# Patient Record
Sex: Male | Born: 1945
Health system: Southern US, Community
[De-identification: ages and names within clinical notes are randomized; demographics above are authoritative.]

## PROBLEM LIST (undated history)

## (undated) DIAGNOSIS — E119 Type 2 diabetes mellitus without complications: Secondary | ICD-10-CM

## (undated) DIAGNOSIS — I10 Essential (primary) hypertension: Secondary | ICD-10-CM

## (undated) DIAGNOSIS — I2510 Atherosclerotic heart disease of native coronary artery without angina pectoris: Secondary | ICD-10-CM

## (undated) DIAGNOSIS — E039 Hypothyroidism, unspecified: Secondary | ICD-10-CM

## (undated) DIAGNOSIS — C6290 Malignant neoplasm of unspecified testis, unspecified whether descended or undescended: Secondary | ICD-10-CM

## (undated) DIAGNOSIS — E7800 Pure hypercholesterolemia, unspecified: Secondary | ICD-10-CM

## (undated) HISTORY — DX: Malignant neoplasm of unspecified testis, unspecified whether descended or undescended: C62.90

## (undated) HISTORY — PX: CORONARY ANGIOPLASTY WITH STENT PLACEMENT: SHX49

---

## 2003-12-17 ENCOUNTER — Other Ambulatory Visit: Payer: Self-pay

## 2004-08-25 ENCOUNTER — Ambulatory Visit: Payer: Self-pay | Admitting: Oncology

## 2004-08-26 ENCOUNTER — Ambulatory Visit: Payer: Self-pay | Admitting: Oncology

## 2004-09-22 ENCOUNTER — Ambulatory Visit: Payer: Self-pay | Admitting: Oncology

## 2005-01-07 ENCOUNTER — Ambulatory Visit: Payer: Self-pay | Admitting: Oncology

## 2005-01-20 ENCOUNTER — Ambulatory Visit: Payer: Self-pay | Admitting: Oncology

## 2005-01-26 ENCOUNTER — Ambulatory Visit: Payer: Self-pay

## 2005-06-03 ENCOUNTER — Ambulatory Visit: Payer: Self-pay | Admitting: Oncology

## 2005-06-22 ENCOUNTER — Ambulatory Visit: Payer: Self-pay | Admitting: Oncology

## 2005-08-30 ENCOUNTER — Ambulatory Visit: Payer: Self-pay | Admitting: Oncology

## 2005-09-08 ENCOUNTER — Ambulatory Visit: Payer: Self-pay | Admitting: Oncology

## 2005-09-17 ENCOUNTER — Ambulatory Visit: Payer: Self-pay | Admitting: Oncology

## 2005-09-22 ENCOUNTER — Ambulatory Visit: Payer: Self-pay | Admitting: Oncology

## 2005-10-05 ENCOUNTER — Ambulatory Visit: Payer: Self-pay | Admitting: Urology

## 2005-12-06 ENCOUNTER — Ambulatory Visit: Payer: Self-pay | Admitting: Oncology

## 2005-12-23 ENCOUNTER — Ambulatory Visit: Payer: Self-pay | Admitting: Oncology

## 2006-01-20 ENCOUNTER — Ambulatory Visit: Payer: Self-pay | Admitting: Oncology

## 2006-03-10 ENCOUNTER — Ambulatory Visit: Payer: Self-pay | Admitting: Oncology

## 2006-03-22 ENCOUNTER — Ambulatory Visit: Payer: Self-pay | Admitting: Oncology

## 2006-05-24 ENCOUNTER — Ambulatory Visit: Payer: Self-pay | Admitting: Oncology

## 2006-06-22 ENCOUNTER — Ambulatory Visit: Payer: Self-pay | Admitting: Oncology

## 2006-08-02 ENCOUNTER — Ambulatory Visit: Payer: Self-pay | Admitting: Oncology

## 2006-08-04 ENCOUNTER — Ambulatory Visit: Payer: Self-pay | Admitting: Oncology

## 2006-08-22 ENCOUNTER — Ambulatory Visit: Payer: Self-pay | Admitting: Oncology

## 2006-11-03 ENCOUNTER — Ambulatory Visit: Payer: Self-pay | Admitting: Oncology

## 2006-11-22 ENCOUNTER — Ambulatory Visit: Payer: Self-pay | Admitting: Oncology

## 2007-01-24 ENCOUNTER — Ambulatory Visit: Payer: Self-pay | Admitting: Internal Medicine

## 2007-02-01 ENCOUNTER — Ambulatory Visit: Payer: Self-pay | Admitting: Internal Medicine

## 2007-02-21 ENCOUNTER — Ambulatory Visit: Payer: Self-pay | Admitting: Internal Medicine

## 2007-05-23 ENCOUNTER — Ambulatory Visit: Payer: Self-pay | Admitting: Internal Medicine

## 2007-05-30 ENCOUNTER — Ambulatory Visit: Payer: Self-pay | Admitting: Internal Medicine

## 2007-06-23 ENCOUNTER — Ambulatory Visit: Payer: Self-pay | Admitting: Internal Medicine

## 2007-08-23 ENCOUNTER — Ambulatory Visit: Payer: Self-pay | Admitting: Internal Medicine

## 2007-08-30 ENCOUNTER — Ambulatory Visit: Payer: Self-pay | Admitting: Internal Medicine

## 2007-09-23 ENCOUNTER — Ambulatory Visit: Payer: Self-pay | Admitting: Internal Medicine

## 2007-10-23 ENCOUNTER — Ambulatory Visit: Payer: Self-pay | Admitting: Internal Medicine

## 2007-11-23 ENCOUNTER — Ambulatory Visit: Payer: Self-pay | Admitting: Internal Medicine

## 2007-11-28 ENCOUNTER — Ambulatory Visit: Payer: Self-pay | Admitting: Internal Medicine

## 2007-12-24 ENCOUNTER — Ambulatory Visit: Payer: Self-pay | Admitting: Internal Medicine

## 2008-02-21 ENCOUNTER — Ambulatory Visit: Payer: Self-pay | Admitting: Internal Medicine

## 2008-02-27 ENCOUNTER — Ambulatory Visit: Payer: Self-pay | Admitting: Internal Medicine

## 2008-03-22 ENCOUNTER — Ambulatory Visit: Payer: Self-pay | Admitting: Internal Medicine

## 2008-05-22 ENCOUNTER — Ambulatory Visit: Payer: Self-pay | Admitting: Internal Medicine

## 2008-06-04 ENCOUNTER — Ambulatory Visit: Payer: Self-pay | Admitting: Internal Medicine

## 2008-06-22 ENCOUNTER — Ambulatory Visit: Payer: Self-pay | Admitting: Internal Medicine

## 2008-12-23 ENCOUNTER — Ambulatory Visit: Payer: Self-pay | Admitting: Internal Medicine

## 2009-01-08 ENCOUNTER — Ambulatory Visit: Payer: Self-pay | Admitting: Internal Medicine

## 2009-01-20 ENCOUNTER — Ambulatory Visit: Payer: Self-pay | Admitting: Internal Medicine

## 2009-06-22 ENCOUNTER — Ambulatory Visit: Payer: Self-pay | Admitting: Internal Medicine

## 2009-07-08 ENCOUNTER — Ambulatory Visit: Payer: Self-pay | Admitting: Internal Medicine

## 2009-07-23 ENCOUNTER — Ambulatory Visit: Payer: Self-pay | Admitting: Internal Medicine

## 2009-12-23 ENCOUNTER — Ambulatory Visit: Payer: Self-pay | Admitting: Internal Medicine

## 2010-01-09 ENCOUNTER — Ambulatory Visit: Payer: Self-pay | Admitting: Internal Medicine

## 2010-01-20 ENCOUNTER — Ambulatory Visit: Payer: Self-pay | Admitting: Internal Medicine

## 2010-02-20 ENCOUNTER — Ambulatory Visit: Payer: Self-pay | Admitting: Internal Medicine

## 2010-07-23 ENCOUNTER — Ambulatory Visit: Payer: Self-pay | Admitting: Internal Medicine

## 2010-08-14 ENCOUNTER — Ambulatory Visit: Payer: Self-pay | Admitting: Internal Medicine

## 2010-08-22 ENCOUNTER — Ambulatory Visit: Payer: Self-pay | Admitting: Internal Medicine

## 2010-08-28 LAB — AFP TUMOR MARKER: AFP-Tumor Marker: 1.5 ng/mL (ref 0.0–8.3)

## 2010-09-18 ENCOUNTER — Ambulatory Visit: Payer: Self-pay

## 2010-09-25 ENCOUNTER — Ambulatory Visit: Payer: Self-pay

## 2010-11-20 ENCOUNTER — Ambulatory Visit: Payer: Self-pay | Admitting: Internal Medicine

## 2010-11-22 ENCOUNTER — Ambulatory Visit: Payer: Self-pay | Admitting: Internal Medicine

## 2010-12-23 ENCOUNTER — Ambulatory Visit: Payer: Self-pay | Admitting: Internal Medicine

## 2011-05-24 ENCOUNTER — Emergency Department: Payer: Self-pay | Admitting: Emergency Medicine

## 2011-05-28 ENCOUNTER — Ambulatory Visit: Payer: Self-pay | Admitting: Internal Medicine

## 2011-05-29 LAB — AFP TUMOR MARKER: AFP-Tumor Marker: 1.5 ng/mL (ref 0.0–8.3)

## 2011-05-29 LAB — BETA HCG QUANT (REF LAB)

## 2011-06-23 ENCOUNTER — Ambulatory Visit: Payer: Self-pay | Admitting: Internal Medicine

## 2011-07-27 ENCOUNTER — Encounter: Payer: Self-pay | Admitting: Orthopedic Surgery

## 2011-08-23 ENCOUNTER — Encounter: Payer: Self-pay | Admitting: Orthopedic Surgery

## 2011-08-27 ENCOUNTER — Ambulatory Visit: Payer: Self-pay | Admitting: Orthopedic Surgery

## 2011-09-23 ENCOUNTER — Encounter: Payer: Self-pay | Admitting: Orthopedic Surgery

## 2011-12-03 ENCOUNTER — Ambulatory Visit: Payer: Self-pay | Admitting: Internal Medicine

## 2011-12-03 LAB — LACTATE DEHYDROGENASE: LDH: 159 U/L (ref 87–241)

## 2011-12-03 LAB — COMPREHENSIVE METABOLIC PANEL
Alkaline Phosphatase: 83 U/L (ref 50–136)
Anion Gap: 7 (ref 7–16)
BUN: 14 mg/dL (ref 7–18)
Calcium, Total: 8.9 mg/dL (ref 8.5–10.1)
Chloride: 104 mmol/L (ref 98–107)
Co2: 29 mmol/L (ref 21–32)
EGFR (African American): >60
EGFR (Non-African Amer.): >60
Osmolality: 280 (ref 275–301)
Potassium: 4.1 mmol/L (ref 3.5–5.1)
SGOT(AST): 17 U/L (ref 15–37)
SGPT (ALT): 23 U/L

## 2011-12-03 LAB — CBC CANCER CENTER
Basophil #: 0 x10 3/mm (ref 0.0–0.1)
Basophil %: 0.4 %
Eosinophil %: 2.2 %
HCT: 39.6 % — ABNORMAL LOW (ref 40.0–52.0)
HGB: 13.6 g/dL (ref 13.0–18.0)
MCH: 31.3 pg (ref 26.0–34.0)
MCV: 91 fL (ref 80–100)
Monocyte #: 0.5 x10 3/mm (ref 0.0–0.7)
Neutrophil #: 4.7 x10 3/mm (ref 1.4–6.5)
Neutrophil %: 64.6 %
RBC: 4.35 10*6/uL — ABNORMAL LOW (ref 4.40–5.90)
RDW: 14.2 % (ref 11.5–14.5)
WBC: 7.3 x10 3/mm (ref 3.8–10.6)

## 2011-12-04 LAB — BETA HCG QUANT (REF LAB)

## 2011-12-24 ENCOUNTER — Ambulatory Visit: Payer: Self-pay | Admitting: Internal Medicine

## 2014-11-06 ENCOUNTER — Ambulatory Visit: Payer: Self-pay | Admitting: Hematology and Oncology

## 2014-11-20 ENCOUNTER — Ambulatory Visit: Payer: Self-pay | Admitting: Hematology and Oncology

## 2014-11-20 LAB — COMPREHENSIVE METABOLIC PANEL
ALBUMIN: 3.6 g/dL (ref 3.4–5.0)
ALK PHOS: 78 U/L
AST: 25 U/L (ref 15–37)
Anion Gap: 9 (ref 7–16)
BILIRUBIN TOTAL: 0.5 mg/dL (ref 0.2–1.0)
BUN: 11 mg/dL (ref 7–18)
Calcium, Total: 8.4 mg/dL — ABNORMAL LOW (ref 8.5–10.1)
Chloride: 101 mmol/L (ref 98–107)
Co2: 30 mmol/L (ref 21–32)
Creatinine: 0.9 mg/dL (ref 0.60–1.30)
EGFR (African American): >60
EGFR (Non-African Amer.): >60
Glucose: 148 mg/dL — ABNORMAL HIGH (ref 65–99)
Osmolality: 282 (ref 275–301)
POTASSIUM: 4 mmol/L (ref 3.5–5.1)
SGPT (ALT): 36 U/L
Sodium: 140 mmol/L (ref 136–145)
Total Protein: 7.4 g/dL (ref 6.4–8.2)

## 2014-11-20 LAB — LACTATE DEHYDROGENASE: LDH: 160 U/L (ref 85–241)

## 2014-11-20 LAB — CBC CANCER CENTER
Basophil #: 0 x10 3/mm (ref 0.0–0.1)
Basophil %: 0.7 %
Eosinophil #: 0.2 x10 3/mm (ref 0.0–0.7)
Eosinophil %: 3.7 %
HCT: 41.4 % (ref 40.0–52.0)
HGB: 13.5 g/dL (ref 13.0–18.0)
LYMPHS ABS: 2.1 x10 3/mm (ref 1.0–3.6)
Lymphocyte %: 33 %
MCH: 29.5 pg (ref 26.0–34.0)
MCHC: 32.6 g/dL (ref 32.0–36.0)
MCV: 91 fL (ref 80–100)
MONO ABS: 0.7 x10 3/mm (ref 0.2–1.0)
Monocyte %: 10.4 %
NEUTROS ABS: 3.3 x10 3/mm (ref 1.4–6.5)
Neutrophil %: 52.2 %
Platelet: 203 x10 3/mm (ref 150–440)
RBC: 4.57 10*6/uL (ref 4.40–5.90)
RDW: 14.5 % (ref 11.5–14.5)
WBC: 6.3 x10 3/mm (ref 3.8–10.6)

## 2014-11-20 LAB — TSH: Thyroid Stimulating Horm: 5.04 u[IU]/mL — ABNORMAL HIGH

## 2014-11-22 ENCOUNTER — Ambulatory Visit: Payer: Self-pay | Admitting: Hematology and Oncology

## 2014-11-22 LAB — PSA: PSA: 0.5 ng/mL (ref 0.0–4.0)

## 2014-11-22 LAB — AFP TUMOR MARKER: AFP-Tumor Marker: 1.9 ng/mL (ref 0.0–8.3)

## 2014-11-22 LAB — BETA HCG QUANT (REF LAB)

## 2014-12-23 ENCOUNTER — Ambulatory Visit: Payer: Self-pay | Admitting: Hematology and Oncology

## 2015-09-25 DIAGNOSIS — H5210 Myopia, unspecified eye: Secondary | ICD-10-CM | POA: Diagnosis not present

## 2015-09-25 DIAGNOSIS — H524 Presbyopia: Secondary | ICD-10-CM | POA: Diagnosis not present

## 2015-11-06 DIAGNOSIS — E039 Hypothyroidism, unspecified: Secondary | ICD-10-CM | POA: Diagnosis not present

## 2015-11-06 DIAGNOSIS — I10 Essential (primary) hypertension: Secondary | ICD-10-CM | POA: Diagnosis not present

## 2015-11-06 DIAGNOSIS — E119 Type 2 diabetes mellitus without complications: Secondary | ICD-10-CM | POA: Diagnosis not present

## 2015-11-06 DIAGNOSIS — I2510 Atherosclerotic heart disease of native coronary artery without angina pectoris: Secondary | ICD-10-CM | POA: Diagnosis not present

## 2015-11-06 DIAGNOSIS — J418 Mixed simple and mucopurulent chronic bronchitis: Secondary | ICD-10-CM | POA: Diagnosis not present

## 2015-11-13 DIAGNOSIS — I10 Essential (primary) hypertension: Secondary | ICD-10-CM | POA: Diagnosis not present

## 2015-11-13 DIAGNOSIS — E119 Type 2 diabetes mellitus without complications: Secondary | ICD-10-CM | POA: Diagnosis not present

## 2015-11-13 DIAGNOSIS — E7800 Pure hypercholesterolemia, unspecified: Secondary | ICD-10-CM | POA: Diagnosis not present

## 2015-11-13 DIAGNOSIS — I2510 Atherosclerotic heart disease of native coronary artery without angina pectoris: Secondary | ICD-10-CM | POA: Diagnosis not present

## 2015-11-13 DIAGNOSIS — J418 Mixed simple and mucopurulent chronic bronchitis: Secondary | ICD-10-CM | POA: Diagnosis not present

## 2016-04-21 DIAGNOSIS — E119 Type 2 diabetes mellitus without complications: Secondary | ICD-10-CM | POA: Diagnosis not present

## 2016-04-21 DIAGNOSIS — I2510 Atherosclerotic heart disease of native coronary artery without angina pectoris: Secondary | ICD-10-CM | POA: Diagnosis not present

## 2016-04-21 DIAGNOSIS — E7800 Pure hypercholesterolemia, unspecified: Secondary | ICD-10-CM | POA: Diagnosis not present

## 2016-04-21 DIAGNOSIS — J418 Mixed simple and mucopurulent chronic bronchitis: Secondary | ICD-10-CM | POA: Diagnosis not present

## 2016-04-21 DIAGNOSIS — I10 Essential (primary) hypertension: Secondary | ICD-10-CM | POA: Diagnosis not present

## 2016-04-28 DIAGNOSIS — E7800 Pure hypercholesterolemia, unspecified: Secondary | ICD-10-CM | POA: Diagnosis not present

## 2016-04-28 DIAGNOSIS — E119 Type 2 diabetes mellitus without complications: Secondary | ICD-10-CM | POA: Diagnosis not present

## 2016-04-28 DIAGNOSIS — J418 Mixed simple and mucopurulent chronic bronchitis: Secondary | ICD-10-CM | POA: Diagnosis not present

## 2016-04-28 DIAGNOSIS — I10 Essential (primary) hypertension: Secondary | ICD-10-CM | POA: Diagnosis not present

## 2016-04-28 DIAGNOSIS — J432 Centrilobular emphysema: Secondary | ICD-10-CM | POA: Diagnosis not present

## 2016-04-28 DIAGNOSIS — R739 Hyperglycemia, unspecified: Secondary | ICD-10-CM | POA: Diagnosis not present

## 2016-04-28 DIAGNOSIS — I2510 Atherosclerotic heart disease of native coronary artery without angina pectoris: Secondary | ICD-10-CM | POA: Diagnosis not present

## 2016-04-28 DIAGNOSIS — Z0000 Encounter for general adult medical examination without abnormal findings: Secondary | ICD-10-CM | POA: Diagnosis not present

## 2016-04-28 DIAGNOSIS — Z6834 Body mass index (BMI) 34.0-34.9, adult: Secondary | ICD-10-CM | POA: Diagnosis not present

## 2016-06-29 DIAGNOSIS — D225 Melanocytic nevi of trunk: Secondary | ICD-10-CM | POA: Diagnosis not present

## 2016-06-29 DIAGNOSIS — Z85828 Personal history of other malignant neoplasm of skin: Secondary | ICD-10-CM | POA: Diagnosis not present

## 2016-06-29 DIAGNOSIS — D2272 Melanocytic nevi of left lower limb, including hip: Secondary | ICD-10-CM | POA: Diagnosis not present

## 2016-06-29 DIAGNOSIS — D2261 Melanocytic nevi of right upper limb, including shoulder: Secondary | ICD-10-CM | POA: Diagnosis not present

## 2016-06-29 DIAGNOSIS — L570 Actinic keratosis: Secondary | ICD-10-CM | POA: Diagnosis not present

## 2016-06-29 DIAGNOSIS — X32XXXA Exposure to sunlight, initial encounter: Secondary | ICD-10-CM | POA: Diagnosis not present

## 2016-08-23 DIAGNOSIS — I10 Essential (primary) hypertension: Secondary | ICD-10-CM | POA: Diagnosis not present

## 2016-08-23 DIAGNOSIS — E7800 Pure hypercholesterolemia, unspecified: Secondary | ICD-10-CM | POA: Diagnosis not present

## 2016-08-23 DIAGNOSIS — E119 Type 2 diabetes mellitus without complications: Secondary | ICD-10-CM | POA: Diagnosis not present

## 2016-08-23 DIAGNOSIS — R739 Hyperglycemia, unspecified: Secondary | ICD-10-CM | POA: Diagnosis not present

## 2016-08-23 DIAGNOSIS — J418 Mixed simple and mucopurulent chronic bronchitis: Secondary | ICD-10-CM | POA: Diagnosis not present

## 2016-08-23 DIAGNOSIS — I2510 Atherosclerotic heart disease of native coronary artery without angina pectoris: Secondary | ICD-10-CM | POA: Diagnosis not present

## 2016-08-23 DIAGNOSIS — Z0000 Encounter for general adult medical examination without abnormal findings: Secondary | ICD-10-CM | POA: Diagnosis not present

## 2016-08-30 DIAGNOSIS — R0602 Shortness of breath: Secondary | ICD-10-CM | POA: Diagnosis not present

## 2016-08-30 DIAGNOSIS — C6211 Malignant neoplasm of descended right testis: Secondary | ICD-10-CM | POA: Diagnosis not present

## 2016-08-30 DIAGNOSIS — E119 Type 2 diabetes mellitus without complications: Secondary | ICD-10-CM | POA: Diagnosis not present

## 2016-08-30 DIAGNOSIS — I10 Essential (primary) hypertension: Secondary | ICD-10-CM | POA: Diagnosis not present

## 2016-08-30 DIAGNOSIS — E7800 Pure hypercholesterolemia, unspecified: Secondary | ICD-10-CM | POA: Diagnosis not present

## 2016-08-30 DIAGNOSIS — I2510 Atherosclerotic heart disease of native coronary artery without angina pectoris: Secondary | ICD-10-CM | POA: Diagnosis not present

## 2016-09-14 DIAGNOSIS — R0602 Shortness of breath: Secondary | ICD-10-CM | POA: Diagnosis not present

## 2016-09-14 DIAGNOSIS — I10 Essential (primary) hypertension: Secondary | ICD-10-CM | POA: Diagnosis not present

## 2016-09-14 DIAGNOSIS — J410 Simple chronic bronchitis: Secondary | ICD-10-CM | POA: Diagnosis not present

## 2016-09-14 DIAGNOSIS — I493 Ventricular premature depolarization: Secondary | ICD-10-CM | POA: Diagnosis not present

## 2016-09-14 DIAGNOSIS — Z9889 Other specified postprocedural states: Secondary | ICD-10-CM | POA: Diagnosis not present

## 2016-09-14 DIAGNOSIS — I2510 Atherosclerotic heart disease of native coronary artery without angina pectoris: Secondary | ICD-10-CM | POA: Diagnosis not present

## 2016-09-14 DIAGNOSIS — R079 Chest pain, unspecified: Secondary | ICD-10-CM | POA: Diagnosis not present

## 2016-10-06 DIAGNOSIS — R0602 Shortness of breath: Secondary | ICD-10-CM | POA: Diagnosis not present

## 2016-10-06 DIAGNOSIS — R079 Chest pain, unspecified: Secondary | ICD-10-CM | POA: Diagnosis not present

## 2016-10-06 DIAGNOSIS — I2510 Atherosclerotic heart disease of native coronary artery without angina pectoris: Secondary | ICD-10-CM | POA: Diagnosis not present

## 2016-10-08 DIAGNOSIS — E119 Type 2 diabetes mellitus without complications: Secondary | ICD-10-CM | POA: Diagnosis not present

## 2016-10-08 DIAGNOSIS — I10 Essential (primary) hypertension: Secondary | ICD-10-CM | POA: Diagnosis not present

## 2016-10-08 DIAGNOSIS — R0602 Shortness of breath: Secondary | ICD-10-CM | POA: Diagnosis not present

## 2016-10-08 DIAGNOSIS — J410 Simple chronic bronchitis: Secondary | ICD-10-CM | POA: Diagnosis not present

## 2016-10-08 DIAGNOSIS — I493 Ventricular premature depolarization: Secondary | ICD-10-CM | POA: Diagnosis not present

## 2016-10-08 DIAGNOSIS — E7800 Pure hypercholesterolemia, unspecified: Secondary | ICD-10-CM | POA: Diagnosis not present

## 2016-10-08 DIAGNOSIS — Z9889 Other specified postprocedural states: Secondary | ICD-10-CM | POA: Diagnosis not present

## 2016-10-08 DIAGNOSIS — I2510 Atherosclerotic heart disease of native coronary artery without angina pectoris: Secondary | ICD-10-CM | POA: Diagnosis not present

## 2016-12-27 DIAGNOSIS — R0602 Shortness of breath: Secondary | ICD-10-CM | POA: Diagnosis not present

## 2016-12-27 DIAGNOSIS — E7800 Pure hypercholesterolemia, unspecified: Secondary | ICD-10-CM | POA: Diagnosis not present

## 2016-12-27 DIAGNOSIS — I10 Essential (primary) hypertension: Secondary | ICD-10-CM | POA: Diagnosis not present

## 2016-12-27 DIAGNOSIS — I2510 Atherosclerotic heart disease of native coronary artery without angina pectoris: Secondary | ICD-10-CM | POA: Diagnosis not present

## 2016-12-27 DIAGNOSIS — E119 Type 2 diabetes mellitus without complications: Secondary | ICD-10-CM | POA: Diagnosis not present

## 2016-12-27 DIAGNOSIS — C6211 Malignant neoplasm of descended right testis: Secondary | ICD-10-CM | POA: Diagnosis not present

## 2017-01-03 DIAGNOSIS — I2510 Atherosclerotic heart disease of native coronary artery without angina pectoris: Secondary | ICD-10-CM | POA: Diagnosis not present

## 2017-01-03 DIAGNOSIS — E7800 Pure hypercholesterolemia, unspecified: Secondary | ICD-10-CM | POA: Diagnosis not present

## 2017-01-03 DIAGNOSIS — I493 Ventricular premature depolarization: Secondary | ICD-10-CM | POA: Diagnosis not present

## 2017-01-03 DIAGNOSIS — R079 Chest pain, unspecified: Secondary | ICD-10-CM | POA: Diagnosis not present

## 2017-01-03 DIAGNOSIS — R0602 Shortness of breath: Secondary | ICD-10-CM | POA: Diagnosis not present

## 2017-01-03 DIAGNOSIS — Z9889 Other specified postprocedural states: Secondary | ICD-10-CM | POA: Diagnosis not present

## 2017-01-03 DIAGNOSIS — Z0000 Encounter for general adult medical examination without abnormal findings: Secondary | ICD-10-CM | POA: Diagnosis not present

## 2017-01-03 DIAGNOSIS — E119 Type 2 diabetes mellitus without complications: Secondary | ICD-10-CM | POA: Diagnosis not present

## 2017-01-03 DIAGNOSIS — I10 Essential (primary) hypertension: Secondary | ICD-10-CM | POA: Diagnosis not present

## 2017-01-03 DIAGNOSIS — C6211 Malignant neoplasm of descended right testis: Secondary | ICD-10-CM | POA: Diagnosis not present

## 2017-01-03 DIAGNOSIS — J410 Simple chronic bronchitis: Secondary | ICD-10-CM | POA: Diagnosis not present

## 2017-01-03 DIAGNOSIS — D649 Anemia, unspecified: Secondary | ICD-10-CM | POA: Diagnosis not present

## 2017-05-12 DIAGNOSIS — E119 Type 2 diabetes mellitus without complications: Secondary | ICD-10-CM | POA: Diagnosis not present

## 2017-05-12 DIAGNOSIS — E7800 Pure hypercholesterolemia, unspecified: Secondary | ICD-10-CM | POA: Diagnosis not present

## 2017-05-12 DIAGNOSIS — C6211 Malignant neoplasm of descended right testis: Secondary | ICD-10-CM | POA: Diagnosis not present

## 2017-05-12 DIAGNOSIS — I10 Essential (primary) hypertension: Secondary | ICD-10-CM | POA: Diagnosis not present

## 2017-05-12 DIAGNOSIS — I2510 Atherosclerotic heart disease of native coronary artery without angina pectoris: Secondary | ICD-10-CM | POA: Diagnosis not present

## 2017-05-12 DIAGNOSIS — Z0000 Encounter for general adult medical examination without abnormal findings: Secondary | ICD-10-CM | POA: Diagnosis not present

## 2017-05-19 DIAGNOSIS — I493 Ventricular premature depolarization: Secondary | ICD-10-CM | POA: Diagnosis not present

## 2017-05-19 DIAGNOSIS — I10 Essential (primary) hypertension: Secondary | ICD-10-CM | POA: Diagnosis not present

## 2017-05-19 DIAGNOSIS — I2510 Atherosclerotic heart disease of native coronary artery without angina pectoris: Secondary | ICD-10-CM | POA: Diagnosis not present

## 2017-05-19 DIAGNOSIS — E7800 Pure hypercholesterolemia, unspecified: Secondary | ICD-10-CM | POA: Diagnosis not present

## 2017-05-19 DIAGNOSIS — Z9889 Other specified postprocedural states: Secondary | ICD-10-CM | POA: Diagnosis not present

## 2017-05-19 DIAGNOSIS — E119 Type 2 diabetes mellitus without complications: Secondary | ICD-10-CM | POA: Diagnosis not present

## 2017-05-19 DIAGNOSIS — R0602 Shortness of breath: Secondary | ICD-10-CM | POA: Diagnosis not present

## 2017-05-19 DIAGNOSIS — C6211 Malignant neoplasm of descended right testis: Secondary | ICD-10-CM | POA: Diagnosis not present

## 2017-05-19 DIAGNOSIS — E039 Hypothyroidism, unspecified: Secondary | ICD-10-CM | POA: Diagnosis not present

## 2017-06-29 DIAGNOSIS — L570 Actinic keratosis: Secondary | ICD-10-CM | POA: Diagnosis not present

## 2017-06-29 DIAGNOSIS — D2272 Melanocytic nevi of left lower limb, including hip: Secondary | ICD-10-CM | POA: Diagnosis not present

## 2017-06-29 DIAGNOSIS — D2261 Melanocytic nevi of right upper limb, including shoulder: Secondary | ICD-10-CM | POA: Diagnosis not present

## 2017-06-29 DIAGNOSIS — X32XXXA Exposure to sunlight, initial encounter: Secondary | ICD-10-CM | POA: Diagnosis not present

## 2017-06-29 DIAGNOSIS — Z85828 Personal history of other malignant neoplasm of skin: Secondary | ICD-10-CM | POA: Diagnosis not present

## 2017-06-29 DIAGNOSIS — D225 Melanocytic nevi of trunk: Secondary | ICD-10-CM | POA: Diagnosis not present

## 2017-11-04 DIAGNOSIS — I10 Essential (primary) hypertension: Secondary | ICD-10-CM | POA: Diagnosis not present

## 2017-11-04 DIAGNOSIS — I2510 Atherosclerotic heart disease of native coronary artery without angina pectoris: Secondary | ICD-10-CM | POA: Diagnosis not present

## 2017-11-04 DIAGNOSIS — Z125 Encounter for screening for malignant neoplasm of prostate: Secondary | ICD-10-CM | POA: Diagnosis not present

## 2017-11-04 DIAGNOSIS — E119 Type 2 diabetes mellitus without complications: Secondary | ICD-10-CM | POA: Diagnosis not present

## 2017-11-04 DIAGNOSIS — C6211 Malignant neoplasm of descended right testis: Secondary | ICD-10-CM | POA: Diagnosis not present

## 2017-11-04 DIAGNOSIS — E7800 Pure hypercholesterolemia, unspecified: Secondary | ICD-10-CM | POA: Diagnosis not present

## 2017-11-11 DIAGNOSIS — E039 Hypothyroidism, unspecified: Secondary | ICD-10-CM | POA: Diagnosis not present

## 2017-11-11 DIAGNOSIS — J410 Simple chronic bronchitis: Secondary | ICD-10-CM | POA: Diagnosis not present

## 2017-11-11 DIAGNOSIS — D649 Anemia, unspecified: Secondary | ICD-10-CM | POA: Diagnosis not present

## 2017-11-11 DIAGNOSIS — Z9889 Other specified postprocedural states: Secondary | ICD-10-CM | POA: Diagnosis not present

## 2017-11-11 DIAGNOSIS — E7800 Pure hypercholesterolemia, unspecified: Secondary | ICD-10-CM | POA: Diagnosis not present

## 2017-11-11 DIAGNOSIS — Z0000 Encounter for general adult medical examination without abnormal findings: Secondary | ICD-10-CM | POA: Diagnosis not present

## 2017-11-11 DIAGNOSIS — I2510 Atherosclerotic heart disease of native coronary artery without angina pectoris: Secondary | ICD-10-CM | POA: Diagnosis not present

## 2017-11-11 DIAGNOSIS — R0602 Shortness of breath: Secondary | ICD-10-CM | POA: Diagnosis not present

## 2017-11-11 DIAGNOSIS — E119 Type 2 diabetes mellitus without complications: Secondary | ICD-10-CM | POA: Diagnosis not present

## 2017-11-11 DIAGNOSIS — I493 Ventricular premature depolarization: Secondary | ICD-10-CM | POA: Diagnosis not present

## 2017-11-11 DIAGNOSIS — I10 Essential (primary) hypertension: Secondary | ICD-10-CM | POA: Diagnosis not present

## 2018-05-05 DIAGNOSIS — D649 Anemia, unspecified: Secondary | ICD-10-CM | POA: Diagnosis not present

## 2018-05-05 DIAGNOSIS — E7800 Pure hypercholesterolemia, unspecified: Secondary | ICD-10-CM | POA: Diagnosis not present

## 2018-05-05 DIAGNOSIS — I10 Essential (primary) hypertension: Secondary | ICD-10-CM | POA: Diagnosis not present

## 2018-05-05 DIAGNOSIS — E039 Hypothyroidism, unspecified: Secondary | ICD-10-CM | POA: Diagnosis not present

## 2018-05-05 DIAGNOSIS — E119 Type 2 diabetes mellitus without complications: Secondary | ICD-10-CM | POA: Diagnosis not present

## 2018-05-05 DIAGNOSIS — J410 Simple chronic bronchitis: Secondary | ICD-10-CM | POA: Diagnosis not present

## 2018-05-15 DIAGNOSIS — E119 Type 2 diabetes mellitus without complications: Secondary | ICD-10-CM | POA: Diagnosis not present

## 2018-05-15 DIAGNOSIS — E039 Hypothyroidism, unspecified: Secondary | ICD-10-CM | POA: Diagnosis not present

## 2018-05-15 DIAGNOSIS — E7800 Pure hypercholesterolemia, unspecified: Secondary | ICD-10-CM | POA: Diagnosis not present

## 2018-05-15 DIAGNOSIS — I493 Ventricular premature depolarization: Secondary | ICD-10-CM | POA: Diagnosis not present

## 2018-05-15 DIAGNOSIS — I2510 Atherosclerotic heart disease of native coronary artery without angina pectoris: Secondary | ICD-10-CM | POA: Diagnosis not present

## 2018-05-15 DIAGNOSIS — Z9889 Other specified postprocedural states: Secondary | ICD-10-CM | POA: Diagnosis not present

## 2018-05-15 DIAGNOSIS — J410 Simple chronic bronchitis: Secondary | ICD-10-CM | POA: Diagnosis not present

## 2018-05-15 DIAGNOSIS — Z0000 Encounter for general adult medical examination without abnormal findings: Secondary | ICD-10-CM | POA: Diagnosis not present

## 2018-05-15 DIAGNOSIS — I10 Essential (primary) hypertension: Secondary | ICD-10-CM | POA: Diagnosis not present

## 2018-05-15 DIAGNOSIS — C6211 Malignant neoplasm of descended right testis: Secondary | ICD-10-CM | POA: Diagnosis not present

## 2018-05-15 DIAGNOSIS — R0602 Shortness of breath: Secondary | ICD-10-CM | POA: Diagnosis not present

## 2018-06-15 ENCOUNTER — Inpatient Hospital Stay
Admission: EM | Admit: 2018-06-15 | Discharge: 2018-06-17 | DRG: 246 | Disposition: A | Discharge status: Home / Self Care | Payer: Medicare HMO | Attending: Internal Medicine | Admitting: Internal Medicine

## 2018-06-15 ENCOUNTER — Other Ambulatory Visit: Payer: Self-pay

## 2018-06-15 ENCOUNTER — Encounter: Payer: Self-pay | Admitting: Emergency Medicine

## 2018-06-15 ENCOUNTER — Emergency Department: Payer: Medicare HMO

## 2018-06-15 DIAGNOSIS — Z823 Family history of stroke: Secondary | ICD-10-CM | POA: Diagnosis not present

## 2018-06-15 DIAGNOSIS — R0789 Other chest pain: Secondary | ICD-10-CM | POA: Diagnosis not present

## 2018-06-15 DIAGNOSIS — Z8249 Family history of ischemic heart disease and other diseases of the circulatory system: Secondary | ICD-10-CM

## 2018-06-15 DIAGNOSIS — Z7982 Long term (current) use of aspirin: Secondary | ICD-10-CM

## 2018-06-15 DIAGNOSIS — E039 Hypothyroidism, unspecified: Secondary | ICD-10-CM | POA: Diagnosis present

## 2018-06-15 DIAGNOSIS — Z79899 Other long term (current) drug therapy: Secondary | ICD-10-CM

## 2018-06-15 DIAGNOSIS — Z7984 Long term (current) use of oral hypoglycemic drugs: Secondary | ICD-10-CM

## 2018-06-15 DIAGNOSIS — I200 Unstable angina: Secondary | ICD-10-CM | POA: Diagnosis not present

## 2018-06-15 DIAGNOSIS — Y831 Surgical operation with implant of artificial internal device as the cause of abnormal reaction of the patient, or of later complication, without mention of misadventure at the time of the procedure: Secondary | ICD-10-CM | POA: Diagnosis present

## 2018-06-15 DIAGNOSIS — Z87891 Personal history of nicotine dependence: Secondary | ICD-10-CM

## 2018-06-15 DIAGNOSIS — I2510 Atherosclerotic heart disease of native coronary artery without angina pectoris: Secondary | ICD-10-CM | POA: Diagnosis not present

## 2018-06-15 DIAGNOSIS — E785 Hyperlipidemia, unspecified: Secondary | ICD-10-CM | POA: Diagnosis not present

## 2018-06-15 DIAGNOSIS — Z79890 Hormone replacement therapy (postmenopausal): Secondary | ICD-10-CM | POA: Diagnosis not present

## 2018-06-15 DIAGNOSIS — I222 Subsequent non-ST elevation (NSTEMI) myocardial infarction: Secondary | ICD-10-CM | POA: Diagnosis not present

## 2018-06-15 DIAGNOSIS — Z955 Presence of coronary angioplasty implant and graft: Secondary | ICD-10-CM | POA: Diagnosis not present

## 2018-06-15 DIAGNOSIS — I10 Essential (primary) hypertension: Secondary | ICD-10-CM | POA: Diagnosis present

## 2018-06-15 DIAGNOSIS — I214 Non-ST elevation (NSTEMI) myocardial infarction: Secondary | ICD-10-CM | POA: Diagnosis not present

## 2018-06-15 DIAGNOSIS — Z9889 Other specified postprocedural states: Secondary | ICD-10-CM | POA: Diagnosis not present

## 2018-06-15 DIAGNOSIS — Z83518 Family history of other specified eye disorder: Secondary | ICD-10-CM

## 2018-06-15 DIAGNOSIS — E119 Type 2 diabetes mellitus without complications: Secondary | ICD-10-CM | POA: Diagnosis present

## 2018-06-15 DIAGNOSIS — T82855A Stenosis of coronary artery stent, initial encounter: Secondary | ICD-10-CM | POA: Diagnosis not present

## 2018-06-15 DIAGNOSIS — I493 Ventricular premature depolarization: Secondary | ICD-10-CM | POA: Diagnosis not present

## 2018-06-15 DIAGNOSIS — R079 Chest pain, unspecified: Secondary | ICD-10-CM | POA: Diagnosis not present

## 2018-06-15 DIAGNOSIS — Z9861 Coronary angioplasty status: Secondary | ICD-10-CM | POA: Diagnosis not present

## 2018-06-15 HISTORY — DX: Pure hypercholesterolemia, unspecified: E78.00

## 2018-06-15 HISTORY — DX: Atherosclerotic heart disease of native coronary artery without angina pectoris: I25.10

## 2018-06-15 HISTORY — DX: Essential (primary) hypertension: I10

## 2018-06-15 HISTORY — DX: Type 2 diabetes mellitus without complications: E11.9

## 2018-06-15 LAB — PROTIME-INR
INR: 0.91
Prothrombin Time: 12.2 seconds (ref 11.4–15.2)

## 2018-06-15 LAB — GLUCOSE, CAPILLARY
Glucose-Capillary: 177 mg/dL — ABNORMAL HIGH (ref 70–99)
Glucose-Capillary: 143 mg/dL — ABNORMAL HIGH (ref 70–99)
Glucose-Capillary: 155 mg/dL — ABNORMAL HIGH (ref 70–99)
Glucose-Capillary: 120 mg/dL — ABNORMAL HIGH (ref 70–99)

## 2018-06-15 LAB — BASIC METABOLIC PANEL
Anion gap: 8 (ref 5–15)
BUN: 21 mg/dL (ref 8–23)
CALCIUM: 9.3 mg/dL (ref 8.9–10.3)
CO2: 27 mmol/L (ref 22–32)
CREATININE: 0.9 mg/dL (ref 0.61–1.24)
Chloride: 103 mmol/L (ref 98–111)
GFR calc Af Amer: >60 mL/min (ref >60)
GFR calc non Af Amer: >60 mL/min (ref >60)
GLUCOSE: 168 mg/dL — AB (ref 70–99)
Potassium: 4.2 mmol/L (ref 3.5–5.1)
Sodium: 138 mmol/L (ref 135–145)

## 2018-06-15 LAB — CBC
HCT: 39.2 % — ABNORMAL LOW (ref 40.0–52.0)
HEMOGLOBIN: 13.3 g/dL (ref 13.0–18.0)
MCH: 30.2 pg (ref 26.0–34.0)
MCHC: 33.9 g/dL (ref 32.0–36.0)
MCV: 89.1 fL (ref 80.0–100.0)
Platelets: 207 10*3/uL (ref 150–440)
RBC: 4.4 MIL/uL (ref 4.40–5.90)
RDW: 14 % (ref 11.5–14.5)
WBC: 7.8 10*3/uL (ref 3.8–10.6)

## 2018-06-15 LAB — TROPONIN I
TROPONIN I: 0.3 ng/mL — AB (ref <0.03)
TROPONIN I: 0.29 ng/mL — AB (ref <0.03)
Troponin I: 0.17 ng/mL (ref <0.03)

## 2018-06-15 LAB — HEPARIN LEVEL (UNFRACTIONATED)
HEPARIN UNFRACTIONATED: 0.29 [IU]/mL — AB (ref 0.30–0.70)
Heparin Unfractionated: 0.35 IU/mL (ref 0.30–0.70)

## 2018-06-15 LAB — HEMOGLOBIN A1C
Hgb A1c MFr Bld: 8.8 % — ABNORMAL HIGH (ref 4.8–5.6)
Mean Plasma Glucose: 205.86 mg/dL

## 2018-06-15 LAB — MAGNESIUM: Magnesium: 1.9 mg/dL (ref 1.7–2.4)

## 2018-06-15 LAB — TSH: TSH: 6.431 u[IU]/mL — ABNORMAL HIGH (ref 0.350–4.500)

## 2018-06-15 LAB — APTT: APTT: 31 s (ref 24–36)

## 2018-06-15 MED ORDER — ACETAMINOPHEN 650 MG RE SUPP: 650.0000 mg | Freq: Four times a day (QID) | RECTAL | Status: DC | PRN

## 2018-06-15 MED ORDER — SODIUM CHLORIDE 0.9% FLUSH: 3.0000 mL | INTRAVENOUS | Status: DC | PRN

## 2018-06-15 MED ORDER — METOPROLOL SUCCINATE ER 50 MG PO TB24
50.0000 mg | ORAL_TABLET | Freq: Every day | ORAL | Status: DC
  Administered 2018-06-15: 50 mg via ORAL
  Filled 2018-06-15: qty 1

## 2018-06-15 MED ORDER — NITROGLYCERIN 2 % TD OINT
1.0000 [in_us] | TOPICAL_OINTMENT | Freq: Once | TRANSDERMAL | Status: AC
  Administered 2018-06-15: 1 [in_us] via TOPICAL
  Filled 2018-06-15: qty 1

## 2018-06-15 MED ORDER — METOPROLOL TARTRATE 25 MG PO TABS: 25.0000 mg | ORAL_TABLET | Freq: Two times a day (BID) | ORAL | Status: DC

## 2018-06-15 MED ORDER — ATORVASTATIN CALCIUM 20 MG PO TABS
40.0000 mg | ORAL_TABLET | Freq: Every day | ORAL | Status: DC
  Administered 2018-06-15: 40 mg via ORAL
  Filled 2018-06-15: qty 2

## 2018-06-15 MED ORDER — LOSARTAN POTASSIUM 50 MG PO TABS
50.0000 mg | ORAL_TABLET | Freq: Every day | ORAL | Status: DC
  Administered 2018-06-15: 50 mg via ORAL
  Filled 2018-06-15: qty 1

## 2018-06-15 MED ORDER — INSULIN ASPART 100 UNIT/ML ~~LOC~~ SOLN
0.0000 [IU] | Freq: Three times a day (TID) | SUBCUTANEOUS | Status: DC
  Administered 2018-06-15: 1 [IU] via SUBCUTANEOUS
  Administered 2018-06-15 (×2): 2 [IU] via SUBCUTANEOUS
  Administered 2018-06-16: 5 [IU] via SUBCUTANEOUS
  Administered 2018-06-17: 3 [IU] via SUBCUTANEOUS
  Filled 2018-06-15 (×5): qty 1

## 2018-06-15 MED ORDER — HYDRALAZINE HCL 20 MG/ML IJ SOLN
10.0000 mg | Freq: Four times a day (QID) | INTRAMUSCULAR | Status: DC | PRN
  Administered 2018-06-17 (×2): 10 mg via INTRAVENOUS
  Filled 2018-06-15 (×2): qty 1

## 2018-06-15 MED ORDER — HEPARIN (PORCINE) IN NACL 100-0.45 UNIT/ML-% IJ SOLN
1400.0000 [IU]/h | INTRAMUSCULAR | Status: DC
  Administered 2018-06-16: 1400 [IU]/h via INTRAVENOUS
  Filled 2018-06-15 (×2): qty 250

## 2018-06-15 MED ORDER — LEVOTHYROXINE SODIUM 75 MCG PO TABS
75.0000 ug | ORAL_TABLET | Freq: Every day | ORAL | Status: DC
  Administered 2018-06-16 – 2018-06-17 (×2): 75 ug via ORAL
  Filled 2018-06-15: qty 3
  Filled 2018-06-15 (×2): qty 1
  Filled 2018-06-15: qty 3

## 2018-06-15 MED ORDER — SODIUM CHLORIDE 0.9 % WEIGHT BASED INFUSION: 1.0000 mL/kg/h | INTRAVENOUS | Status: DC

## 2018-06-15 MED ORDER — ACETAMINOPHEN 325 MG PO TABS: 650.0000 mg | ORAL_TABLET | Freq: Four times a day (QID) | ORAL | Status: DC | PRN

## 2018-06-15 MED ORDER — SODIUM CHLORIDE 0.9 % WEIGHT BASED INFUSION
3.0000 mL/kg/h | INTRAVENOUS | Status: AC
  Administered 2018-06-16: 3 mL/kg/h via INTRAVENOUS

## 2018-06-15 MED ORDER — ONDANSETRON HCL 4 MG/2ML IJ SOLN
4.0000 mg | Freq: Four times a day (QID) | INTRAMUSCULAR | Status: DC | PRN
Start: 2018-06-15 — End: 2018-06-16

## 2018-06-15 MED ORDER — SODIUM CHLORIDE 0.9% FLUSH
3.0000 mL | Freq: Two times a day (BID) | INTRAVENOUS | Status: DC
  Administered 2018-06-15 (×2): 3 mL via INTRAVENOUS

## 2018-06-15 MED ORDER — SODIUM CHLORIDE 0.9 % IV SOLN: 250.0000 mL | INTRAVENOUS | Status: DC | PRN

## 2018-06-15 MED ORDER — DOCUSATE SODIUM 100 MG PO CAPS
100.0000 mg | ORAL_CAPSULE | Freq: Two times a day (BID) | ORAL | Status: DC
  Filled 2018-06-15 (×2): qty 1

## 2018-06-15 MED ORDER — HEPARIN SODIUM (PORCINE) 5000 UNIT/ML IJ SOLN
4000.0000 [IU] | Freq: Once | INTRAMUSCULAR | Status: AC
  Administered 2018-06-15: 4000 [IU] via INTRAVENOUS
  Filled 2018-06-15: qty 1

## 2018-06-15 MED ORDER — ASPIRIN 81 MG PO CHEW
81.0000 mg | CHEWABLE_TABLET | ORAL | Status: AC
  Filled 2018-06-15: qty 1

## 2018-06-15 MED ORDER — INSULIN ASPART 100 UNIT/ML ~~LOC~~ SOLN
0.0000 [IU] | Freq: Every day | SUBCUTANEOUS | Status: DC
  Administered 2018-06-16: 2 [IU] via SUBCUTANEOUS
  Filled 2018-06-15: qty 1

## 2018-06-15 MED ORDER — MORPHINE SULFATE (PF) 2 MG/ML IV SOLN
2.0000 mg | INTRAVENOUS | Status: DC | PRN
Start: 2018-06-15 — End: 2018-06-17
  Administered 2018-06-15 – 2018-06-17 (×3): 2 mg via INTRAVENOUS
  Filled 2018-06-15 (×3): qty 1

## 2018-06-15 MED ORDER — ASPIRIN EC 81 MG PO TBEC
81.0000 mg | DELAYED_RELEASE_TABLET | Freq: Every day | ORAL | Status: DC
  Administered 2018-06-15 – 2018-06-16 (×2): 81 mg via ORAL
  Filled 2018-06-15: qty 1

## 2018-06-15 MED ORDER — ASPIRIN 81 MG PO CHEW: 81.0000 mg | CHEWABLE_TABLET | Freq: Every day | ORAL | Status: DC

## 2018-06-15 MED ORDER — ONDANSETRON HCL 4 MG PO TABS
4.0000 mg | ORAL_TABLET | Freq: Four times a day (QID) | ORAL | Status: DC | PRN
Start: 2018-06-15 — End: 2018-06-16

## 2018-06-15 NOTE — ED Notes (Signed)
Awaiting Heparin from pharmacy.

## 2018-06-15 NOTE — H&P (Signed)
Jason Mclean. is an 72 y.o. male.   Chief Complaint: Chest pain HPI: The patient with past medical history of coronary artery disease status post PCI, diabetes and hypertension presents to the emergency department complaining of chest pain.  The patient admits that he has had pain off and on for the last few weeks.  Today he had pain while mowing his lawn albeit on his riding lawn more.  The patient took a few aspirin and rested which relieved the pain to some degree.  It was intermittent until this evening when the patient went up stairs and immediately felt a chest pressure and pain radiating down both arms.  He denies nausea, vomiting or diaphoresis.  In the emergency department he continued to have centralized chest pressure which prompted the emergency department staff to call hospitalist service for admission.  Past Medical History:  Diagnosis Date  . Coronary artery disease    s/p three coronary stents  . Diabetes mellitus without complication ([place])   . Hypercholesterolemia   . Hypertension     Past Surgical History:  Procedure Laterality Date  . CORONARY ANGIOPLASTY WITH STENT PLACEMENT      Family History  Problem Relation Age of Onset  . Strabismus Mother   . Stroke Mother   . CAD Father    Social History:  reports that he has quit smoking. He has never used smokeless tobacco. His alcohol and drug histories are not on file.  Allergies: No Known Allergies  Prior to Admission medications   Medication Sig Start Date End Date Taking? Authorizing Provider  aspirin EC 81 MG tablet Take 81 mg by mouth daily as needed.   Yes [provider]  atorvastatin (LIPITOR) 40 MG tablet Take 40 mg by mouth daily at 6 PM.   Yes [provider]  levothyroxine (SYNTHROID, LEVOTHROID) 75 MCG tablet TAKE 1 TABLET BY MOUTH ON AN EMPTY STOMACH W/A GLASS OF WATER AT LEAST 30-60 MIN BEFORE BREAKFAST. 03/28/18  Yes [provider]  losartan (COZAAR) 50 MG tablet Take 50 mg  by mouth daily. 05/29/18  Yes [provider]  metFORMIN (GLUCOPHAGE) 1000 MG tablet Take 1,000 mg by mouth 2 (two) times daily with a meal. 05/16/18  Yes [provider]  metoprolol succinate (TOPROL-XL) 50 MG 24 hr tablet Take 50 mg by mouth daily. 05/16/18  Yes [provider]  Multiple Vitamin (MULTIVITAMIN WITH MINERALS) TABS tablet Take 1 tablet by mouth daily.   Yes [provider]     Results for orders placed or performed during the hospital encounter of 06/15/18 (from the past 48 hour(s))  Basic metabolic panel     Status: Abnormal   Collection Time: 06/15/18  1:44 AM  Result Value Ref Range   Sodium 138 135 - 145 mmol/L   Potassium 4.2 3.5 - 5.1 mmol/L   Chloride 103 98 - 111 mmol/L   CO2 27 22 - 32 mmol/L   Glucose, Bld 168 (H) 70 - 99 mg/dL   BUN 21 8 - 23 mg/dL   Creatinine, Ser 0.90 0.61 - 1.24 mg/dL   Calcium 9.3 8.9 - 10.3 mg/dL   GFR calc non Af Amer >60 >60 mL/min   GFR calc Af Amer >60 >60 mL/min    Comment: (NOTE) The eGFR has been calculated using the CKD EPI equation. This calculation has not been validated in all clinical situations. eGFR's persistently <60 mL/min signify possible Chronic Kidney Disease.    Anion gap 8 5 -  15    Comment: Performed at [facility], [address] [place], [place] [address]  CBC     Status: Abnormal   Collection Time: 06/15/18  1:44 AM  Result Value Ref Range   WBC 7.8 3.8 - 10.6 K/uL   RBC 4.40 4.40 - 5.90 MIL/uL   Hemoglobin 13.3 13.0 - 18.0 g/dL   HCT 39.2 (L) 40.0 - 52.0 %   MCV 89.1 80.0 - 100.0 fL   MCH 30.2 26.0 - 34.0 pg   MCHC 33.9 32.0 - 36.0 g/dL   RDW 14.0 11.5 - 14.5 %   Platelets 207 150 - 440 K/uL    Comment: Performed at [facility], [address] [place], [place] [address]  Troponin I     Status: None   Collection Time: 06/15/18  1:44 AM  Result Value Ref Range   Troponin I <0.03 <0.03 ng/mL    Comment: Performed at [facility], [address], [place], [place] [address]  Magnesium     Status: None   Collection Time: 06/15/18  1:44 AM  Result Value Ref Range   Magnesium 1.9 1.7 - 2.4 mg/dL    Comment: Performed at [facility], [address] [place], [place] [address]  Protime-INR     Status: None   Collection Time: 06/15/18  1:44 AM  Result Value Ref Range   Prothrombin Time 12.2 11.4 - 15.2 seconds   INR 0.91     Comment: Performed at [facility], [address] [place], [place] [address]  APTT     Status: None   Collection Time: 06/15/18  1:44 AM  Result Value Ref Range   aPTT 31 24 - 36 seconds    Comment: Performed at [facility], [address] [place], [place] [address]  TSH     Status: Abnormal   Collection Time: 06/15/18  1:44 AM  Result Value Ref Range   TSH 6.431 (H) 0.350 - 4.500 uIU/mL    Comment: Performed by a 3rd Generation assay with a functional sensitivity of <=0.01 uIU/mL. Performed at [facility], [address] [place],  [address]    Dg Chest 2 View  Result Date: 06/15/2018 CLINICAL DATA:  Chest pain EXAM: CHEST - 2 VIEW COMPARISON:  05/24/2011 FINDINGS: Mild bronchitic changes at the bases. No focal opacity or pleural effusion. Borderline to mild cardiomegaly. No pneumothorax. IMPRESSION: 1. Mild bronchitic changes at the bases 2. Borderline to mild cardiomegaly. Electronically Signed   By: Donavan Foil M.D.   On: 06/15/2018 01:37    Review of Systems  Constitutional: Negative for chills and fever.  HENT: Negative for sore throat and tinnitus.   Eyes: Negative for blurred vision and redness.  Respiratory: Positive for shortness of breath. Negative for cough.   Cardiovascular: Positive for chest pain. Negative for palpitations, orthopnea and PND.  Gastrointestinal: Negative for abdominal pain, diarrhea, nausea and vomiting.  Genitourinary: Negative for dysuria, frequency and urgency.  Musculoskeletal: Negative for joint pain and  myalgias.  Skin: Negative for rash.       No lesions  Neurological: Negative for speech change, focal weakness and weakness.  Endo/Heme/Allergies: Does not bruise/bleed easily.       No temperature intolerance  Psychiatric/Behavioral: Negative for depression and suicidal ideas.    Blood pressure (!) 147/69, pulse (!) 58, temperature 98 F (36.7 C), temperature source Oral, resp. rate (!) 21, height _0  (1.753 m), weight 98.4 kg (217  lb), SpO2 96 %. Physical Exam  Constitutional: He is oriented to person, place, and time. He appears well-developed and well-nourished. No distress.  HENT:  Head: Normocephalic and atraumatic.  Mouth/Throat: Oropharynx is clear and moist.  Eyes: Pupils are equal, round, and reactive to light. Conjunctivae and EOM are normal. No scleral icterus.  Neck: Normal range of motion. Neck supple. No JVD present. No tracheal deviation present. No thyromegaly present.  Cardiovascular: Normal rate and normal heart sounds. Exam reveals no gallop and no friction rub.  No murmur heard. Respiratory: Effort normal and breath sounds normal. No respiratory distress.  GI: Soft. Bowel sounds are normal. He exhibits no distension. There is no tenderness.  Genitourinary:  Genitourinary Comments: Deferred  Musculoskeletal: Normal range of motion. He exhibits no edema.  Lymphadenopathy:    He has no cervical adenopathy.  Neurological: He is alert and oriented to person, place, and time. No cranial nerve deficit.  Skin: Skin is warm and dry. No rash noted. No erythema.  Psychiatric: He has a normal mood and affect. His behavior is normal. Judgment and thought content normal.     Assessment/Plan This is a 72 year old male admitted for NSTEMI. 1.  NSTEMI: Heparin drip, n.p.o., control heart rate with metoprolol.  Cardiology consulted for potential heart catheterization. 2.  CAD: Status post 3 stents in the past.  Last catheterization 2005.  Continue aspirin. Nitropaste applied  to the chest. 3.  Hypertension: Controlled; continue losartan. 4.  Diabetes mellitus type 2: Hold metformin; sliding scale insulin hospitalized 5.  Hyperlipidemia: Continue statin therapy 6.  Hypothyroidism: Continue Synthroid; check TSH 7.  DVT prophylaxis: There pubic anticoagulation Exline 8.  GI prophylaxis: None The patient is a full code.  Time spent on admission orders and patient care approximately 45 minutes   Harrie Foreman, MD 06/15/2018, 7:45 AM

## 2018-06-15 NOTE — Progress Notes (Signed)
Talked to Dr. Marcille Blanco about patient's complaints of 5/10 mid-sternal chest pressure radiating on his left arm. Patient has nitro patch on order, to give morphine 2 mg IV. Patient has scheduled cardiac cath tomorrow. RN will continue to monitor.

## 2018-06-15 NOTE — Progress Notes (Signed)
ANTICOAGULATION CONSULT NOTE - Initial Consult  Pharmacy Consult for heparin drip Indication: chest pain/ACS  No Known Allergies  Patient Measurements: Height: 5\' 9"  (175.3 cm) Weight: 217 lb (98.4 kg) IBW/kg (Calculated) : 70.7 Heparin Dosing Weight: 91 kg  Vital Signs: Temp: 98.3 F (36.8 C) (07/25 2053) Temp Source: Oral (07/25 1726) BP: 151/67 (07/25 2053) Pulse Rate: 59 (07/25 2053)  Labs: Recent Labs    06/15/18 0144 06/15/18 0802 06/15/18 1133 06/15/18 1328 06/15/18 1950  HGB 13.3  --   --   --   --   HCT 39.2*  --   --   --   --   PLT 207  --   --   --   --   APTT 31  --   --   --   --   LABPROT 12.2  --   --   --   --   INR 0.91  --   --   --   --   HEPARINUNFRC  --   --  0.35  --  0.29*  CREATININE 0.90  --   --   --   --   TROPONINI <0.03 0.17*  --  0.30* 0.29*    Estimated Creatinine Clearance: 88.4 mL/min (by C-G formula based on SCr of 0.9 mg/dL).   Medical History: Past Medical History:  Diagnosis Date  . Coronary artery disease    s/p three coronary stents  . Diabetes mellitus without complication ([place])   . Hypercholesterolemia   . Hypertension     Medications:  No anticoag in PTA meds  Assessment: Initial Trop < 0.03  Goal of Therapy:  Heparin level 0.3-0.7 units/ml Monitor platelets by anticoagulation protocol: Yes   Plan:  7/25 @ 1950 Heparin level subtherapeutic at 0.29. Will increase heparin drip to 1400 units/hr and recheck HL in 8 hours.  Pernell Dupre, PharmD, BCPS Clinical Pharmacist 06/15/2018 8:58 PM

## 2018-06-15 NOTE — ED Notes (Signed)
Lab called resulted a critical value, Admitting MD paged.

## 2018-06-15 NOTE — Progress Notes (Signed)
Updated Dr. Marcille Blanco about patient's that has increase 6/10, did stat EKG, then reassessed pain after 30 mins and patient asleep. RN will continue to monitor.

## 2018-06-15 NOTE — Consult Note (Signed)
Cardiology Consultation Note    Patient ID: Jason Correia., MRN: [id number], DOB/AGE: [birth year] 72 y.o. Admit date: 06/15/2018   Date of Consult: 06/15/2018 Primary Physician: Tracie Harrier, MD Primary Cardiologist: Dr. Saralyn Pilar  Chief Complaint: Chest pain Reason for Consultation: Chest pain/elevated troponin Requesting MD: Dr. Bridgett Larsson  HPI: Jason Dicenzo. is a 72 y.o. male with history of coronary artery disease status post PCI x3 with a stent in the ramus intermedius done in March of 01/10/1998, stent in the mid and distal RCA done in 2005, history of preserved LV function with a negative functional study in 2017 with an EF of 66%.  He is been treated with metoprolol succinate and losartan.  He is on atorvastatin with an LDL of 104.  He presents emergency room with complaints of chest discomfort.  His last heart cath was done 2005.  He was significantly hypertensive.  EKG showed sinus rhythm with nonspecific ST-T wave changes and PVCs.  Chest x-ray revealed bronchitic changes with no pulmonary edema.  There was borderline to mild cardiomegaly.  Initial serum troponin was normal subsequent value was 0.17.  Past Medical History:  Diagnosis Date  . Coronary artery disease    s/p three coronary stents  . Diabetes mellitus without complication ([place])   . Hypercholesterolemia   . Hypertension       Surgical History:  Past Surgical History:  Procedure Laterality Date  . CORONARY ANGIOPLASTY WITH STENT PLACEMENT       Home Meds: Prior to Admission medications   Medication Sig Start Date End Date Taking? Authorizing Provider  aspirin EC 81 MG tablet Take 81 mg by mouth daily as needed.   Yes [provider]  atorvastatin (LIPITOR) 40 MG tablet Take 40 mg by mouth daily at 6 PM.   Yes [provider]  levothyroxine (SYNTHROID, LEVOTHROID) 75 MCG tablet TAKE 1 TABLET BY MOUTH ON AN EMPTY STOMACH W/A GLASS OF WATER AT LEAST 30-60 MIN BEFORE BREAKFAST. 03/28/18  Yes  [provider]  losartan (COZAAR) 50 MG tablet Take 50 mg by mouth daily. 05/29/18  Yes [provider]  metFORMIN (GLUCOPHAGE) 1000 MG tablet Take 1,000 mg by mouth 2 (two) times daily with a meal. 05/16/18  Yes [provider]  metoprolol succinate (TOPROL-XL) 50 MG 24 hr tablet Take 50 mg by mouth daily. 05/16/18  Yes [provider]  Multiple Vitamin (MULTIVITAMIN WITH MINERALS) TABS tablet Take 1 tablet by mouth daily.   Yes [provider]    Inpatient Medications:  . [START ON 06/16/2018] aspirin  81 mg Oral Daily  . atorvastatin  40 mg Oral q1800  . docusate sodium  100 mg Oral BID  . insulin aspart  0-5 Units Subcutaneous QHS  . insulin aspart  0-9 Units Subcutaneous TID WC  . metoprolol tartrate  25 mg Oral BID   . heparin      Allergies: No Known Allergies  Social History   Socioeconomic History  . Marital status: Divorced    Spouse name: Not on file  . Number of children: Not on file  . Years of education: Not on file  . Highest education level: Not on file  Occupational History  . Not on file  Social Needs  . Financial resource strain: Not on file  . Food insecurity:    Worry: Not on file    Inability: Not on file  . Transportation needs:    Medical: Not on file  Non-medical: Not on file  Tobacco Use  . Smoking status: Former Research scientist (life sciences)  . Smokeless tobacco: Never Used  Substance and Sexual Activity  . Alcohol use: Not on file  . Drug use: Not on file  . Sexual activity: Not on file  Lifestyle  . Physical activity:    Days per week: Not on file    Minutes per session: Not on file  . Stress: Not on file  Relationships  . Social connections:    Talks on phone: Not on file    Gets together: Not on file    Attends religious service: Not on file    Active member of club or organization: Not on file    Attends meetings of clubs or organizations: Not on file    Relationship status: Not on file  . Intimate partner  violence:    Fear of current or ex partner: Not on file    Emotionally abused: Not on file    Physically abused: Not on file    Forced sexual activity: Not on file  Other Topics Concern  . Not on file  Social History Narrative  . Not on file     Family History  Problem Relation Age of Onset  . Strabismus Mother   . Stroke Mother   . CAD Father      Review of Systems: A 12-system review of systems was performed and is negative except as noted in the HPI.  Labs: Recent Labs    06/15/18 0144 06/15/18 0802  TROPONINI <0.03 0.17*   Lab Results  Component Value Date   WBC 7.8 06/15/2018   HGB 13.3 06/15/2018   HCT 39.2 (L) 06/15/2018   MCV 89.1 06/15/2018   PLT 207 06/15/2018    Recent Labs  Lab 06/15/18 0144  NA 138  K 4.2  CL 103  CO2 27  BUN 21  CREATININE 0.90  CALCIUM 9.3  GLUCOSE 168*   No results found for: CHOL, HDL, LDLCALC, TRIG No results found for: DDIMER  Radiology/Studies:  Dg Chest 2 View  Result Date: 06/15/2018 CLINICAL DATA:  Chest pain EXAM: CHEST - 2 VIEW COMPARISON:  05/24/2011 FINDINGS: Mild bronchitic changes at the bases. No focal opacity or pleural effusion. Borderline to mild cardiomegaly. No pneumothorax. IMPRESSION: 1. Mild bronchitic changes at the bases 2. Borderline to mild cardiomegaly. Electronically Signed   By: Donavan Foil M.D.   On: 06/15/2018 01:37    Wt Readings from Last 3 Encounters:  06/15/18 98.4 kg (217 lb)    EKG: Normal sinus rhythm with delayed R wave progression across the precordium.  Physical Exam:  Blood pressure (!) 181/79, pulse 64, temperature 98 F (36.7 C), temperature source Oral, resp. rate 15, height 5\' 9"  (1.753 m), weight 98.4 kg (217 lb), SpO2 99 %. Body mass index is 32.05 kg/m. General: Well developed, well nourished, in no acute distress. Head: Normocephalic, atraumatic, sclera non-icteric, no xanthomas, nares are without discharge.  Neck: Negative for carotid bruits. JVD not  elevated. Lungs: Clear bilaterally to auscultation without wheezes, rales, or rhonchi. Breathing is unlabored. Heart: RRR with S1 S2. No murmurs, rubs, or gallops appreciated. Abdomen: Soft, non-tender, non-distended with normoactive bowel sounds. No hepatomegaly. No rebound/guarding. No obvious abdominal masses. Msk:  Strength and tone appear normal for age. Extremities: No clubbing or cyanosis. No edema.  Distal pedal pulses are 2+ and equal bilaterally. Neuro: Alert and oriented X 3. No facial asymmetry. No focal deficit. Moves all extremities spontaneously. Psych:  Responds to questions appropriately with a normal affect.     Assessment and Plan  40-year-old male with history of PCI status post PCI of ramus intermedius in 1999 and stents in the mid and distal RCA in 2005.  Now admitted with chest pain.  Has mild serum troponin elevation thus far.  Pain is similar to angina.  We will continue to rule out for mi continue with current meds.  We will plan to proceed with left heart cath tomorrow to evaluate for evidence of progression of coronary disease to guide further therapy.  Patient can take clinically from this. Continue with asa, heparin, atorvastatin. further recs after cath.   Signed, Teodoro Spray MD 06/15/2018, 1:29 PM Pager: [phone]

## 2018-06-15 NOTE — Progress Notes (Signed)
ANTICOAGULATION CONSULT NOTE - Initial Consult  Pharmacy Consult for heparin drip Indication: chest pain/ACS  No Known Allergies  Patient Measurements: Height: 5\' 9"  (175.3 cm) Weight: 217 lb (98.4 kg) IBW/kg (Calculated) : 70.7 Heparin Dosing Weight: 91 kg  Vital Signs: Temp: 98 F (36.7 C) (07/25 0125) Temp Source: Oral (07/25 0125) BP: 183/81 (07/25 0300) Pulse Rate: 62 (07/25 0300)  Labs: Recent Labs    06/15/18 0144  HGB 13.3  HCT 39.2*  PLT 207  APTT 31  LABPROT 12.2  INR 0.91  CREATININE 0.90  TROPONINI <0.03    Estimated Creatinine Clearance: 88.4 mL/min (by C-G formula based on SCr of 0.9 mg/dL).   Medical History: Past Medical History:  Diagnosis Date  . Diabetes mellitus without complication ([place])   . Hypertension     Medications:  No anticoag in PTA meds  Assessment: Initial Trop < 0.03  Goal of Therapy:  Heparin level 0.3-0.7 units/ml Monitor platelets by anticoagulation protocol: Yes   Plan:  4000 unit bolus and initial rate of 1200 units/hr. First heparin level 6 hours after start of infusion.  Londell Noll S 06/15/2018,3:39 AM

## 2018-06-15 NOTE — ED Triage Notes (Addendum)
Pt to triage via w/c with no distress noted; c/o mid CP radiating down both arms; st pain began after mowing yard today; st pressure to chest for last several days that has been intermittent; pt reports his pain will go away and reoccur with any activity; denies any accomp symptoms; hx card stent placement

## 2018-06-15 NOTE — Progress Notes (Addendum)
Patient has no complaints of chest pain or shortness of breath. His vital signs are stable. Non-STEMI Continue current treatment and cardiac cath tomorrow per Dr. Ubaldo Glassing.  I discussed with the patient and RN.  Time spent about 20 minutes.

## 2018-06-15 NOTE — Progress Notes (Signed)
ANTICOAGULATION CONSULT NOTE - Initial Consult  Pharmacy Consult for heparin drip Indication: chest pain/ACS  No Known Allergies  Patient Measurements: Height: 5\' 9"  (175.3 cm) Weight: 217 lb (98.4 kg) IBW/kg (Calculated) : 70.7 Heparin Dosing Weight: 91 kg  Vital Signs: Temp: 98 F (36.7 C) (07/25 0125) Temp Source: Oral (07/25 0125) BP: 165/72 (07/25 1115) Pulse Rate: 56 (07/25 1115)  Labs: Recent Labs    06/15/18 0144 06/15/18 0802 06/15/18 1133  HGB 13.3  --   --   HCT 39.2*  --   --   PLT 207  --   --   APTT 31  --   --   LABPROT 12.2  --   --   INR 0.91  --   --   HEPARINUNFRC  --   --  0.35  CREATININE 0.90  --   --   TROPONINI <0.03 0.17*  --     Estimated Creatinine Clearance: 88.4 mL/min (by C-G formula based on SCr of 0.9 mg/dL).   Medical History: Past Medical History:  Diagnosis Date  . Coronary artery disease    s/p three coronary stents  . Diabetes mellitus without complication ([place])   . Hypercholesterolemia   . Hypertension     Medications:  No anticoag in PTA meds  Assessment: Initial Trop < 0.03  Goal of Therapy:  Heparin level 0.3-0.7 units/ml Monitor platelets by anticoagulation protocol: Yes   Plan:  Heparin level therapeutic at 0.35. Continue drip at 1200 units/hr and check confirmation level in 8 hours.  Ramond Dial, Pharm.D, BCPS Clinical Pharmacist  06/15/2018,12:10 PM

## 2018-06-15 NOTE — ED Notes (Signed)
BG is 143 mg/dL. Will administer sliding scale Insulin.

## 2018-06-15 NOTE — ED Provider Notes (Signed)
[facility] Emergency Department Provider Note  ____________________________________________   First MD Initiated Contact with Patient 06/15/18 0236     (approximate)  I have reviewed the triage vital signs and the nursing notes.   HISTORY  Chief Complaint Chest Pain    HPI Jason Mclean. is a 72 y.o. male with medical history as listed below who presents for evaluation of acute severe chest pain.  He reports that he normally is chest pain-free.  He last had a catheterization about 14 years ago where he received his second and third stents by Dr. Saralyn Pilar.  He had a reassuring clinic visit on that long ago when his blood pressure was about 935 systolic and he was not having any chest pain.  He states that over the last week he has been having episodes of chest pain lasting 15 to 20 minutes usually associated with exertion.  Rest seems to make it better.  It is central and feels like both a sharp pain and pressure.  Today he was doing some yard work outside and the pain came on it was severe and lasted for more than an hour.  It was radiating down both of his arms and was causing him to be somewhat short of breath.  He denies fever/chills, nausea, vomiting, abdominal pain, and dysuria.  He took a full dose aspirin prior to arrival in the emergency department.  He is feeling better now but still feels some mild chest pressure.   Past Medical History:  Diagnosis Date  . Coronary artery disease    s/p three coronary stents  . Diabetes mellitus without complication ([place])   . Hypercholesterolemia   . Hypertension     Patient Active Problem List   Diagnosis Date Noted  . NSTEMI (non-ST elevated myocardial infarction) ([place]) 06/15/2018    Past Surgical History:  Procedure Laterality Date  . CORONARY ANGIOPLASTY WITH STENT PLACEMENT      Prior to Admission medications   Medication Sig Start Date End Date Taking? Authorizing Provider  aspirin EC 81 MG  tablet Take 81 mg by mouth daily as needed.   Yes [provider]  atorvastatin (LIPITOR) 40 MG tablet Take 40 mg by mouth daily at 6 PM.   Yes [provider]  levothyroxine (SYNTHROID, LEVOTHROID) 75 MCG tablet TAKE 1 TABLET BY MOUTH ON AN EMPTY STOMACH W/A GLASS OF WATER AT LEAST 30-60 MIN BEFORE BREAKFAST. 03/28/18  Yes [provider]  losartan (COZAAR) 50 MG tablet Take 50 mg by mouth daily. 05/29/18  Yes [provider]  metFORMIN (GLUCOPHAGE) 1000 MG tablet Take 1,000 mg by mouth 2 (two) times daily with a meal. 05/16/18  Yes [provider]  metoprolol succinate (TOPROL-XL) 50 MG 24 hr tablet Take 50 mg by mouth daily. 05/16/18  Yes [provider]  Multiple Vitamin (MULTIVITAMIN WITH MINERALS) TABS tablet Take 1 tablet by mouth daily.   Yes [provider]    Allergies Patient has no known allergies.  History reviewed. No pertinent family history.  Social History Social History   Tobacco Use  . Smoking status: Former Research scientist (life sciences)  . Smokeless tobacco: Never Used  Substance Use Topics  . Alcohol use: Not on file  . Drug use: Not on file    Review of Systems Constitutional: No fever/chills Eyes: No visual changes. ENT: No sore throat. Cardiovascular: Chest pain as described above Respiratory: Shortness of breath associated with the chest pain as described above Gastrointestinal: No abdominal  pain.  No nausea, no vomiting.  No diarrhea.  No constipation. Genitourinary: Negative for dysuria. Musculoskeletal: Negative for neck pain.  Negative for back pain. Integumentary: Negative for rash. Neurological: Negative for headaches, focal weakness or numbness.   ____________________________________________   PHYSICAL EXAM:  VITAL SIGNS: ED Triage Vitals  Enc Vitals Group     BP 06/15/18 0125 (!) 221/81     Pulse Rate 06/15/18 0125 66     Resp 06/15/18 0125 20     Temp 06/15/18 0125 98 F (36.7 C)     Temp Source  06/15/18 0125 Oral     SpO2 06/15/18 0125 97 %     Weight 06/15/18 0120 98.4 kg (217 lb)     Height 06/15/18 0120 1.753 m (5\' 9" )     Head Circumference --      Peak Flow --      Pain Score 06/15/18 0119 5     Pain Loc --      Pain Edu? --      Excl. in [place]? --     Constitutional: Alert and oriented. Well appearing and in no acute distress. Eyes: Conjunctivae are normal.  Head: Atraumatic. Nose: No congestion/rhinnorhea. Mouth/Throat: Mucous membranes are moist. Neck: No stridor.  No meningeal signs.   Cardiovascular: Normal rate, regular rhythm. Good peripheral circulation. Grossly normal heart sounds. Respiratory: Normal respiratory effort.  No retractions. Lungs CTAB. Gastrointestinal: Soft and nontender. No distention.  Musculoskeletal: No lower extremity tenderness nor edema. No gross deformities of extremities. Neurologic:  Normal speech and language. No gross focal neurologic deficits are appreciated.  Skin:  Skin is warm, dry and intact. No rash noted. Psychiatric: Mood and affect are normal. Speech and behavior are normal.  ____________________________________________   LABS (all labs ordered are listed, but only abnormal results are displayed)  Labs Reviewed  BASIC METABOLIC PANEL - Abnormal; Notable for the following components:      Result Value   Glucose, Bld 168 (*)    All other components within normal limits  CBC - Abnormal; Notable for the following components:   HCT 39.2 (*)    All other components within normal limits  TROPONIN I  MAGNESIUM  PROTIME-INR  APTT   ____________________________________________  EKG  ED ECG REPORT I, Hinda Kehr, the attending physician, personally viewed and interpreted this ECG.  Date: 06/15/2018 EKG Time: 1:21 AM Rate: 66 Rhythm: normal sinus rhythm with occasional PVC  QRS Axis: normal Intervals: normal ST/T Wave abnormalities: Non-specific ST segment / T-wave changes, but no evidence of acute  ischemia. Narrative Interpretation: no evidence of acute ischemia   ____________________________________________  RADIOLOGY I, Hinda Kehr, personally viewed and evaluated these images (plain radiographs) as part of my medical decision making, as well as reviewing the written report by the radiologist.  ED MD interpretation: No acute intrathoracic abnormality  Official radiology report(s): Dg Chest 2 View  Result Date: 06/15/2018 CLINICAL DATA:  Chest pain EXAM: CHEST - 2 VIEW COMPARISON:  05/24/2011 FINDINGS: Mild bronchitic changes at the bases. No focal opacity or pleural effusion. Borderline to mild cardiomegaly. No pneumothorax. IMPRESSION: 1. Mild bronchitic changes at the bases 2. Borderline to mild cardiomegaly. Electronically Signed   By: Donavan Foil M.D.   On: 06/15/2018 01:37    ____________________________________________   PROCEDURES  Critical Care performed: Yes, see critical care procedure note(s)   Procedure(s) performed:   .Critical Care Performed by: Hinda Kehr, MD Authorized by: Hinda Kehr, MD   Critical care provider statement:  Critical care time (minutes):  30   Critical care time was exclusive of:  Separately billable procedures and treating other patients   Critical care was necessary to treat or prevent imminent or life-threatening deterioration of the following conditions: unstable angina.   Critical care was time spent personally by me on the following activities:  Development of treatment plan with patient or surrogate, discussions with consultants, evaluation of patient's response to treatment, examination of patient, obtaining history from patient or surrogate, ordering and performing treatments and interventions, ordering and review of laboratory studies, ordering and review of radiographic studies, pulse oximetry, re-evaluation of patient's condition and review of old  charts     ____________________________________________   INITIAL IMPRESSION / [place] / ED COURSE  As part of my medical decision making, I reviewed the following data within the [place] notes reviewed and incorporated, Labs reviewed , EKG interpreted , Old chart reviewed, Radiograph reviewed , Discussed with admitting physician  and Notes from prior ED visits    Differential diagnosis includes, but is not limited to, unstable angina versus NSTEMI, pneumonia, PE, musculoskeletal pain, GI related chest pain.  The patient has numerous risk factors and a significant cardiac history and is now having episodic chest pain that is getting worse over the last week.  This qualifies as unstable angina.  He has no evidence of acute ischemia on his EKG and a negative first troponin which is reassuring but he is at high risk and needs cardiac evaluation.  Additionally his blood pressure is uncontrolled which could either be a cause over the result of his ongoing chest pain.  I have placed 1 inch of nitroglycerin paste on his chest and have ordered heparin IV bolus and infusion as per the algorithm for unstable angina.  I explained this to him and his son and they understand and agree with the plan.  I discussed the case with Dr. Jannifer Franklin by phone and Dr. Marcille Blanco in person in the hospital service will admit.    ____________________________________________  FINAL CLINICAL IMPRESSION(S) / ED DIAGNOSES  Final diagnoses:  Unstable angina ([place])  Uncontrolled hypertension     MEDICATIONS GIVEN DURING THIS VISIT:  Medications  heparin ADULT infusion 100 units/mL (25000 units/272mL sodium chloride 0.45%) (has no administration in time range)  heparin injection 4,000 Units (4,000 Units Intravenous Given 06/15/18 0326)  nitroGLYCERIN (NITROGLYN) 2 % ointment 1 inch (1 inch Topical Given 06/15/18 0324)     ED Discharge Orders    None       Note:  This document  was prepared using Dragon voice recognition software and may include unintentional dictation errors.    Hinda Kehr, MD 06/15/18 [id number]

## 2018-06-15 NOTE — Progress Notes (Signed)
   06/15/18 1530  Clinical Encounter Type  Visited With Patient and family together  Visit Type Initial;Spiritual support  Referral From Nurse  Consult/Referral To Chaplain  Spiritual Encounters  Spiritual Needs Brochure;Emotional   Jason received an OR to educate the patient on the AD process. When I entered the room Jason Mclean was being visited by a friend who will become the patient's HCPO if he chooses to complete the form. I left the paperwork with the patient and will follow up as needed.

## 2018-06-16 ENCOUNTER — Encounter: Payer: Self-pay | Admitting: Cardiology

## 2018-06-16 ENCOUNTER — Encounter
Admission: EM | Disposition: A | Discharge status: Home / Self Care | Payer: Self-pay | Source: Home / Self Care | Attending: Internal Medicine

## 2018-06-16 HISTORY — PX: LEFT HEART CATH AND CORONARY ANGIOGRAPHY: CATH118249

## 2018-06-16 HISTORY — PX: CORONARY STENT INTERVENTION: CATH118234

## 2018-06-16 LAB — CBC
HCT: 37.4 % — ABNORMAL LOW (ref 40.0–52.0)
Hemoglobin: 12.6 g/dL — ABNORMAL LOW (ref 13.0–18.0)
MCH: 30.3 pg (ref 26.0–34.0)
MCHC: 33.6 g/dL (ref 32.0–36.0)
MCV: 90.1 fL (ref 80.0–100.0)
PLATELETS: 194 10*3/uL (ref 150–440)
RBC: 4.15 MIL/uL — AB (ref 4.40–5.90)
RDW: 14 % (ref 11.5–14.5)
WBC: 8.3 10*3/uL (ref 3.8–10.6)

## 2018-06-16 LAB — LIPID PANEL
CHOL/HDL RATIO: 4.6 ratio
Cholesterol: 202 mg/dL — ABNORMAL HIGH (ref 0–200)
HDL: 44 mg/dL (ref >40)
LDL Cholesterol: 124 mg/dL — ABNORMAL HIGH (ref 0–99)
Triglycerides: 172 mg/dL — ABNORMAL HIGH (ref <150)
VLDL: 34 mg/dL (ref 0–40)

## 2018-06-16 LAB — GLUCOSE, CAPILLARY
Glucose-Capillary: 292 mg/dL — ABNORMAL HIGH (ref 70–99)
Glucose-Capillary: 219 mg/dL — ABNORMAL HIGH (ref 70–99)

## 2018-06-16 LAB — POCT ACTIVATED CLOTTING TIME: Activated Clotting Time: 285 seconds

## 2018-06-16 LAB — HEPARIN LEVEL (UNFRACTIONATED): HEPARIN UNFRACTIONATED: 0.49 [IU]/mL (ref 0.30–0.70)

## 2018-06-16 SURGERY — LEFT HEART CATH AND CORONARY ANGIOGRAPHY
Anesthesia: Moderate Sedation

## 2018-06-16 MED ORDER — VERAPAMIL HCL 2.5 MG/ML IV SOLN
INTRAVENOUS | Status: DC | PRN
  Administered 2018-06-16: 2.5 mg via INTRA_ARTERIAL

## 2018-06-16 MED ORDER — ACETAMINOPHEN 325 MG PO TABS: 650.0000 mg | ORAL_TABLET | ORAL | Status: DC | PRN

## 2018-06-16 MED ORDER — NITROGLYCERIN 5 MG/ML IV SOLN
INTRAVENOUS | Status: AC
  Filled 2018-06-16: qty 10

## 2018-06-16 MED ORDER — HEPARIN SODIUM (PORCINE) 1000 UNIT/ML IJ SOLN
INTRAMUSCULAR | Status: DC | PRN
  Administered 2018-06-16: 6000 [IU] via INTRAVENOUS
  Administered 2018-06-16: 5000 [IU] via INTRAVENOUS
  Administered 2018-06-16: 3000 [IU] via INTRAVENOUS

## 2018-06-16 MED ORDER — ONDANSETRON HCL 4 MG/2ML IJ SOLN: 4.0000 mg | Freq: Four times a day (QID) | INTRAMUSCULAR | Status: DC | PRN

## 2018-06-16 MED ORDER — MORPHINE SULFATE (PF) 2 MG/ML IV SOLN
INTRAVENOUS | Status: AC
  Filled 2018-06-16: qty 1

## 2018-06-16 MED ORDER — NITROGLYCERIN 2 % TD OINT
1.0000 [in_us] | TOPICAL_OINTMENT | Freq: Once | TRANSDERMAL | Status: AC
  Administered 2018-06-16: 1 [in_us] via TOPICAL
  Filled 2018-06-16: qty 1

## 2018-06-16 MED ORDER — ASPIRIN 81 MG PO CHEW: 81.0000 mg | CHEWABLE_TABLET | Freq: Every day | ORAL | Status: DC

## 2018-06-16 MED ORDER — LABETALOL HCL 5 MG/ML IV SOLN: 10.0000 mg | INTRAVENOUS | Status: AC | PRN

## 2018-06-16 MED ORDER — ASPIRIN 81 MG PO CHEW
CHEWABLE_TABLET | ORAL | Status: DC | PRN
  Administered 2018-06-16: 243 mg via ORAL

## 2018-06-16 MED ORDER — HEPARIN SODIUM (PORCINE) 1000 UNIT/ML IJ SOLN
INTRAMUSCULAR | Status: AC
  Filled 2018-06-16: qty 1

## 2018-06-16 MED ORDER — CLOPIDOGREL BISULFATE 75 MG PO TABS
ORAL_TABLET | ORAL | Status: DC | PRN
  Administered 2018-06-16: 600 mg via ORAL

## 2018-06-16 MED ORDER — ATORVASTATIN CALCIUM 20 MG PO TABS
80.0000 mg | ORAL_TABLET | Freq: Every day | ORAL | Status: DC
  Administered 2018-06-16: 80 mg via ORAL
  Filled 2018-06-16: qty 4

## 2018-06-16 MED ORDER — CLOPIDOGREL BISULFATE 75 MG PO TABS
ORAL_TABLET | ORAL | Status: AC
  Filled 2018-06-16: qty 8

## 2018-06-16 MED ORDER — LABETALOL HCL 5 MG/ML IV SOLN: 10.0000 mg | INTRAVENOUS | Status: DC | PRN

## 2018-06-16 MED ORDER — MIDAZOLAM HCL 2 MG/2ML IJ SOLN
INTRAMUSCULAR | Status: DC | PRN
  Administered 2018-06-16: 1 mg via INTRAVENOUS

## 2018-06-16 MED ORDER — MIDAZOLAM HCL 2 MG/2ML IJ SOLN
INTRAMUSCULAR | Status: AC
  Filled 2018-06-16: qty 2

## 2018-06-16 MED ORDER — FENTANYL CITRATE (PF) 100 MCG/2ML IJ SOLN
INTRAMUSCULAR | Status: DC | PRN
  Administered 2018-06-16 (×3): 50 ug via INTRAVENOUS

## 2018-06-16 MED ORDER — SODIUM CHLORIDE 0.9% FLUSH: 3.0000 mL | INTRAVENOUS | Status: DC | PRN

## 2018-06-16 MED ORDER — SODIUM CHLORIDE 0.9 % IV SOLN: 250.0000 mL | INTRAVENOUS | Status: DC | PRN

## 2018-06-16 MED ORDER — SODIUM CHLORIDE 0.9% FLUSH
3.0000 mL | Freq: Two times a day (BID) | INTRAVENOUS | Status: DC
  Administered 2018-06-16: 3 mL via INTRAVENOUS

## 2018-06-16 MED ORDER — NITROGLYCERIN 1 MG/10 ML FOR IR/CATH LAB
INTRA_ARTERIAL | Status: DC | PRN
  Administered 2018-06-16: 300 ug via INTRACORONARY
  Administered 2018-06-16: 300 ug
  Administered 2018-06-16: 300 ug via INTRACORONARY
  Administered 2018-06-16: 300 ug

## 2018-06-16 MED ORDER — HYDRALAZINE HCL 20 MG/ML IJ SOLN: 5.0000 mg | INTRAMUSCULAR | Status: DC | PRN

## 2018-06-16 MED ORDER — GI COCKTAIL ~~LOC~~
30.0000 mL | Freq: Once | ORAL | Status: AC
  Administered 2018-06-16: 30 mL via ORAL
  Filled 2018-06-16: qty 30

## 2018-06-16 MED ORDER — ASPIRIN 81 MG PO CHEW
81.0000 mg | CHEWABLE_TABLET | Freq: Every day | ORAL | Status: DC
  Administered 2018-06-16: 81 mg via ORAL
  Filled 2018-06-16: qty 1

## 2018-06-16 MED ORDER — HYDRALAZINE HCL 20 MG/ML IJ SOLN: 5.0000 mg | INTRAMUSCULAR | Status: AC | PRN

## 2018-06-16 MED ORDER — HEPARIN (PORCINE) IN NACL 1000-0.9 UT/500ML-% IV SOLN
INTRAVENOUS | Status: AC
  Filled 2018-06-16: qty 1000

## 2018-06-16 MED ORDER — CLOPIDOGREL BISULFATE 75 MG PO TABS: 75.0000 mg | ORAL_TABLET | Freq: Every day | ORAL | Status: DC

## 2018-06-16 MED ORDER — SODIUM CHLORIDE 0.9% FLUSH: 3.0000 mL | Freq: Two times a day (BID) | INTRAVENOUS | Status: DC

## 2018-06-16 MED ORDER — LIDOCAINE HCL (PF) 1 % IJ SOLN
INTRAMUSCULAR | Status: AC
  Filled 2018-06-16: qty 30

## 2018-06-16 MED ORDER — SODIUM CHLORIDE 0.9 % IV SOLN
250.0000 mL | INTRAVENOUS | Status: DC | PRN
Start: 2018-06-16 — End: 2018-06-17

## 2018-06-16 MED ORDER — SODIUM CHLORIDE 0.9 % WEIGHT BASED INFUSION: 1.0000 mL/kg/h | INTRAVENOUS | Status: AC

## 2018-06-16 MED ORDER — ASPIRIN 81 MG PO CHEW
CHEWABLE_TABLET | ORAL | Status: AC
  Filled 2018-06-16: qty 3

## 2018-06-16 MED ORDER — FENTANYL CITRATE (PF) 100 MCG/2ML IJ SOLN
INTRAMUSCULAR | Status: AC
  Filled 2018-06-16: qty 2

## 2018-06-16 MED ORDER — VERAPAMIL HCL 2.5 MG/ML IV SOLN
INTRAVENOUS | Status: AC
  Filled 2018-06-16: qty 2

## 2018-06-16 MED ORDER — IOPAMIDOL (ISOVUE-300) INJECTION 61%
INTRAVENOUS | Status: DC | PRN
  Administered 2018-06-16: 240 mL via INTRA_ARTERIAL

## 2018-06-16 MED ORDER — CLOPIDOGREL BISULFATE 75 MG PO TABS
75.0000 mg | ORAL_TABLET | Freq: Every day | ORAL | Status: DC
  Administered 2018-06-17: 75 mg via ORAL
  Filled 2018-06-16: qty 1

## 2018-06-16 MED ORDER — SODIUM CHLORIDE 0.9% FLUSH
3.0000 mL | INTRAVENOUS | Status: DC | PRN
Start: 2018-06-16 — End: 2018-06-17

## 2018-06-16 SURGICAL SUPPLY — 18 items
BALLN TREK RX 2.5X20 (BALLOONS) ×2
BALLN ~~LOC~~ TREK RX 3.0X20 (BALLOONS) ×2
BALLOON TREK RX 2.5X20 (BALLOONS) ×1 IMPLANT
BALLOON ~~LOC~~ TREK RX 3.0X20 (BALLOONS) ×1 IMPLANT
CATH INFINITI 5FR TG (CATHETERS) ×2 IMPLANT
CATH VISTA GUIDE 6FR JR4 (CATHETERS) ×2 IMPLANT
DEVICE INFLAT 30 PLUS (MISCELLANEOUS) ×2 IMPLANT
DEVICE RAD TR BAND REGULAR (VASCULAR PRODUCTS) ×2 IMPLANT
KIT MANI 3VAL PERCEP (MISCELLANEOUS) ×2 IMPLANT
NEEDLE PERC 18GX7CM (NEEDLE) IMPLANT
NEEDLE PERC 21GX4CM (NEEDLE) ×2 IMPLANT
PACK CARDIAC CATH (CUSTOM PROCEDURE TRAY) ×2 IMPLANT
SHEATH AVANTI 5FR X 11CM (SHEATH) IMPLANT
SHEATH RAIN RADIAL 21G 6FR (SHEATH) ×2 IMPLANT
STENT RESOLUTE ONYX 3.0X18 (Permanent Stent) ×2 IMPLANT
STENT SIERRA 2.75 X 38 MM (Permanent Stent) ×2 IMPLANT
WIRE ASAHI PROWATER 180CM (WIRE) ×2 IMPLANT
WIRE ROSEN-J .035X260CM (WIRE) ×2 IMPLANT

## 2018-06-16 NOTE — Progress Notes (Signed)
Patient off unit for cardiac catheterization. Will resume care upon return. Jason Mclean Jason Mclean  

## 2018-06-16 NOTE — Progress Notes (Signed)
Cardiovascular and Pulmonary Nurse Navigator Note:    72 year old male with history of CAD - S/P PCI x 3 with stent in the ramus intermedius done in February 1999, stent in the mid and distal RCA done in 2005, hx of preserved LV function with a negative functional study in 2017 with an EF of 66%.  Last heart cath done on 2005.  Initial troponin was < 0.03, subsequent troponins were 0.17, 0.30, and 0.29.  Patient with hx of CAD, DM, HLD, Hypothyroidism, and HTN.  Patient admitted with dx of NSTEMI.    Patient underwent cardiac cath today.     Panel Physicians Referring Physician Case Authorizing Physician  Isaias Cowman, MD (Primary)  Teodoro Spray, MD  Procedures   CORONARY STENT INTERVENTION  LEFT HEART CATH AND CORONARY ANGIOGRAPHY  Conclusion     Previously placed Ost Ramus to Ramus stent (unknown type) is widely patent.  Ost 1st Mrg to 1st Mrg lesion is 90% stenosed.  Prox LAD lesion is 20% stenosed.  Mid RCA to Dist RCA lesion is 95% stenosed.  Mid RCA lesion is 75% stenosed.  2nd RPLB lesion is 90% stenosed.  A drug-eluting stent was successfully placed using a Iota [id number].  Post intervention, there is a 0% residual stenosis.  Post intervention, there is a 0% residual stenosis.    1. High grade in-stent restenosis mid and distal RCA 2. Normal LVEF 3. Overlapping DES ( 3.0 x 3mm  and 2.75 x 38 mm Resolute Onyx ) mid and distal RCA   EDUCATION:   Patient appeared to be dozing off and on.  Significant other at bedside.    "Heart Attack Bouncing Back" booklet given and reviewed with patient. Discussed the definition of CAD. Reviewed the location of CAD and where his stent was placed. Informed patient he will be given a stent card. Explained the purpose of the stent card. Instructed patient to keep stent card in his wallet.  ? Discussed modifiable risk factors including controlling blood pressure, cholesterol, and blood sugar; following heart  healthy diet; maintaining healthy weight; exercise; and smoking cessation, if applicable.  ? Discussed cardiac medications including rationale for taking, mechanisms of action, and side effects. Stressed the importance of taking medications as prescribed. Explained to patient his lipitor dose has been increased from 40 m to 80 mg.   ? Discussed emergency plan for heart attack symptoms. Patient verbalized understanding of need to call 911 and not to drive himself to ER if having cardiac symptoms / chest pain.  ? Heart healthy diet of low sodium, low fat, low cholesterol carb modified diet discussed. Information on diet provided.  ? Smoking Cessation - Patient is a FORMER smoker.   ? Exercise - Benefits of exercised discussed.  Patient reports that he is very active mowing the yard - both push mowing and riding.  Recommendations and rationale for ongoing exercise discussed. Explained to patient he has been referred by his cardiologist to Cardiac Rehab.  An overview of the program was provided.  Informational letter, class and orientation times, and CPT billing codes given to patient.  Patient plans to check with his insurance company to see what his out-of-pocket expenses will be.  ? Patient appreciative of the information.   Note:  Teach Back method used.   ? Roanna Epley, RN, BSN, [facility]  Cardiovascular & Pulmonary Nurse Navigator  Direct Line: [phone]  Department Phone #: [phone] Fax: [phone]  Email Address: [email or web address]

## 2018-06-16 NOTE — Progress Notes (Signed)
ANTICOAGULATION CONSULT NOTE - Initial Consult  Pharmacy Consult for heparin drip Indication: chest pain/ACS  No Known Allergies  Patient Measurements: Height: 5\' 9"  (175.3 cm) Weight: 224 lb 1.6 oz (101.7 kg) IBW/kg (Calculated) : 70.7 Heparin Dosing Weight: 91 kg  Vital Signs: Temp: 98.3 F (36.8 C) (07/26 0513) Temp Source: Oral (07/26 0513) BP: 140/73 (07/26 0513) Pulse Rate: 70 (07/26 0513)  Labs: Recent Labs    06/15/18 0144 06/15/18 0802 06/15/18 1133 06/15/18 1328 06/15/18 1950 06/16/18 0559  HGB 13.3  --   --   --   --   --   HCT 39.2*  --   --   --   --   --   PLT 207  --   --   --   --   --   APTT 31  --   --   --   --   --   LABPROT 12.2  --   --   --   --   --   INR 0.91  --   --   --   --   --   HEPARINUNFRC  --   --  0.35  --  0.29* 0.49  CREATININE 0.90  --   --   --   --   --   TROPONINI <0.03 0.17*  --  0.30* 0.29*  --     Estimated Creatinine Clearance: 89.8 mL/min (by C-G formula based on SCr of 0.9 mg/dL).   Medical History: Past Medical History:  Diagnosis Date  . Coronary artery disease    s/p three coronary stents  . Diabetes mellitus without complication ([place])   . Hypercholesterolemia   . Hypertension     Medications:  No anticoag in PTA meds  Assessment: Initial Trop < 0.03  Goal of Therapy:  Heparin level 0.3-0.7 units/ml Monitor platelets by anticoagulation protocol: Yes   Plan:  7/25 @ 1950 Heparin level subtherapeutic at 0.29. Will increase heparin drip to 1400 units/hr and recheck HL in 8 hours.  7/26 AM heparin level 0.49. Recheck in 6 hours to confirm. CBC with next heparin level.  Eloise Harman, PharmD, BCPS Clinical Pharmacist 06/16/2018 6:54 AM

## 2018-06-16 NOTE — Plan of Care (Signed)
  Problem: Clinical Measurements: Goal: Ability to maintain clinical measurements within normal limits will improve Outcome: Not Progressing Note:  Hemoglobin today = only 12.6. Will continue to monitor lab values. Jason Mclean [facility]

## 2018-06-16 NOTE — Care Management (Signed)
At present  does not appear is being discharged home on cost prohibitive antiplatelet medication

## 2018-06-16 NOTE — Progress Notes (Signed)
[place] [facility] [place] NAME: Jason Mclean    MR#:  [id number]  DATE OF BIRTH:  12/09/1945  SUBJECTIVE:  CHIEF COMPLAINT:   Chief Complaint  Patient presents with  . Chest Pain   The patient has no complaints.  Status post cardiac cath with PCI today. REVIEW OF SYSTEMS:  Review of Systems  Constitutional: Negative for chills, fever and malaise/fatigue.  HENT: Negative for sore throat.   Eyes: Negative for blurred vision and double vision.  Respiratory: Negative for cough, hemoptysis, shortness of breath, wheezing and stridor.   Cardiovascular: Negative for chest pain, palpitations, orthopnea and leg swelling.  Gastrointestinal: Negative for abdominal pain, blood in stool, diarrhea, melena, nausea and vomiting.  Genitourinary: Negative for dysuria, flank pain and hematuria.  Musculoskeletal: Negative for back pain and joint pain.  Skin: Negative for rash.  Neurological: Negative for dizziness, sensory change, focal weakness, seizures, loss of consciousness, weakness and headaches.  Endo/Heme/Allergies: Negative for polydipsia.  Psychiatric/Behavioral: Negative for depression. The patient is not nervous/anxious.     DRUG ALLERGIES:  No Known Allergies VITALS:  Blood pressure (!) 143/80, pulse 68, temperature 98.2 F (36.8 C), temperature source Oral, resp. rate 18, height 5\' 9"  (1.753 m), weight 224 lb (101.6 kg), SpO2 98 %. PHYSICAL EXAMINATION:  Physical Exam  Constitutional: He is oriented to person, place, and time. He appears well-developed.  HENT:  Head: Normocephalic.  Mouth/Throat: Oropharynx is clear and moist.  Eyes: Pupils are equal, round, and reactive to light. Conjunctivae and EOM are normal. No scleral icterus.  Neck: Normal range of motion. Neck supple. No JVD present. No tracheal deviation present.  Cardiovascular: Normal rate, regular rhythm and normal heart sounds. Exam reveals no gallop.  No murmur  heard. Pulmonary/Chest: Effort normal and breath sounds normal. No respiratory distress. He has no wheezes. He has no rales.  Abdominal: Soft. Bowel sounds are normal. He exhibits no distension. There is no tenderness. There is no rebound.  Musculoskeletal: Normal range of motion. He exhibits no edema or tenderness.  Neurological: He is alert and oriented to person, place, and time. No cranial nerve deficit.  Skin: No rash noted. No erythema.  Psychiatric: He has a normal mood and affect.  Vitals reviewed.  LABORATORY PANEL:  Male CBC Recent Labs  Lab 06/16/18 1450  WBC 8.3  HGB 12.6*  HCT 37.4*  PLT 194   ------------------------------------------------------------------------------------------------------------------ Chemistries  Recent Labs  Lab 06/15/18 0144  NA 138  K 4.2  CL 103  CO2 27  GLUCOSE 168*  BUN 21  CREATININE 0.90  CALCIUM 9.3  MG 1.9   RADIOLOGY:  No results found. ASSESSMENT AND PLAN:   This is a 72 year old male admitted for NSTEMI. 1.  NSTEMI: He was treated with heparin drip.  He got a cardiac cath and PCI today. Continue aspirin and the Plavix.  Follow-up renal function tomorrow morning.  2.  CAD: Status post 3 stents in the past.  Last catheterization 2005.  Continue aspirin and Plavix.  Nitroglycerin as needed. 3.  Hypertension: Controlled; continue losartan and Lopressor. 4.  Diabetes mellitus type 2: Hold metformin; sliding scale insulin hospitalized 5.  Hyperlipidemia: Continue statin therapy. LDL 124. 6.  Hypothyroidism: Continue Synthroid.  All the records are reviewed and case discussed with Care Management/Social Worker. Management plans discussed with the patient, family and they are in agreement.  CODE STATUS: Full Code  TOTAL TIME TAKING CARE OF THIS PATIENT: 26 minutes.  More than 50% of the time was spent in counseling/coordination of care: YES  POSSIBLE D/C IN 1 DAYS, DEPENDING ON CLINICAL CONDITION.   Demetrios Loll M.D  on 06/16/2018 at 4:40 PM  Between 7am to 6pm - Pager - [phone]  After 6pm go to [email or web address] - [facility]

## 2018-06-17 LAB — CBC
HEMATOCRIT: 39.1 % — AB (ref 40.0–52.0)
Hemoglobin: 13.2 g/dL (ref 13.0–18.0)
MCH: 30.1 pg (ref 26.0–34.0)
MCHC: 33.7 g/dL (ref 32.0–36.0)
MCV: 89.2 fL (ref 80.0–100.0)
PLATELETS: 211 10*3/uL (ref 150–440)
RBC: 4.39 MIL/uL — ABNORMAL LOW (ref 4.40–5.90)
RDW: 13.9 % (ref 11.5–14.5)
WBC: 8.2 10*3/uL (ref 3.8–10.6)

## 2018-06-17 LAB — BASIC METABOLIC PANEL
Anion gap: 8 (ref 5–15)
BUN: 14 mg/dL (ref 8–23)
CO2: 27 mmol/L (ref 22–32)
CREATININE: 0.87 mg/dL (ref 0.61–1.24)
Calcium: 8.6 mg/dL — ABNORMAL LOW (ref 8.9–10.3)
Chloride: 102 mmol/L (ref 98–111)
GFR calc Af Amer: >60 mL/min (ref >60)
GLUCOSE: 210 mg/dL — AB (ref 70–99)
Potassium: 3.9 mmol/L (ref 3.5–5.1)
SODIUM: 137 mmol/L (ref 135–145)

## 2018-06-17 LAB — GLUCOSE, CAPILLARY
Glucose-Capillary: 206 mg/dL — ABNORMAL HIGH (ref 70–99)
Glucose-Capillary: 260 mg/dL — ABNORMAL HIGH (ref 70–99)

## 2018-06-17 MED ORDER — ISOSORBIDE MONONITRATE ER 30 MG PO TB24: 30.0000 mg | ORAL_TABLET | Freq: Every day | ORAL | 0 refills | Status: DC

## 2018-06-17 MED ORDER — ISOSORBIDE MONONITRATE ER 30 MG PO TB24: 30.0000 mg | ORAL_TABLET | Freq: Every day | ORAL | Status: DC

## 2018-06-17 MED ORDER — CLOPIDOGREL BISULFATE 75 MG PO TABS: 75.0000 mg | ORAL_TABLET | Freq: Every day | ORAL | 0 refills | Status: DC

## 2018-06-17 MED ORDER — COLCHICINE 0.6 MG PO TABS: 0.6000 mg | ORAL_TABLET | Freq: Two times a day (BID) | ORAL | Status: DC

## 2018-06-17 MED ORDER — ATORVASTATIN CALCIUM 80 MG PO TABS: 80.0000 mg | ORAL_TABLET | Freq: Every day | ORAL | 0 refills | Status: DC

## 2018-06-17 MED ORDER — COLCHICINE 0.6 MG PO TABS: 0.6000 mg | ORAL_TABLET | Freq: Two times a day (BID) | ORAL | 0 refills | Status: DC

## 2018-06-17 NOTE — Discharge Summary (Signed)
[place] at [facility] 72 y.o., DOB Sep 29, 1946, MRN [id number]. Admission date: 06/15/2018 Discharge Date 06/17/2018 Primary MD Tracie Harrier, MD Admitting Physician Harrie Foreman, MD  Admission Diagnosis  Unstable angina [facility]) [I20.0] Uncontrolled hypertension [I10]  Discharge Diagnosis   Active Problems:   NSTEMI (non-ST elevated myocardial infarction) [facility])   CAD     HTN   DM2   hyperlipdemia  hypothyrodism       Hospital Course   Patient is 72 year old with history of coronary artery disease presented with complaint of chest pain.  He was noted to have a non-ST MI.  Patient underwent cardiac cath and had a PCI to the RCA.  Patient tolerated the procedure well.  Patient was having some chest pain and was seen by cardiology given some colchicine now his chest pain is resolved.  Patient stable for discharge.  He will need outpatient follow-up with his primary cardiology.    MI / NSTEMI Discharge Checklist  Aspirin prescribed at discharge:   Yes   High Intensity Statin Prescribed? (Lipitor 40-80mg  or Crestor 20-40mg ):Yes   Beta Blocker Prescribed: Yes   ADP Receptor Inhibitor Prescribed? (i.e. Plavix etc.- Includes Medically Managed Patients): Yes   Was EF assessed during THIS hospitalization? Yes  (YES = Measured in current episode of care or document plan to evaluate after discharge.)  Was EF < 40%?   no For EF < 40%, was ACE/ARB prescribed?  For EF < 40%, was Aldosterone Inhibitor prescribed?   (If contraindicated, provide explanation.)    Was Cardiac Rehab Phase II ordered prior to discharge? (Includes Medically Managed Patients): Yes        Consults  cardiology  Significant Tests:  See full reports for all details     Dg Chest 2 View  Result Date: 06/15/2018 CLINICAL DATA:  Chest pain EXAM: CHEST - 2 VIEW COMPARISON:  05/24/2011 FINDINGS: Mild bronchitic changes at the bases. No focal opacity or  pleural effusion. Borderline to mild cardiomegaly. No pneumothorax. IMPRESSION: 1. Mild bronchitic changes at the bases 2. Borderline to mild cardiomegaly. Electronically Signed   By: Donavan Foil M.D.   On: 06/15/2018 01:37       Today   Subjective:   Jason Mclean chest pain now resolved o Objective:   Blood pressure 134/75, pulse 98, temperature 98 F (36.7 C), temperature source Oral, resp. rate 18, height 5\' 9"  (1.753 m), weight 101.1 kg (222 lb 14.4 oz), SpO2 96 %.  .  Intake/Output Summary (Last 24 hours) at 06/17/2018 1446 Last data filed at 06/17/2018 1033 Gross per 24 hour  Intake 480 ml  Output 1200 ml  Net -720 ml    Exam VITAL SIGNS: Blood pressure 134/75, pulse 98, temperature 98 F (36.7 C), temperature source Oral, resp. rate 18, height 5\' 9"  (1.753 m), weight 101.1 kg (222 lb 14.4 oz), SpO2 96 %.  GENERAL:  72 y.o.-year-old patient lying in the bed with no acute distress.  EYES: Pupils equal, round, reactive to light and accommodation. No scleral icterus. Extraocular muscles intact.  HEENT: Head atraumatic, normocephalic. Oropharynx and nasopharynx clear.  NECK:  Supple, no jugular venous distention. No thyroid enlargement, no tenderness.  LUNGS: Normal breath sounds bilaterally, no wheezing, rales,rhonchi or crepitation. No use of accessory muscles of respiration.  CARDIOVASCULAR: S1, S2 normal. No murmurs, rubs, or gallops.  ABDOMEN: Soft, nontender, nondistended. Bowel sounds present. No organomegaly or mass.  EXTREMITIES: No pedal edema, cyanosis, or clubbing.  NEUROLOGIC: Cranial nerves II through XII are intact. Muscle strength 5/5 in all extremities. Sensation intact. Gait not checked.  PSYCHIATRIC: The patient is alert and oriented x 3.  SKIN: No obvious rash, lesion, or ulcer.   Data Review     CBC w Diff:  Lab Results  Component Value Date   WBC 8.2 06/17/2018   HGB 13.2 06/17/2018   HGB 13.5 11/20/2014   HCT 39.1 (L) 06/17/2018   HCT 41.4  11/20/2014   PLT 211 06/17/2018   PLT 203 11/20/2014   LYMPHOPCT 33.0 11/20/2014   MONOPCT 10.4 11/20/2014   EOSPCT 3.7 11/20/2014   BASOPCT 0.7 11/20/2014   CMP:  Lab Results  Component Value Date   NA 137 06/17/2018   NA 140 11/20/2014   K 3.9 06/17/2018   K 4.0 11/20/2014   CL 102 06/17/2018   CL 101 11/20/2014   CO2 27 06/17/2018   CO2 30 11/20/2014   BUN 14 06/17/2018   BUN 11 11/20/2014   CREATININE 0.87 06/17/2018   CREATININE 0.90 11/20/2014   PROT 7.4 11/20/2014   ALBUMIN 3.6 11/20/2014   BILITOT 0.5 11/20/2014   ALKPHOS 78 11/20/2014   AST 25 11/20/2014   ALT 36 11/20/2014  .  Micro Results No results found for this or any previous visit (from the past 240 hour(s)).      Code Status Orders  (From admission, onward)        Start     Ordered   06/15/18 0612  Full code  Continuous     06/15/18 0611    Code Status History    This patient has a current code status but no historical code status.          Follow-up Information    Tracie Harrier, MD Follow up in 6 day(s).   Specialty:  Internal Medicine Contact information: [address] [place] [address] [phone]        Isaias Cowman, MD Follow up in 1 week(s).   Specialty:  Cardiology Why:  post mi Contact information: [facility] [place] [address] [phone]           Discharge Medications   Allergies as of 06/17/2018   No Known Allergies     Medication List    TAKE these medications   aspirin EC 81 MG tablet Take 81 mg by mouth daily as needed.   atorvastatin 80 MG tablet Commonly known as:  LIPITOR Take 1 tablet (80 mg total) by mouth daily at 6 PM. What changed:    medication strength  how much to take   clopidogrel 75 MG tablet Commonly known as:  PLAVIX Take 1 tablet (75 mg total) by mouth daily with breakfast. Start taking on:  06/18/2018   colchicine 0.6 MG  tablet Take 1 tablet (0.6 mg total) by mouth 2 (two) times daily for 7 days.   isosorbide mononitrate 30 MG 24 hr tablet Commonly known as:  IMDUR Take 1 tablet (30 mg total) by mouth daily.   levothyroxine 75 MCG tablet Commonly known as:  SYNTHROID, LEVOTHROID TAKE 1 TABLET BY MOUTH ON AN EMPTY STOMACH W/A GLASS OF WATER AT LEAST 30-60 MIN BEFORE BREAKFAST.   losartan 50 MG tablet Commonly known as:  COZAAR Take 50 mg by mouth daily.   metFORMIN 1000 MG tablet Commonly known as:  GLUCOPHAGE Take 1,000 mg by mouth 2 (two) times daily with a meal.  metoprolol succinate 50 MG 24 hr tablet Commonly known as:  TOPROL-XL Take 50 mg by mouth daily.   multivitamin with minerals Tabs tablet Take 1 tablet by mouth daily.          Total Time in preparing paper work, data evaluation and todays exam - 28 minutes  Dustin Flock M.D on 06/17/2018 at 2:46 PM [place]  [phone]

## 2018-06-17 NOTE — Progress Notes (Signed)
Patient reported to the MD he felt much better.  Patient denied chest pain to the RN.  Patient reported feeling better after the medicine for his BP.  Patient ambulated around the nurse's station and then returned to his room.    His IVs were removed.  He left in a private vehicle with his wife.   Phillis Knack, RN

## 2018-06-17 NOTE — Progress Notes (Signed)
Patient Name: Jason Mclean. Date of Encounter: 06/17/2018  Hospital Problem List     Active Problems:   NSTEMI (non-ST elevated myocardial infarction) [facility])    Patient Profile     Patient with history of coronary disease status post PCI of the ramus intermedius and RCA in the past.  Subjective   Constant chest pain  Inpatient Medications    . aspirin  81 mg Oral Daily  . atorvastatin  80 mg Oral q1800  . clopidogrel  75 mg Oral Q breakfast  . docusate sodium  100 mg Oral BID  . insulin aspart  0-5 Units Subcutaneous QHS  . insulin aspart  0-9 Units Subcutaneous TID WC  . levothyroxine  75 mcg Oral QAC breakfast  . losartan  50 mg Oral Daily  . metoprolol succinate  50 mg Oral Daily  . sodium chloride flush  3 mL Intravenous Q12H    Vital Signs    Vitals:   06/17/18 0319 06/17/18 0413 06/17/18 0754 06/17/18 0758  BP: (!) 202/88 (!) 186/75 (!) 189/81 (!) 178/76  Pulse: 76  84 85  Resp: 18  18   Temp: 97.9 F (36.6 C)  98 F (36.7 C)   TempSrc: Oral  Oral   SpO2: 97%  96%   Weight: 101.1 kg (222 lb 14.4 oz)     Height:        Intake/Output Summary (Last 24 hours) at 06/17/2018 0854 Last data filed at 06/17/2018 0500 Gross per 24 hour  Intake 480 ml  Output 1200 ml  Net -720 ml   Filed Weights   06/16/18 0513 06/16/18 0656 06/17/18 0319  Weight: 101.7 kg (224 lb 1.6 oz) 101.6 kg (224 lb) 101.1 kg (222 lb 14.4 oz)    Physical Exam    GEN: Well nourished, well developed, in no acute distress.  HEENT: normal.  Neck: Supple, no JVD, carotid bruits, or masses. Cardiac: RRR, no murmurs, rubs, or gallops. No clubbing, cyanosis, edema.  Radials/DP/PT 2+ and equal bilaterally.  Respiratory:  Respirations regular and unlabored, clear to auscultation bilaterally. GI: Soft, nontender, nondistended, BS + x 4. MS: no deformity or atrophy. Skin: warm and dry, no rash. Neuro:  Strength and sensation are intact. Psych: Normal affect.  Labs    CBC Recent Labs     06/16/18 1450 06/17/18 0702  WBC 8.3 8.2  HGB 12.6* 13.2  HCT 37.4* 39.1*  MCV 90.1 89.2  PLT 194 119   Basic Metabolic Panel Recent Labs    06/15/18 0144 06/17/18 0702  NA 138 137  K 4.2 3.9  CL 103 102  CO2 27 27  GLUCOSE 168* 210*  BUN 21 14  CREATININE 0.90 0.87  CALCIUM 9.3 8.6*  MG 1.9  --    Liver Function Tests No results for input(s): AST, ALT, ALKPHOS, BILITOT, PROT, ALBUMIN in the last 72 hours. No results for input(s): LIPASE, AMYLASE in the last 72 hours. Cardiac Enzymes Recent Labs    06/15/18 0802 06/15/18 1328 06/15/18 1950  TROPONINI 0.17* 0.30* 0.29*   BNP No results for input(s): BNP in the last 72 hours. D-Dimer No results for input(s): DDIMER in the last 72 hours. Hemoglobin A1C Recent Labs    06/15/18 0144  HGBA1C 8.8*   Fasting Lipid Panel Recent Labs    06/16/18 0559  CHOL 202*  HDL 44  LDLCALC 124*  TRIG 172*  CHOLHDL 4.6   Thyroid Function Tests Recent Labs    06/15/18 0144  TSH 6.431*    Telemetry    Sinus rhythm  ECG  Normal sinus rhythm  Radiology    Dg Chest 2 View  Result Date: 06/15/2018 CLINICAL DATA:  Chest pain EXAM: CHEST - 2 VIEW COMPARISON:  05/24/2011 FINDINGS: Mild bronchitic changes at the bases. No focal opacity or pleural effusion. Borderline to mild cardiomegaly. No pneumothorax. IMPRESSION: 1. Mild bronchitic changes at the bases 2. Borderline to mild cardiomegaly. Electronically Signed   By: Donavan Foil M.D.   On: 06/15/2018 01:37    Assessment & Plan    Coronary artery disease-status post PCI of the RCA secondary to restenosis of RCA.  Patient continued to have chest pain post PCI and continues to have midsternal chest heaviness which is fairly constant.  He is hemodynamically stable.  May be inflammation post PCI although this was present before PCI as well.  Will try colchicine to see if this will help.  Signed, Javier Docker Shadawn Hanaway MD 06/17/2018, 8:54 AM  Pager: [phone]

## 2018-06-17 NOTE — Progress Notes (Signed)
Patient BP has been running high. PRN medication given and BP was rechecked, will continue to monitor.

## 2018-06-17 NOTE — Progress Notes (Signed)
MD notified: Patient reported having "chest pain for days".  He refused morphine because he said it makes him feel weird "funny".  His BP was above the 180, PRN medicine given see MAR.  Phillis Knack, RN

## 2018-06-20 ENCOUNTER — Other Ambulatory Visit: Payer: Self-pay

## 2018-06-20 NOTE — Patient Outreach (Signed)
[facility]) Care Management  06/20/2018  Jason Mclean. Mar 28, 1946 [id number]     EMMI-GENERAL DISCHARGE RED ON EMMI ALERT Day # 1 Date: 06/19/18 Red Alert Reason: " Scheduled follow-up?"     Outreach attempt #1 to patient. Spoke with patient who reports he is doing well. He denies any new issues or concerns. Reviewed and addressed red alert with patient. He voices that he had already planned to call MD offices today to get appts scheduled. Patient has discharge paperwork and knows how to contact MDs to make appts. He voices that he is independent and able to drive himself to appts. RN CM confirmed that patient has all his meds and he voices no issues or concerns regarding them. He denies any RN CM needs or concerns at this time. Advised patient that they would get one more automated EMMI-GENERAL post discharge calls to assess how they are doing following recent hospitalization and will receive a call from a nurse if any of their responses were abnormal. Patient voiced understanding and was appreciative of f/u call.    Plan: RN CM will close case as no further interventions needed at this time.    Enzo Montgomery, RN,BSN,CCM [place] [facility] Telephonic Care Management Coordinator Direct Phone: [phone] Toll Free: [phone] Fax: [phone]

## 2018-06-22 DIAGNOSIS — I2510 Atherosclerotic heart disease of native coronary artery without angina pectoris: Secondary | ICD-10-CM | POA: Diagnosis not present

## 2018-06-22 DIAGNOSIS — I10 Essential (primary) hypertension: Secondary | ICD-10-CM | POA: Diagnosis not present

## 2018-06-22 DIAGNOSIS — E119 Type 2 diabetes mellitus without complications: Secondary | ICD-10-CM | POA: Diagnosis not present

## 2018-06-23 DIAGNOSIS — R079 Chest pain, unspecified: Secondary | ICD-10-CM | POA: Diagnosis not present

## 2018-06-23 DIAGNOSIS — J410 Simple chronic bronchitis: Secondary | ICD-10-CM | POA: Diagnosis not present

## 2018-06-23 DIAGNOSIS — E119 Type 2 diabetes mellitus without complications: Secondary | ICD-10-CM | POA: Diagnosis not present

## 2018-06-23 DIAGNOSIS — I214 Non-ST elevation (NSTEMI) myocardial infarction: Secondary | ICD-10-CM | POA: Diagnosis not present

## 2018-06-23 DIAGNOSIS — I2510 Atherosclerotic heart disease of native coronary artery without angina pectoris: Secondary | ICD-10-CM | POA: Diagnosis not present

## 2018-06-23 DIAGNOSIS — I493 Ventricular premature depolarization: Secondary | ICD-10-CM | POA: Diagnosis not present

## 2018-06-23 DIAGNOSIS — I10 Essential (primary) hypertension: Secondary | ICD-10-CM | POA: Diagnosis not present

## 2018-06-23 DIAGNOSIS — R0602 Shortness of breath: Secondary | ICD-10-CM | POA: Diagnosis not present

## 2018-06-23 DIAGNOSIS — Z9889 Other specified postprocedural states: Secondary | ICD-10-CM | POA: Diagnosis not present

## 2018-06-28 DIAGNOSIS — D2271 Melanocytic nevi of right lower limb, including hip: Secondary | ICD-10-CM | POA: Diagnosis not present

## 2018-06-28 DIAGNOSIS — D2272 Melanocytic nevi of left lower limb, including hip: Secondary | ICD-10-CM | POA: Diagnosis not present

## 2018-06-28 DIAGNOSIS — D2261 Melanocytic nevi of right upper limb, including shoulder: Secondary | ICD-10-CM | POA: Diagnosis not present

## 2018-06-28 DIAGNOSIS — L820 Inflamed seborrheic keratosis: Secondary | ICD-10-CM | POA: Diagnosis not present

## 2018-06-28 DIAGNOSIS — Z08 Encounter for follow-up examination after completed treatment for malignant neoplasm: Secondary | ICD-10-CM | POA: Diagnosis not present

## 2018-06-28 DIAGNOSIS — Z85828 Personal history of other malignant neoplasm of skin: Secondary | ICD-10-CM | POA: Diagnosis not present

## 2018-06-28 DIAGNOSIS — L570 Actinic keratosis: Secondary | ICD-10-CM | POA: Diagnosis not present

## 2018-06-28 DIAGNOSIS — D225 Melanocytic nevi of trunk: Secondary | ICD-10-CM | POA: Diagnosis not present

## 2018-06-28 DIAGNOSIS — D485 Neoplasm of uncertain behavior of skin: Secondary | ICD-10-CM | POA: Diagnosis not present

## 2018-06-28 DIAGNOSIS — D2262 Melanocytic nevi of left upper limb, including shoulder: Secondary | ICD-10-CM | POA: Diagnosis not present

## 2018-09-26 DIAGNOSIS — J410 Simple chronic bronchitis: Secondary | ICD-10-CM | POA: Diagnosis not present

## 2018-09-26 DIAGNOSIS — Z955 Presence of coronary angioplasty implant and graft: Secondary | ICD-10-CM | POA: Diagnosis not present

## 2018-09-26 DIAGNOSIS — E7800 Pure hypercholesterolemia, unspecified: Secondary | ICD-10-CM | POA: Diagnosis not present

## 2018-09-26 DIAGNOSIS — I214 Non-ST elevation (NSTEMI) myocardial infarction: Secondary | ICD-10-CM | POA: Diagnosis not present

## 2018-09-26 DIAGNOSIS — I493 Ventricular premature depolarization: Secondary | ICD-10-CM | POA: Diagnosis not present

## 2018-09-26 DIAGNOSIS — I2510 Atherosclerotic heart disease of native coronary artery without angina pectoris: Secondary | ICD-10-CM | POA: Diagnosis not present

## 2018-09-26 DIAGNOSIS — E119 Type 2 diabetes mellitus without complications: Secondary | ICD-10-CM | POA: Diagnosis not present

## 2018-09-26 DIAGNOSIS — Z9889 Other specified postprocedural states: Secondary | ICD-10-CM | POA: Diagnosis not present

## 2018-09-26 DIAGNOSIS — I10 Essential (primary) hypertension: Secondary | ICD-10-CM | POA: Diagnosis not present

## 2018-11-06 DIAGNOSIS — Z125 Encounter for screening for malignant neoplasm of prostate: Secondary | ICD-10-CM | POA: Diagnosis not present

## 2018-11-06 DIAGNOSIS — I10 Essential (primary) hypertension: Secondary | ICD-10-CM | POA: Diagnosis not present

## 2018-11-06 DIAGNOSIS — J410 Simple chronic bronchitis: Secondary | ICD-10-CM | POA: Diagnosis not present

## 2018-11-06 DIAGNOSIS — E119 Type 2 diabetes mellitus without complications: Secondary | ICD-10-CM | POA: Diagnosis not present

## 2018-11-06 DIAGNOSIS — E039 Hypothyroidism, unspecified: Secondary | ICD-10-CM | POA: Diagnosis not present

## 2018-11-06 DIAGNOSIS — E7800 Pure hypercholesterolemia, unspecified: Secondary | ICD-10-CM | POA: Diagnosis not present

## 2018-11-06 DIAGNOSIS — I2510 Atherosclerotic heart disease of native coronary artery without angina pectoris: Secondary | ICD-10-CM | POA: Diagnosis not present

## 2018-11-13 DIAGNOSIS — Z955 Presence of coronary angioplasty implant and graft: Secondary | ICD-10-CM | POA: Diagnosis not present

## 2018-11-13 DIAGNOSIS — R195 Other fecal abnormalities: Secondary | ICD-10-CM | POA: Diagnosis not present

## 2018-11-13 DIAGNOSIS — M160 Bilateral primary osteoarthritis of hip: Secondary | ICD-10-CM | POA: Diagnosis not present

## 2018-11-13 DIAGNOSIS — J410 Simple chronic bronchitis: Secondary | ICD-10-CM | POA: Diagnosis not present

## 2018-11-13 DIAGNOSIS — C6211 Malignant neoplasm of descended right testis: Secondary | ICD-10-CM | POA: Diagnosis not present

## 2018-11-13 DIAGNOSIS — M25552 Pain in left hip: Secondary | ICD-10-CM | POA: Diagnosis not present

## 2018-11-13 DIAGNOSIS — E039 Hypothyroidism, unspecified: Secondary | ICD-10-CM | POA: Diagnosis not present

## 2018-11-13 DIAGNOSIS — E1165 Type 2 diabetes mellitus with hyperglycemia: Secondary | ICD-10-CM | POA: Diagnosis not present

## 2018-11-13 DIAGNOSIS — D649 Anemia, unspecified: Secondary | ICD-10-CM | POA: Diagnosis not present

## 2018-11-23 DIAGNOSIS — M47816 Spondylosis without myelopathy or radiculopathy, lumbar region: Secondary | ICD-10-CM | POA: Diagnosis not present

## 2018-11-23 DIAGNOSIS — M1612 Unilateral primary osteoarthritis, left hip: Secondary | ICD-10-CM | POA: Diagnosis not present

## 2018-11-23 DIAGNOSIS — M25552 Pain in left hip: Secondary | ICD-10-CM | POA: Diagnosis not present

## 2018-11-30 DIAGNOSIS — Z85828 Personal history of other malignant neoplasm of skin: Secondary | ICD-10-CM | POA: Diagnosis not present

## 2018-11-30 DIAGNOSIS — D225 Melanocytic nevi of trunk: Secondary | ICD-10-CM | POA: Diagnosis not present

## 2018-11-30 DIAGNOSIS — Z08 Encounter for follow-up examination after completed treatment for malignant neoplasm: Secondary | ICD-10-CM | POA: Diagnosis not present

## 2018-11-30 DIAGNOSIS — D2262 Melanocytic nevi of left upper limb, including shoulder: Secondary | ICD-10-CM | POA: Diagnosis not present

## 2018-11-30 DIAGNOSIS — L218 Other seborrheic dermatitis: Secondary | ICD-10-CM | POA: Diagnosis not present

## 2018-11-30 DIAGNOSIS — L918 Other hypertrophic disorders of the skin: Secondary | ICD-10-CM | POA: Diagnosis not present

## 2018-11-30 DIAGNOSIS — D2261 Melanocytic nevi of right upper limb, including shoulder: Secondary | ICD-10-CM | POA: Diagnosis not present

## 2019-01-25 DIAGNOSIS — I10 Essential (primary) hypertension: Secondary | ICD-10-CM | POA: Diagnosis not present

## 2019-01-25 DIAGNOSIS — I493 Ventricular premature depolarization: Secondary | ICD-10-CM | POA: Diagnosis not present

## 2019-01-25 DIAGNOSIS — Z955 Presence of coronary angioplasty implant and graft: Secondary | ICD-10-CM | POA: Diagnosis not present

## 2019-01-25 DIAGNOSIS — E119 Type 2 diabetes mellitus without complications: Secondary | ICD-10-CM | POA: Diagnosis not present

## 2019-01-25 DIAGNOSIS — I2510 Atherosclerotic heart disease of native coronary artery without angina pectoris: Secondary | ICD-10-CM | POA: Diagnosis not present

## 2019-01-25 DIAGNOSIS — E7800 Pure hypercholesterolemia, unspecified: Secondary | ICD-10-CM | POA: Diagnosis not present

## 2019-02-27 DIAGNOSIS — M79605 Pain in left leg: Secondary | ICD-10-CM | POA: Diagnosis not present

## 2019-02-27 DIAGNOSIS — M25552 Pain in left hip: Secondary | ICD-10-CM | POA: Diagnosis not present

## 2019-02-27 DIAGNOSIS — Z8547 Personal history of malignant neoplasm of testis: Secondary | ICD-10-CM | POA: Diagnosis not present

## 2019-02-27 DIAGNOSIS — M5442 Lumbago with sciatica, left side: Secondary | ICD-10-CM | POA: Diagnosis not present

## 2019-02-27 DIAGNOSIS — M47816 Spondylosis without myelopathy or radiculopathy, lumbar region: Secondary | ICD-10-CM | POA: Diagnosis not present

## 2019-02-27 DIAGNOSIS — M1612 Unilateral primary osteoarthritis, left hip: Secondary | ICD-10-CM | POA: Diagnosis not present

## 2019-02-27 DIAGNOSIS — G8929 Other chronic pain: Secondary | ICD-10-CM | POA: Diagnosis not present

## 2019-02-28 ENCOUNTER — Other Ambulatory Visit: Payer: Self-pay | Admitting: Sports Medicine

## 2019-02-28 DIAGNOSIS — M79605 Pain in left leg: Secondary | ICD-10-CM | POA: Diagnosis not present

## 2019-02-28 DIAGNOSIS — M1612 Unilateral primary osteoarthritis, left hip: Secondary | ICD-10-CM

## 2019-02-28 DIAGNOSIS — M25552 Pain in left hip: Secondary | ICD-10-CM

## 2019-02-28 DIAGNOSIS — Z8547 Personal history of malignant neoplasm of testis: Secondary | ICD-10-CM

## 2019-03-13 ENCOUNTER — Other Ambulatory Visit: Payer: Self-pay | Admitting: Sports Medicine

## 2019-03-13 DIAGNOSIS — M25552 Pain in left hip: Secondary | ICD-10-CM

## 2019-03-13 DIAGNOSIS — Z8547 Personal history of malignant neoplasm of testis: Secondary | ICD-10-CM

## 2019-03-13 DIAGNOSIS — M1612 Unilateral primary osteoarthritis, left hip: Secondary | ICD-10-CM

## 2019-04-03 ENCOUNTER — Ambulatory Visit
Admission: RE | Admit: 2019-04-03 | Discharge: 2019-04-03 | Disposition: A | Discharge status: Home / Self Care | Payer: Medicare HMO | Source: Ambulatory Visit | Attending: Sports Medicine | Admitting: Sports Medicine

## 2019-04-03 ENCOUNTER — Other Ambulatory Visit: Payer: Self-pay

## 2019-04-03 DIAGNOSIS — M25552 Pain in left hip: Secondary | ICD-10-CM | POA: Diagnosis not present

## 2019-04-03 DIAGNOSIS — M948X5 Other specified disorders of cartilage, thigh: Secondary | ICD-10-CM | POA: Diagnosis not present

## 2019-04-03 DIAGNOSIS — M1612 Unilateral primary osteoarthritis, left hip: Secondary | ICD-10-CM | POA: Insufficient documentation

## 2019-04-03 DIAGNOSIS — Z8547 Personal history of malignant neoplasm of testis: Secondary | ICD-10-CM | POA: Diagnosis not present

## 2019-04-03 DIAGNOSIS — C6292 Malignant neoplasm of left testis, unspecified whether descended or undescended: Secondary | ICD-10-CM | POA: Diagnosis not present

## 2019-04-03 DIAGNOSIS — M5126 Other intervertebral disc displacement, lumbar region: Secondary | ICD-10-CM | POA: Diagnosis not present

## 2019-04-18 DIAGNOSIS — M5416 Radiculopathy, lumbar region: Secondary | ICD-10-CM | POA: Diagnosis not present

## 2019-04-18 DIAGNOSIS — M5126 Other intervertebral disc displacement, lumbar region: Secondary | ICD-10-CM | POA: Diagnosis not present

## 2019-04-30 DIAGNOSIS — J410 Simple chronic bronchitis: Secondary | ICD-10-CM | POA: Diagnosis not present

## 2019-04-30 DIAGNOSIS — I214 Non-ST elevation (NSTEMI) myocardial infarction: Secondary | ICD-10-CM | POA: Diagnosis not present

## 2019-04-30 DIAGNOSIS — I2510 Atherosclerotic heart disease of native coronary artery without angina pectoris: Secondary | ICD-10-CM | POA: Diagnosis not present

## 2019-04-30 DIAGNOSIS — E119 Type 2 diabetes mellitus without complications: Secondary | ICD-10-CM | POA: Diagnosis not present

## 2019-04-30 DIAGNOSIS — Z9889 Other specified postprocedural states: Secondary | ICD-10-CM | POA: Diagnosis not present

## 2019-04-30 DIAGNOSIS — I10 Essential (primary) hypertension: Secondary | ICD-10-CM | POA: Diagnosis not present

## 2019-04-30 DIAGNOSIS — E7800 Pure hypercholesterolemia, unspecified: Secondary | ICD-10-CM | POA: Diagnosis not present

## 2019-04-30 DIAGNOSIS — R079 Chest pain, unspecified: Secondary | ICD-10-CM | POA: Diagnosis not present

## 2019-04-30 DIAGNOSIS — I493 Ventricular premature depolarization: Secondary | ICD-10-CM | POA: Diagnosis not present

## 2019-05-29 DIAGNOSIS — M5126 Other intervertebral disc displacement, lumbar region: Secondary | ICD-10-CM | POA: Diagnosis not present

## 2019-05-29 DIAGNOSIS — M5416 Radiculopathy, lumbar region: Secondary | ICD-10-CM | POA: Diagnosis not present

## 2019-08-29 DIAGNOSIS — Z955 Presence of coronary angioplasty implant and graft: Secondary | ICD-10-CM | POA: Diagnosis not present

## 2019-08-29 DIAGNOSIS — I2510 Atherosclerotic heart disease of native coronary artery without angina pectoris: Secondary | ICD-10-CM | POA: Diagnosis not present

## 2019-08-29 DIAGNOSIS — R0602 Shortness of breath: Secondary | ICD-10-CM | POA: Diagnosis not present

## 2019-08-29 DIAGNOSIS — Z9889 Other specified postprocedural states: Secondary | ICD-10-CM | POA: Diagnosis not present

## 2019-08-29 DIAGNOSIS — I493 Ventricular premature depolarization: Secondary | ICD-10-CM | POA: Diagnosis not present

## 2019-08-29 DIAGNOSIS — E119 Type 2 diabetes mellitus without complications: Secondary | ICD-10-CM | POA: Diagnosis not present

## 2019-08-29 DIAGNOSIS — I10 Essential (primary) hypertension: Secondary | ICD-10-CM | POA: Diagnosis not present

## 2019-08-29 DIAGNOSIS — E7800 Pure hypercholesterolemia, unspecified: Secondary | ICD-10-CM | POA: Diagnosis not present

## 2019-09-17 DIAGNOSIS — I10 Essential (primary) hypertension: Secondary | ICD-10-CM | POA: Diagnosis not present

## 2019-09-17 DIAGNOSIS — Z125 Encounter for screening for malignant neoplasm of prostate: Secondary | ICD-10-CM | POA: Diagnosis not present

## 2019-09-17 DIAGNOSIS — E1165 Type 2 diabetes mellitus with hyperglycemia: Secondary | ICD-10-CM | POA: Diagnosis not present

## 2019-09-17 DIAGNOSIS — Z0000 Encounter for general adult medical examination without abnormal findings: Secondary | ICD-10-CM | POA: Diagnosis not present

## 2019-09-17 DIAGNOSIS — D649 Anemia, unspecified: Secondary | ICD-10-CM | POA: Diagnosis not present

## 2019-09-17 DIAGNOSIS — E039 Hypothyroidism, unspecified: Secondary | ICD-10-CM | POA: Diagnosis not present

## 2019-09-24 DIAGNOSIS — E039 Hypothyroidism, unspecified: Secondary | ICD-10-CM | POA: Diagnosis not present

## 2019-09-24 DIAGNOSIS — I2510 Atherosclerotic heart disease of native coronary artery without angina pectoris: Secondary | ICD-10-CM | POA: Diagnosis not present

## 2019-09-24 DIAGNOSIS — J449 Chronic obstructive pulmonary disease, unspecified: Secondary | ICD-10-CM | POA: Diagnosis not present

## 2019-09-24 DIAGNOSIS — I10 Essential (primary) hypertension: Secondary | ICD-10-CM | POA: Diagnosis not present

## 2019-09-24 DIAGNOSIS — E119 Type 2 diabetes mellitus without complications: Secondary | ICD-10-CM | POA: Diagnosis not present

## 2019-09-24 DIAGNOSIS — F330 Major depressive disorder, recurrent, mild: Secondary | ICD-10-CM | POA: Diagnosis not present

## 2019-09-24 DIAGNOSIS — D649 Anemia, unspecified: Secondary | ICD-10-CM | POA: Diagnosis not present

## 2019-09-24 DIAGNOSIS — Z0000 Encounter for general adult medical examination without abnormal findings: Secondary | ICD-10-CM | POA: Diagnosis not present

## 2019-09-24 DIAGNOSIS — Z6833 Body mass index (BMI) 33.0-33.9, adult: Secondary | ICD-10-CM | POA: Diagnosis not present

## 2019-11-12 IMAGING — MR MRI LUMBAR SPINE WITHOUT CONTRAST
8 of 10 series · 27 of 48 positions shown · non-contrast
Comparison: CT abdomen 11/20/2010

CLINICAL DATA: History of testicular cancer. Left hip pain
radiating down the left leg.

EXAM:
MRI LUMBAR SPINE WITHOUT CONTRAST
TECHNIQUE: Multiplanar, multisequence MR imaging of the lumbar spine was
performed. No intravenous contrast was administered.

[Series 5: T2 · sagittal · 4.0mm · 0.81mm/px · 2 of 17 slices shown (1 of 2)]
[im 1/17]
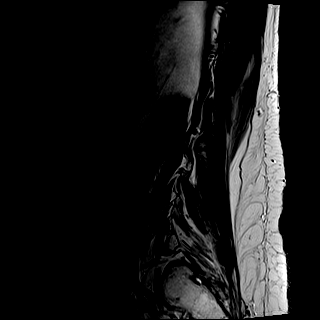
[im 17/17]
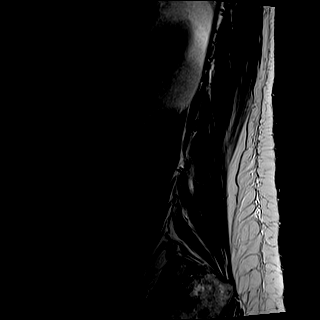

[Series 6: T1 · sagittal · 4.0mm · 0.81mm/px · 2 of 17 slices shown (1 of 4)]
[im 1/17]
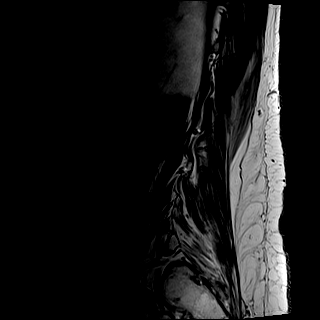
[im 17/17]
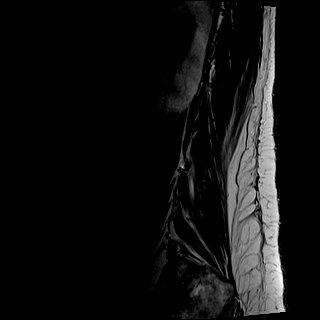

[Series 7: STIR · sagittal · 4.0mm · 0.41mm/px · 2 of 17 slices shown (1 of 2)]
[im 1/17]
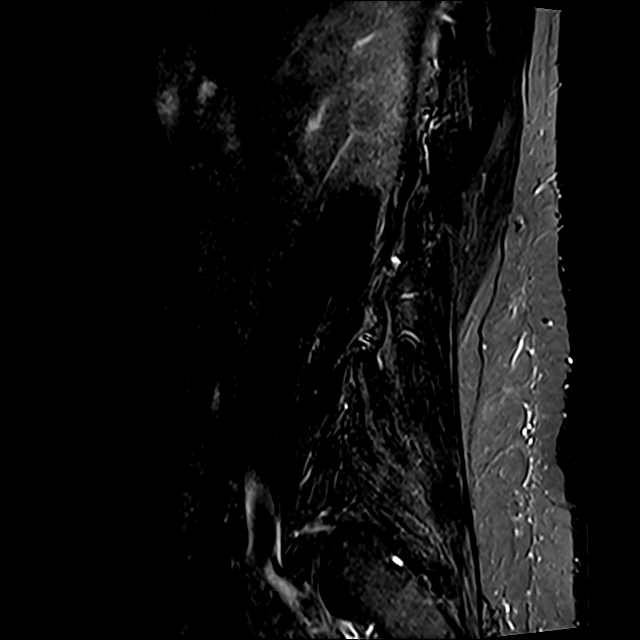
[im 17/17]
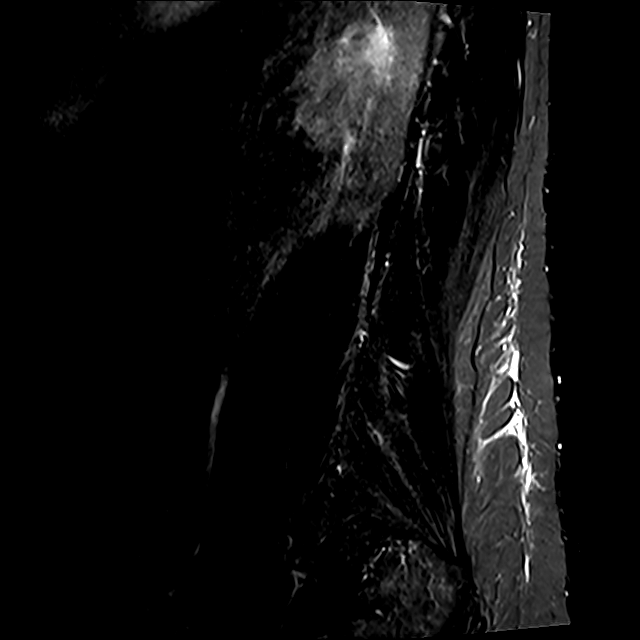

[Series 8: T2 · axial · 4.0mm · 0.78mm/px · z∈[-169,+12]mm · 4 of 29 slices shown (2 of 2)]
[im 1/29]
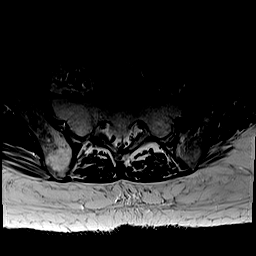
[im 10/29]
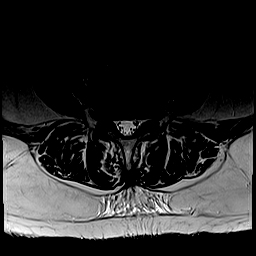
[im 19/29]
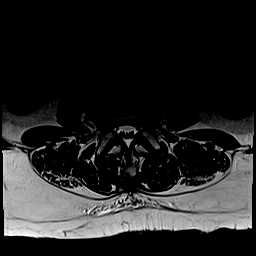
[im 29/29]
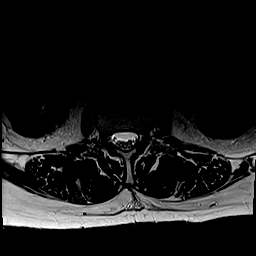

[Series 9: T1 · axial · 4.0mm · 0.39mm/px · z∈[-169,+12]mm · 4 of 29 slices shown (2 of 4)]
[im 1/29]
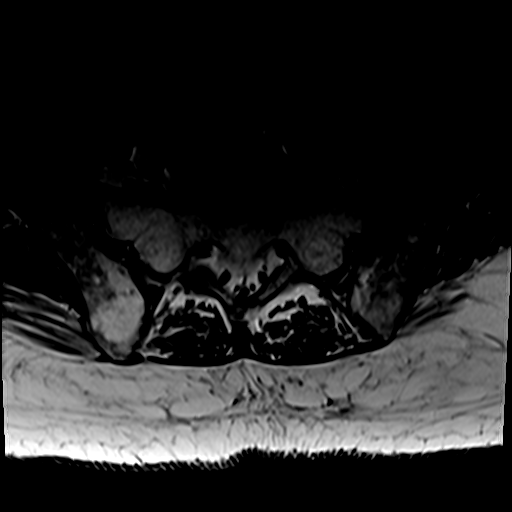
[im 10/29]
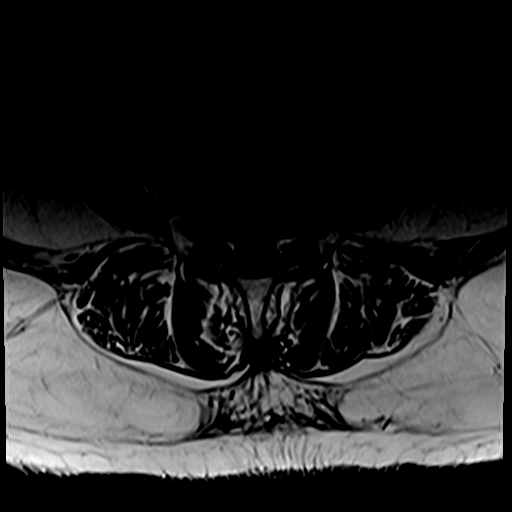
[im 19/29]
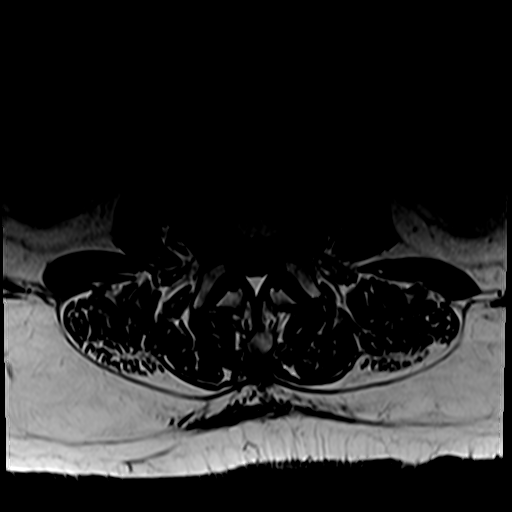
[im 29/29]
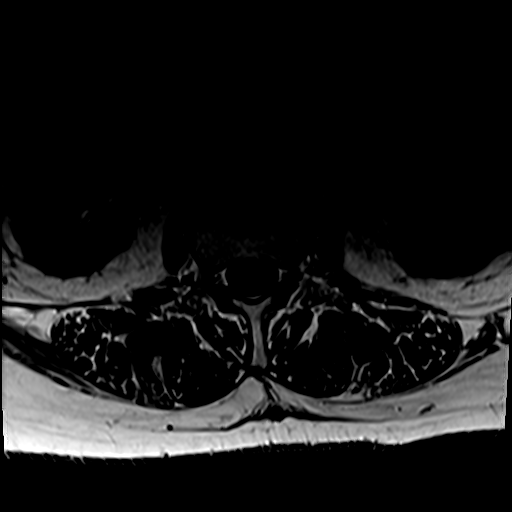

[Series 14: T1 · axial · 4.0mm · 0.74mm/px · z∈[-340,-105]mm · 7 of 48 slices shown (3 of 4)]
[im 1/48]
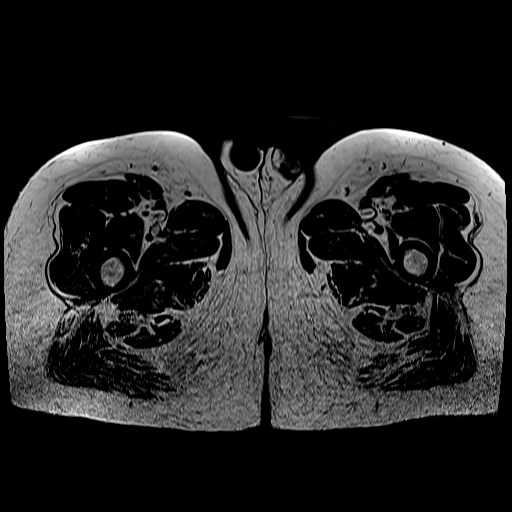
[im 8/48]
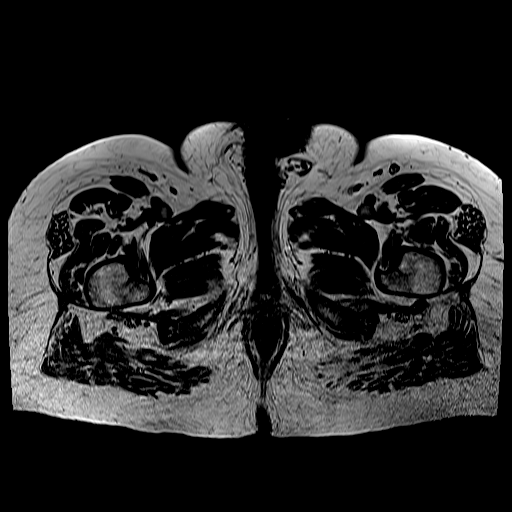
[im 16/48]
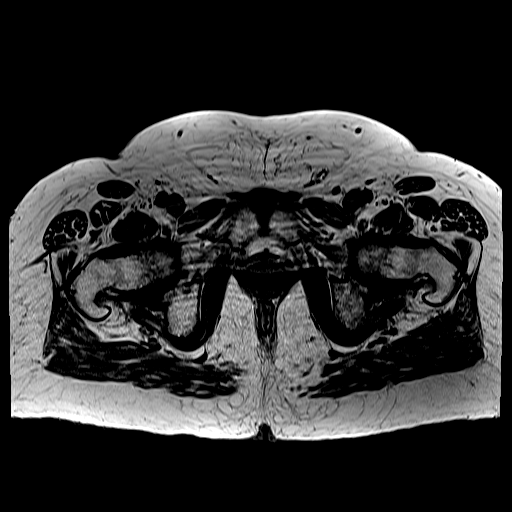
[im 24/48]
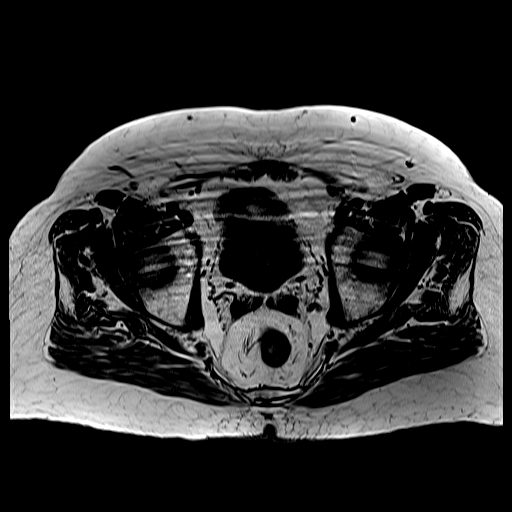
[im 32/48]
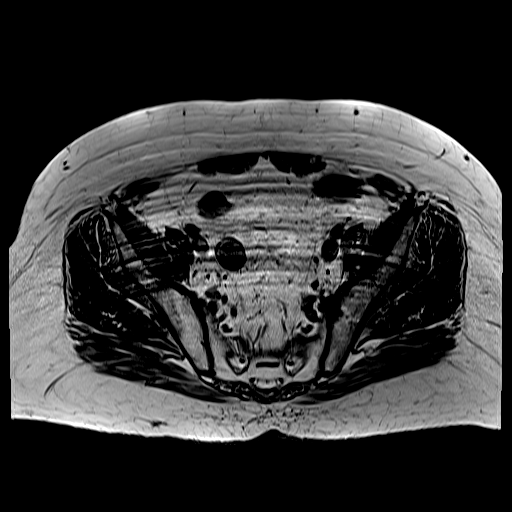
[im 40/48]
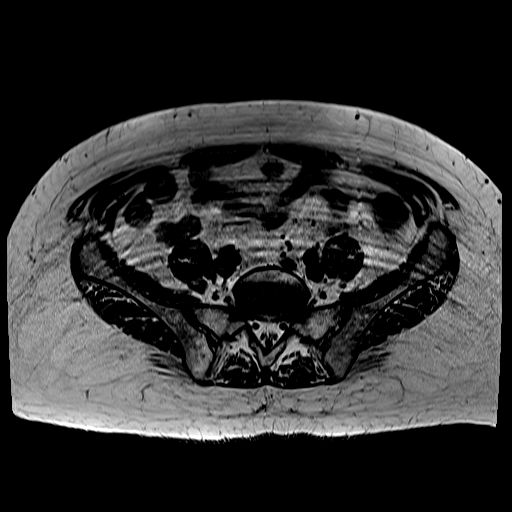
[im 48/48]
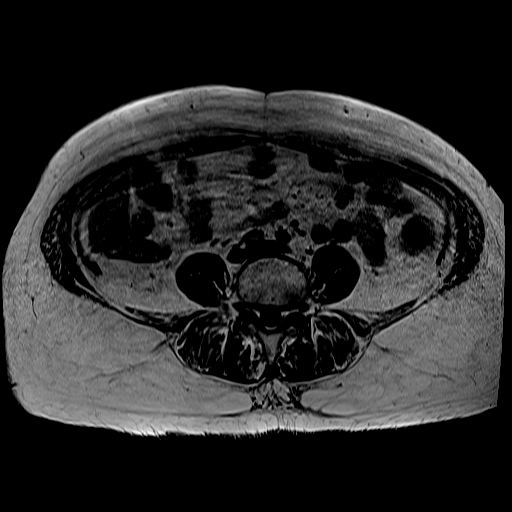

[Series 16: STIR · coronal · 4.0mm · 1.25mm/px · 1 of 35 slices shown (2 of 2)]
[im 1/35]
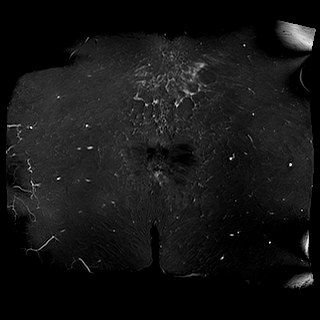

[Series 17: T1 · coronal · 4.0mm · 1.25mm/px · 5 of 35 slices shown (4 of 4)]
[im 1/35]
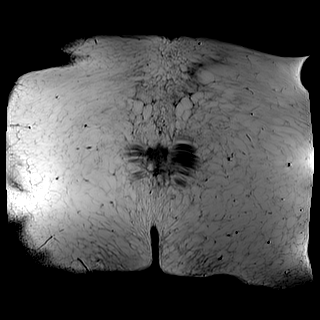
[im 9/35]
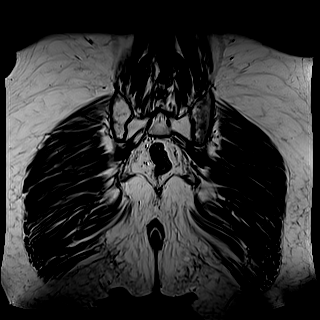
[im 18/35]
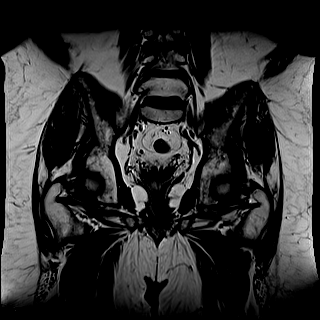
[im 26/35]
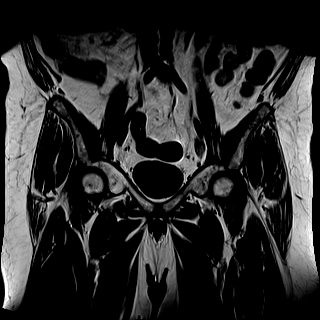
[im 35/35]
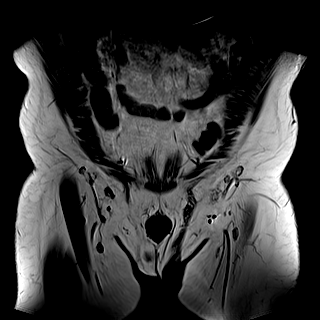

[27 of 48 positions shown; findings below may reference images not displayed]

FINDINGS: Segmentation:  5 lumbar type vertebral bodies.

Alignment:  Normal

Vertebrae: No evidence of fracture or metastatic disease. Widespread
fatty change suggesting previous radiation. Minimal anterior
inferior corner edema at L1 and L2 consistent with small Schmorl's
nodes.

Conus medullaris and cauda equina: Conus extends to the T12-L1
level. Conus and cauda equina appear normal.

Paraspinal and other soft tissues: Negative

Disc levels:

T12-L1: Normal.

L1-2: Mild noncompressive disc bulge.

L2-3: Moderate disc bulge. Mild narrowing of the lateral recesses
but no likely neural compression.

L3-4: Bulging of the disc. Left foraminal to extraforaminal disc
herniation likely to compress the left L3 nerve.

L4-5: Mild disc bulge.  No compressive stenosis.

L5-S1: Normal interspace.
IMPRESSION: L3-4: Left foraminal to extraforaminal disc herniation with a disc
fragment compressing the left L3 nerve.

Lesser, grossly non-compressive degenerative changes at the other
levels as noted above.

No evidence of metastatic disease.

## 2020-01-17 DIAGNOSIS — J410 Simple chronic bronchitis: Secondary | ICD-10-CM | POA: Diagnosis not present

## 2020-01-17 DIAGNOSIS — E1165 Type 2 diabetes mellitus with hyperglycemia: Secondary | ICD-10-CM | POA: Diagnosis not present

## 2020-01-17 DIAGNOSIS — I2510 Atherosclerotic heart disease of native coronary artery without angina pectoris: Secondary | ICD-10-CM | POA: Diagnosis not present

## 2020-01-17 DIAGNOSIS — E039 Hypothyroidism, unspecified: Secondary | ICD-10-CM | POA: Diagnosis not present

## 2020-01-17 DIAGNOSIS — E119 Type 2 diabetes mellitus without complications: Secondary | ICD-10-CM | POA: Diagnosis not present

## 2020-01-17 DIAGNOSIS — Z125 Encounter for screening for malignant neoplasm of prostate: Secondary | ICD-10-CM | POA: Diagnosis not present

## 2020-01-17 DIAGNOSIS — I10 Essential (primary) hypertension: Secondary | ICD-10-CM | POA: Diagnosis not present

## 2020-01-17 DIAGNOSIS — Z0000 Encounter for general adult medical examination without abnormal findings: Secondary | ICD-10-CM | POA: Diagnosis not present

## 2020-01-17 DIAGNOSIS — D649 Anemia, unspecified: Secondary | ICD-10-CM | POA: Diagnosis not present

## 2020-01-22 DIAGNOSIS — H25013 Cortical age-related cataract, bilateral: Secondary | ICD-10-CM | POA: Diagnosis not present

## 2020-01-22 DIAGNOSIS — H524 Presbyopia: Secondary | ICD-10-CM | POA: Diagnosis not present

## 2020-01-22 DIAGNOSIS — E119 Type 2 diabetes mellitus without complications: Secondary | ICD-10-CM | POA: Diagnosis not present

## 2020-01-22 DIAGNOSIS — E089 Diabetes mellitus due to underlying condition without complications: Secondary | ICD-10-CM | POA: Diagnosis not present

## 2020-01-24 DIAGNOSIS — I2510 Atherosclerotic heart disease of native coronary artery without angina pectoris: Secondary | ICD-10-CM | POA: Diagnosis not present

## 2020-01-24 DIAGNOSIS — I214 Non-ST elevation (NSTEMI) myocardial infarction: Secondary | ICD-10-CM | POA: Diagnosis not present

## 2020-01-24 DIAGNOSIS — R079 Chest pain, unspecified: Secondary | ICD-10-CM | POA: Diagnosis not present

## 2020-01-24 DIAGNOSIS — F320 Major depressive disorder, single episode, mild: Secondary | ICD-10-CM | POA: Diagnosis not present

## 2020-01-24 DIAGNOSIS — J410 Simple chronic bronchitis: Secondary | ICD-10-CM | POA: Diagnosis not present

## 2020-01-24 DIAGNOSIS — E119 Type 2 diabetes mellitus without complications: Secondary | ICD-10-CM | POA: Diagnosis not present

## 2020-01-24 DIAGNOSIS — E785 Hyperlipidemia, unspecified: Secondary | ICD-10-CM | POA: Diagnosis not present

## 2020-01-24 DIAGNOSIS — D649 Anemia, unspecified: Secondary | ICD-10-CM | POA: Diagnosis not present

## 2020-01-24 DIAGNOSIS — Z8547 Personal history of malignant neoplasm of testis: Secondary | ICD-10-CM | POA: Diagnosis not present

## 2020-01-24 DIAGNOSIS — I493 Ventricular premature depolarization: Secondary | ICD-10-CM | POA: Diagnosis not present

## 2020-01-24 DIAGNOSIS — Z9889 Other specified postprocedural states: Secondary | ICD-10-CM | POA: Diagnosis not present

## 2020-01-24 DIAGNOSIS — I10 Essential (primary) hypertension: Secondary | ICD-10-CM | POA: Diagnosis not present

## 2020-01-24 DIAGNOSIS — J449 Chronic obstructive pulmonary disease, unspecified: Secondary | ICD-10-CM | POA: Diagnosis not present

## 2020-01-24 DIAGNOSIS — E7800 Pure hypercholesterolemia, unspecified: Secondary | ICD-10-CM | POA: Diagnosis not present

## 2020-01-24 DIAGNOSIS — R0789 Other chest pain: Secondary | ICD-10-CM | POA: Diagnosis not present

## 2020-01-24 DIAGNOSIS — Z0000 Encounter for general adult medical examination without abnormal findings: Secondary | ICD-10-CM | POA: Diagnosis not present

## 2020-01-25 ENCOUNTER — Other Ambulatory Visit: Payer: Self-pay

## 2020-01-25 ENCOUNTER — Other Ambulatory Visit
Admission: RE | Admit: 2020-01-25 | Discharge: 2020-01-25 | Disposition: A | Discharge status: Home / Self Care | Payer: Medicare HMO | Source: Ambulatory Visit | Attending: Cardiology | Admitting: Cardiology

## 2020-01-25 DIAGNOSIS — Z01812 Encounter for preprocedural laboratory examination: Secondary | ICD-10-CM | POA: Diagnosis not present

## 2020-01-25 DIAGNOSIS — Z20822 Contact with and (suspected) exposure to covid-19: Secondary | ICD-10-CM | POA: Diagnosis not present

## 2020-01-25 LAB — SARS CORONAVIRUS 2 (TAT 6-24 HRS): SARS Coronavirus 2: NEGATIVE

## 2020-01-29 ENCOUNTER — Inpatient Hospital Stay
Admission: RE | Admit: 2020-01-29 | Discharge: 2020-01-30 | DRG: 247 | Disposition: A | Discharge status: Home / Self Care | Payer: Medicare HMO | Attending: Cardiology | Admitting: Cardiology

## 2020-01-29 ENCOUNTER — Encounter
Admission: RE | Disposition: A | Discharge status: Home / Self Care | Payer: Self-pay | Source: Home / Self Care | Attending: Cardiology

## 2020-01-29 ENCOUNTER — Other Ambulatory Visit: Payer: Self-pay

## 2020-01-29 ENCOUNTER — Encounter: Payer: Self-pay | Admitting: Cardiology

## 2020-01-29 DIAGNOSIS — I25110 Atherosclerotic heart disease of native coronary artery with unstable angina pectoris: Secondary | ICD-10-CM | POA: Diagnosis not present

## 2020-01-29 DIAGNOSIS — Z7984 Long term (current) use of oral hypoglycemic drugs: Secondary | ICD-10-CM

## 2020-01-29 DIAGNOSIS — Z87891 Personal history of nicotine dependence: Secondary | ICD-10-CM | POA: Diagnosis not present

## 2020-01-29 DIAGNOSIS — T82855A Stenosis of coronary artery stent, initial encounter: Principal | ICD-10-CM | POA: Diagnosis present

## 2020-01-29 DIAGNOSIS — Z7982 Long term (current) use of aspirin: Secondary | ICD-10-CM | POA: Diagnosis not present

## 2020-01-29 DIAGNOSIS — I10 Essential (primary) hypertension: Secondary | ICD-10-CM | POA: Diagnosis not present

## 2020-01-29 DIAGNOSIS — I200 Unstable angina: Secondary | ICD-10-CM | POA: Diagnosis not present

## 2020-01-29 DIAGNOSIS — E119 Type 2 diabetes mellitus without complications: Secondary | ICD-10-CM | POA: Diagnosis not present

## 2020-01-29 DIAGNOSIS — Z833 Family history of diabetes mellitus: Secondary | ICD-10-CM

## 2020-01-29 DIAGNOSIS — I252 Old myocardial infarction: Secondary | ICD-10-CM

## 2020-01-29 DIAGNOSIS — Z79899 Other long term (current) drug therapy: Secondary | ICD-10-CM

## 2020-01-29 DIAGNOSIS — J449 Chronic obstructive pulmonary disease, unspecified: Secondary | ICD-10-CM | POA: Diagnosis present

## 2020-01-29 DIAGNOSIS — R079 Chest pain, unspecified: Secondary | ICD-10-CM

## 2020-01-29 DIAGNOSIS — Z8249 Family history of ischemic heart disease and other diseases of the circulatory system: Secondary | ICD-10-CM

## 2020-01-29 DIAGNOSIS — E785 Hyperlipidemia, unspecified: Secondary | ICD-10-CM | POA: Diagnosis present

## 2020-01-29 DIAGNOSIS — Y831 Surgical operation with implant of artificial internal device as the cause of abnormal reaction of the patient, or of later complication, without mention of misadventure at the time of the procedure: Secondary | ICD-10-CM | POA: Diagnosis present

## 2020-01-29 HISTORY — PX: CORONARY BALLOON ANGIOPLASTY: CATH118233

## 2020-01-29 HISTORY — PX: LEFT HEART CATH AND CORONARY ANGIOGRAPHY: CATH118249

## 2020-01-29 LAB — GLUCOSE, CAPILLARY: Glucose-Capillary: 137 mg/dL — ABNORMAL HIGH (ref 70–99)

## 2020-01-29 LAB — POCT ACTIVATED CLOTTING TIME: Activated Clotting Time: 334 seconds

## 2020-01-29 SURGERY — LEFT HEART CATH AND CORONARY ANGIOGRAPHY
Anesthesia: Moderate Sedation

## 2020-01-29 MED ORDER — MIDAZOLAM HCL 2 MG/2ML IJ SOLN
INTRAMUSCULAR | Status: AC
  Filled 2020-01-29: qty 2

## 2020-01-29 MED ORDER — CLOPIDOGREL BISULFATE 75 MG PO TABS
75.0000 mg | ORAL_TABLET | Freq: Every day | ORAL | Status: DC
  Administered 2020-01-30: 75 mg via ORAL
  Filled 2020-01-29: qty 1

## 2020-01-29 MED ORDER — LABETALOL HCL 5 MG/ML IV SOLN: 10.0000 mg | INTRAVENOUS | Status: AC | PRN

## 2020-01-29 MED ORDER — MIDAZOLAM HCL 2 MG/2ML IJ SOLN
INTRAMUSCULAR | Status: DC | PRN
  Administered 2020-01-29: 1 mg via INTRAVENOUS

## 2020-01-29 MED ORDER — SODIUM CHLORIDE 0.9 % WEIGHT BASED INFUSION: 1.0000 mL/kg/h | INTRAVENOUS | Status: AC

## 2020-01-29 MED ORDER — SODIUM CHLORIDE 0.9 % IV SOLN: 250.0000 mL | INTRAVENOUS | Status: DC | PRN

## 2020-01-29 MED ORDER — LOSARTAN POTASSIUM 50 MG PO TABS
100.0000 mg | ORAL_TABLET | Freq: Every day | ORAL | Status: DC
  Administered 2020-01-29 – 2020-01-30 (×2): 100 mg via ORAL
  Filled 2020-01-29 (×2): qty 2

## 2020-01-29 MED ORDER — HEPARIN (PORCINE) IN NACL 1000-0.9 UT/500ML-% IV SOLN
INTRAVENOUS | Status: AC
  Filled 2020-01-29: qty 1000

## 2020-01-29 MED ORDER — CLOPIDOGREL BISULFATE 75 MG PO TABS
ORAL_TABLET | ORAL | Status: AC
  Filled 2020-01-29: qty 4

## 2020-01-29 MED ORDER — METOPROLOL TARTRATE 5 MG/5ML IV SOLN
INTRAVENOUS | Status: AC
  Filled 2020-01-29: qty 5

## 2020-01-29 MED ORDER — NITROGLYCERIN 1 MG/10 ML FOR IR/CATH LAB
INTRA_ARTERIAL | Status: DC | PRN
  Administered 2020-01-29 (×3): 200 ug via INTRACORONARY

## 2020-01-29 MED ORDER — ASPIRIN 81 MG PO CHEW
CHEWABLE_TABLET | ORAL | Status: AC
  Filled 2020-01-29: qty 3

## 2020-01-29 MED ORDER — HEPARIN SODIUM (PORCINE) 1000 UNIT/ML IJ SOLN
INTRAMUSCULAR | Status: AC
  Filled 2020-01-29: qty 1

## 2020-01-29 MED ORDER — NITROGLYCERIN 1 MG/10 ML FOR IR/CATH LAB
INTRA_ARTERIAL | Status: AC
  Filled 2020-01-29: qty 10

## 2020-01-29 MED ORDER — ASPIRIN 81 MG PO CHEW
CHEWABLE_TABLET | ORAL | Status: AC
  Filled 2020-01-29: qty 1

## 2020-01-29 MED ORDER — HEPARIN (PORCINE) IN NACL 1000-0.9 UT/500ML-% IV SOLN
INTRAVENOUS | Status: DC | PRN
  Administered 2020-01-29: 500 mL

## 2020-01-29 MED ORDER — SODIUM CHLORIDE 0.9 % WEIGHT BASED INFUSION: 1.0000 mL/kg/h | INTRAVENOUS | Status: DC

## 2020-01-29 MED ORDER — ASPIRIN 81 MG PO CHEW
81.0000 mg | CHEWABLE_TABLET | ORAL | Status: AC
  Administered 2020-01-29: 81 mg via ORAL

## 2020-01-29 MED ORDER — LEVOTHYROXINE SODIUM 75 MCG PO TABS
75.0000 ug | ORAL_TABLET | Freq: Every day | ORAL | Status: DC
  Administered 2020-01-29 – 2020-01-30 (×2): 75 ug via ORAL
  Filled 2020-01-29: qty 3
  Filled 2020-01-29 (×2): qty 1

## 2020-01-29 MED ORDER — ATORVASTATIN CALCIUM 80 MG PO TABS
80.0000 mg | ORAL_TABLET | Freq: Every day | ORAL | Status: DC
  Administered 2020-01-29: 80 mg via ORAL
  Filled 2020-01-29: qty 1

## 2020-01-29 MED ORDER — FENTANYL CITRATE (PF) 100 MCG/2ML IJ SOLN
INTRAMUSCULAR | Status: AC
  Filled 2020-01-29: qty 2

## 2020-01-29 MED ORDER — SODIUM CHLORIDE 0.9 % WEIGHT BASED INFUSION
3.0000 mL/kg/h | INTRAVENOUS | Status: AC
  Administered 2020-01-29: 3 mL/kg/h via INTRAVENOUS

## 2020-01-29 MED ORDER — ISOSORBIDE MONONITRATE ER 30 MG PO TB24
30.0000 mg | ORAL_TABLET | Freq: Every day | ORAL | Status: DC
  Administered 2020-01-30: 30 mg via ORAL
  Filled 2020-01-29: qty 1

## 2020-01-29 MED ORDER — IOHEXOL 300 MG/ML  SOLN
INTRAMUSCULAR | Status: DC | PRN
  Administered 2020-01-29: 220 mL

## 2020-01-29 MED ORDER — VERAPAMIL HCL 2.5 MG/ML IV SOLN
INTRAVENOUS | Status: DC | PRN
  Administered 2020-01-29: 2.5 mg via INTRA_ARTERIAL

## 2020-01-29 MED ORDER — COLCHICINE 0.6 MG PO TABS
0.6000 mg | ORAL_TABLET | Freq: Two times a day (BID) | ORAL | Status: DC
  Administered 2020-01-30: 0.6 mg via ORAL
  Filled 2020-01-29: qty 1

## 2020-01-29 MED ORDER — HYDRALAZINE HCL 20 MG/ML IJ SOLN: 10.0000 mg | INTRAMUSCULAR | Status: AC | PRN

## 2020-01-29 MED ORDER — ACETAMINOPHEN 325 MG PO TABS: 650.0000 mg | ORAL_TABLET | ORAL | Status: DC | PRN

## 2020-01-29 MED ORDER — SODIUM CHLORIDE 0.9% FLUSH
3.0000 mL | Freq: Two times a day (BID) | INTRAVENOUS | Status: DC
  Administered 2020-01-29: 3 mL via INTRAVENOUS

## 2020-01-29 MED ORDER — SODIUM CHLORIDE 0.9% FLUSH: 3.0000 mL | INTRAVENOUS | Status: DC | PRN

## 2020-01-29 MED ORDER — GLIMEPIRIDE 2 MG PO TABS
2.0000 mg | ORAL_TABLET | Freq: Every day | ORAL | Status: DC
  Administered 2020-01-30: 2 mg via ORAL
  Filled 2020-01-29: qty 1

## 2020-01-29 MED ORDER — ASPIRIN EC 81 MG PO TBEC
81.0000 mg | DELAYED_RELEASE_TABLET | Freq: Every day | ORAL | Status: DC
  Administered 2020-01-30: 81 mg via ORAL
  Filled 2020-01-29: qty 1

## 2020-01-29 MED ORDER — ONDANSETRON HCL 4 MG/2ML IJ SOLN: 4.0000 mg | Freq: Four times a day (QID) | INTRAMUSCULAR | Status: DC | PRN

## 2020-01-29 MED ORDER — ASPIRIN 81 MG PO CHEW
CHEWABLE_TABLET | ORAL | Status: DC | PRN
  Administered 2020-01-29: 243 mg via ORAL

## 2020-01-29 MED ORDER — CLOPIDOGREL BISULFATE 75 MG PO TABS
ORAL_TABLET | ORAL | Status: DC | PRN
  Administered 2020-01-29: 300 mg via ORAL

## 2020-01-29 MED ORDER — METOPROLOL TARTRATE 5 MG/5ML IV SOLN
INTRAVENOUS | Status: DC | PRN
  Administered 2020-01-29: 5 mg via INTRAVENOUS

## 2020-01-29 MED ORDER — VERAPAMIL HCL 2.5 MG/ML IV SOLN
INTRAVENOUS | Status: AC
  Filled 2020-01-29: qty 2

## 2020-01-29 MED ORDER — FENTANYL CITRATE (PF) 100 MCG/2ML IJ SOLN
INTRAMUSCULAR | Status: DC | PRN
  Administered 2020-01-29 (×2): 50 ug via INTRAVENOUS

## 2020-01-29 MED ORDER — METOPROLOL SUCCINATE ER 50 MG PO TB24
50.0000 mg | ORAL_TABLET | Freq: Every day | ORAL | Status: DC
  Administered 2020-01-30: 50 mg via ORAL
  Filled 2020-01-29: qty 1

## 2020-01-29 MED ORDER — HEPARIN SODIUM (PORCINE) 1000 UNIT/ML IJ SOLN
INTRAMUSCULAR | Status: DC | PRN
  Administered 2020-01-29: 7000 [IU] via INTRAVENOUS
  Administered 2020-01-29: 5000 [IU] via INTRAVENOUS

## 2020-01-29 SURGICAL SUPPLY — 14 items
BALLN TREK RX 2.75X15 (BALLOONS) ×3
BALLN ~~LOC~~ TREK RX 3.0X12 (BALLOONS) ×3
BALLOON TREK RX 2.75X15 (BALLOONS) ×2 IMPLANT
BALLOON ~~LOC~~ TREK RX 3.0X12 (BALLOONS) ×2 IMPLANT
CATH 5F 110X4 TIG (CATHETERS) ×3 IMPLANT
CATH VISTA GUIDE 6FR JR4 (CATHETERS) ×3 IMPLANT
DEVICE INFLAT 30 PLUS (MISCELLANEOUS) ×3 IMPLANT
DEVICE RAD TR BAND REGULAR (VASCULAR PRODUCTS) ×3 IMPLANT
GLIDESHEATH SLEND SS 6F .021 (SHEATH) ×3 IMPLANT
KIT MANI 3VAL PERCEP (MISCELLANEOUS) ×3 IMPLANT
PACK CARDIAC CATH (CUSTOM PROCEDURE TRAY) ×3 IMPLANT
STENT SYNERGY DES 2.75X16 (Permanent Stent) ×3 IMPLANT
WIRE ASAHI PROWATER 180CM (WIRE) ×3 IMPLANT
WIRE ROSEN-J .035X260CM (WIRE) ×3 IMPLANT

## 2020-01-30 MED ORDER — CLOPIDOGREL BISULFATE 75 MG PO TABS: 75.0000 mg | ORAL_TABLET | Freq: Every day | ORAL | 0 refills | Status: DC

## 2020-01-30 NOTE — Plan of Care (Signed)
  Problem: Health Behavior/Discharge Planning: Goal: Ability to safely manage health-related needs after discharge will improve 01/30/2020 1130 by Alen Blew, RN Outcome: Adequate for Discharge 01/30/2020 1129 by Alen Blew, RN Outcome: Adequate for Discharge   Problem: Cardiovascular: Goal: Ability to achieve and maintain adequate cardiovascular perfusion will improve 01/30/2020 1130 by Alen Blew, RN Outcome: Adequate for Discharge 01/30/2020 1129 by Alen Blew, RN Outcome: Adequate for Discharge Goal: Vascular access site(s) Level 0-1 will be maintained 01/30/2020 1130 by Alen Blew, RN Outcome: Adequate for Discharge 01/30/2020 1129 by Alen Blew, RN Outcome: Adequate for Discharge   Problem: Activity: Goal: Ability to return to baseline activity level will improve 01/30/2020 1130 by Alen Blew, RN Outcome: Adequate for Discharge 01/30/2020 1129 by Alen Blew, RN Outcome: Adequate for Discharge   Problem: Education: Goal: Understanding of CV disease, CV risk reduction, and recovery process will improve 01/30/2020 1130 by Alen Blew, RN Outcome: Adequate for Discharge 01/30/2020 1129 by Alen Blew, RN Outcome: Adequate for Discharge Goal: Individualized Educational Video(s) 01/30/2020 1130 by Alen Blew, RN Outcome: Adequate for Discharge 01/30/2020 1129 by Alen Blew, RN Outcome: Adequate for Discharge   Problem: Safety: Goal: Ability to remain free from injury will improve 01/30/2020 1130 by Alen Blew, RN Outcome: Adequate for Discharge 01/30/2020 1129 by Alen Blew, RN Outcome: Adequate for Discharge   Problem: Pain Managment: Goal: General experience of comfort will improve 01/30/2020 1130 by Alen Blew, RN Outcome: Adequate for Discharge 01/30/2020 1129 by Alen Blew, RN Outcome: Adequate for Discharge   Problem: Skin Integrity: Goal: Risk for impaired skin integrity will  decrease 01/30/2020 1130 by Alen Blew, RN Outcome: Adequate for Discharge 01/30/2020 1129 by Alen Blew, RN Outcome: Adequate for Discharge

## 2020-01-30 NOTE — Plan of Care (Signed)
  Problem: Education: Goal: Knowledge of General Education information will improve Description: Including pain rating scale, medication(s)/side effects and non-pharmacologic comfort measures Outcome: Adequate for Discharge   Problem: Health Behavior/Discharge Planning: Goal: Ability to manage health-related needs will improve Outcome: Adequate for Discharge   Problem: Clinical Measurements: Goal: Ability to maintain clinical measurements within normal limits will improve Outcome: Adequate for Discharge Goal: Will remain free from infection Outcome: Adequate for Discharge Goal: Diagnostic test results will improve Outcome: Adequate for Discharge Goal: Respiratory complications will improve Outcome: Adequate for Discharge Goal: Cardiovascular complication will be avoided Outcome: Adequate for Discharge   Problem: Activity: Goal: Risk for activity intolerance will decrease Outcome: Adequate for Discharge   Problem: Nutrition: Goal: Adequate nutrition will be maintained Outcome: Adequate for Discharge   Problem: Elimination: Goal: Will not experience complications related to bowel motility Outcome: Adequate for Discharge Goal: Will not experience complications related to urinary retention Outcome: Adequate for Discharge   Problem: Elimination: Goal: Will not experience complications related to bowel motility Outcome: Adequate for Discharge Goal: Will not experience complications related to urinary retention Outcome: Adequate for Discharge   Problem: Pain Managment: Goal: General experience of comfort will improve Outcome: Adequate for Discharge   Problem: Safety: Goal: Ability to remain free from injury will improve Outcome: Adequate for Discharge   Problem: Education: Goal: Understanding of CV disease, CV risk reduction, and recovery process will improve Outcome: Adequate for Discharge Goal: Individualized Educational Video(s) Outcome: Adequate for Discharge    Problem: Activity: Goal: Ability to return to baseline activity level will improve Outcome: Adequate for Discharge   Problem: Cardiovascular: Goal: Ability to achieve and maintain adequate cardiovascular perfusion will improve Outcome: Adequate for Discharge Goal: Vascular access site(s) Level 0-1 will be maintained Outcome: Adequate for Discharge   Problem: Health Behavior/Discharge Planning: Goal: Ability to safely manage health-related needs after discharge will improve Outcome: Adequate for Discharge

## 2020-01-30 NOTE — Discharge Summary (Signed)
Physician Discharge Summary  Patient ID: Jason Mclean. MRN: [id number] DOB/AGE: [birth year] 74 y.o.  Admit date: 01/29/2020 Discharge date: 01/30/2020  Primary Discharge Diagnosis unstable angina Secondary Discharge Diagnosis coronary artery disease  Significant Diagnostic Studies: yes  Consults: None  Hospital Course: The patient underwent elective cardiac catheterization on 01/29/2020, which revealed high-grade in-stent restenosis in the mid to distal RCA, 75% stenosis OM 2, 80% stenosis in the mid right posterior lateral branch.  The patient underwent PCI, receiving DES for in-stent restenosis mid to distal RCA with excellent angiographic result.  There were no periprocedural complications.  The patient was observed overnight on telemetry where he had an uncomplicated hospital course.  On the morning of 01/30/2020, the patient was ambulating without difficulty, and without recurrent chest pain.  Patient was discharged home in stable condition and is scheduled for follow-up in 1 week.   Discharge Exam: Blood pressure (!) 146/71, pulse 65, temperature 97.7 F (36.5 C), temperature source Oral, resp. rate 19, height 5' 9.5" (1.765 m), weight 100.7 kg, SpO2 97 %.   General appearance: alert Head: Normocephalic, without obvious abnormality, atraumatic Eyes: conjunctivae/corneas clear. PERRL, EOM's intact. Fundi benign. Ears: normal TM's and external ear canals both ears Nose: Nares normal. Septum midline. Mucosa normal. No drainage or sinus tenderness. Throat: lips, mucosa, and tongue normal; teeth and gums normal Neck: no adenopathy, no carotid bruit, no JVD, supple, symmetrical, trachea midline and thyroid not enlarged, symmetric, no tenderness/mass/nodules Back: symmetric, no curvature. ROM normal. No CVA tenderness. Resp: clear to auscultation bilaterally Chest wall: no tenderness Cardio: regular rate and rhythm, S1, S2 normal, no murmur, click, rub or gallop GI: soft, non-tender;  bowel sounds normal; no masses,  no organomegaly Extremities: extremities normal, atraumatic, no cyanosis or edema Pulses: 2+ and symmetric Skin: Skin color, texture, turgor normal. No rashes or lesions Lymph nodes: Cervical, supraclavicular, and axillary nodes normal. Neurologic: Grossly normal Incision/Wound: well healed Labs:   Lab Results  Component Value Date   WBC 8.2 06/17/2018   HGB 13.2 06/17/2018   HCT 39.1 (L) 06/17/2018   MCV 89.2 06/17/2018   PLT 211 06/17/2018   No results for input(s): NA, K, CL, CO2, BUN, CREATININE, CALCIUM, PROT, BILITOT, ALKPHOS, ALT, AST, GLUCOSE in the last 168 hours.  Invalid input(s): LABALBU    Radiology: EKG: Normal sinus  FOLLOW UP PLANS AND APPOINTMENTS  Allergies as of 01/30/2020   No Known Allergies     Medication List    TAKE these medications   aspirin EC 81 MG tablet Take 81 mg by mouth daily.   atorvastatin 80 MG tablet Commonly known as: LIPITOR Take 1 tablet (80 mg total) by mouth daily at 6 PM.   clopidogrel 75 MG tablet Commonly known as: PLAVIX Take 1 tablet (75 mg total) by mouth daily with breakfast.   Coenzyme Q10 100 MG Tabs Take 100 mg by mouth every other day.   colchicine 0.6 MG tablet Take 1 tablet (0.6 mg total) by mouth 2 (two) times daily for 7 days.   glimepiride 2 MG tablet Commonly known as: AMARYL Take 2 mg by mouth daily.   isosorbide mononitrate 30 MG 24 hr tablet Commonly known as: IMDUR Take 1 tablet (30 mg total) by mouth daily.   levothyroxine 75 MCG tablet Commonly known as: SYNTHROID Take 75 mcg by mouth daily before breakfast.   losartan 50 MG tablet Commonly known as: COZAAR Take 50 mg by mouth daily.   metFORMIN 1000 MG tablet  Commonly known as: GLUCOPHAGE Take 1,000 mg by mouth 2 (two) times daily with a meal.   metoprolol succinate 50 MG 24 hr tablet Commonly known as: TOPROL-XL Take 50 mg by mouth daily.   multivitamin with minerals Tabs tablet Take 1 tablet by  mouth daily. Centrum   SALMON OIL-1000 PO Take 1,000 mg by mouth daily.   Vitamin D3 50 MCG (2000 UT) Tabs Take 2,000 Units by mouth daily.      Follow-up Information    Dajsha Massaro, MD Follow up in 1 week(s).   Specialty: Cardiology Contact information: [facility] [place] [address] [phone]           BRING ALL MEDICATIONS WITH YOU TO FOLLOW UP APPOINTMENTS  Time spent with patient to include physician time: 25 minutes Signed:  Isaias Cowman MD, PhD, [facility] 01/30/2020, 8:00 AM

## 2020-02-07 DIAGNOSIS — J410 Simple chronic bronchitis: Secondary | ICD-10-CM | POA: Diagnosis not present

## 2020-02-07 DIAGNOSIS — R079 Chest pain, unspecified: Secondary | ICD-10-CM | POA: Diagnosis not present

## 2020-02-07 DIAGNOSIS — Z9889 Other specified postprocedural states: Secondary | ICD-10-CM | POA: Diagnosis not present

## 2020-02-07 DIAGNOSIS — I2510 Atherosclerotic heart disease of native coronary artery without angina pectoris: Secondary | ICD-10-CM | POA: Diagnosis not present

## 2020-02-07 DIAGNOSIS — I493 Ventricular premature depolarization: Secondary | ICD-10-CM | POA: Diagnosis not present

## 2020-02-07 DIAGNOSIS — E7800 Pure hypercholesterolemia, unspecified: Secondary | ICD-10-CM | POA: Diagnosis not present

## 2020-02-07 DIAGNOSIS — I214 Non-ST elevation (NSTEMI) myocardial infarction: Secondary | ICD-10-CM | POA: Diagnosis not present

## 2020-02-07 DIAGNOSIS — E119 Type 2 diabetes mellitus without complications: Secondary | ICD-10-CM | POA: Diagnosis not present

## 2020-02-07 DIAGNOSIS — I10 Essential (primary) hypertension: Secondary | ICD-10-CM | POA: Diagnosis not present

## 2020-04-22 DIAGNOSIS — L298 Other pruritus: Secondary | ICD-10-CM | POA: Diagnosis not present

## 2020-04-22 DIAGNOSIS — D2271 Melanocytic nevi of right lower limb, including hip: Secondary | ICD-10-CM | POA: Diagnosis not present

## 2020-04-22 DIAGNOSIS — L820 Inflamed seborrheic keratosis: Secondary | ICD-10-CM | POA: Diagnosis not present

## 2020-04-22 DIAGNOSIS — D2262 Melanocytic nevi of left upper limb, including shoulder: Secondary | ICD-10-CM | POA: Diagnosis not present

## 2020-04-22 DIAGNOSIS — D225 Melanocytic nevi of trunk: Secondary | ICD-10-CM | POA: Diagnosis not present

## 2020-04-22 DIAGNOSIS — D2261 Melanocytic nevi of right upper limb, including shoulder: Secondary | ICD-10-CM | POA: Diagnosis not present

## 2020-04-22 DIAGNOSIS — Z85828 Personal history of other malignant neoplasm of skin: Secondary | ICD-10-CM | POA: Diagnosis not present

## 2020-05-09 DIAGNOSIS — I10 Essential (primary) hypertension: Secondary | ICD-10-CM | POA: Diagnosis not present

## 2020-05-09 DIAGNOSIS — Z9889 Other specified postprocedural states: Secondary | ICD-10-CM | POA: Diagnosis not present

## 2020-05-09 DIAGNOSIS — R079 Chest pain, unspecified: Secondary | ICD-10-CM | POA: Diagnosis not present

## 2020-05-09 DIAGNOSIS — I2510 Atherosclerotic heart disease of native coronary artery without angina pectoris: Secondary | ICD-10-CM | POA: Diagnosis not present

## 2020-05-09 DIAGNOSIS — R0602 Shortness of breath: Secondary | ICD-10-CM | POA: Diagnosis not present

## 2020-05-09 DIAGNOSIS — I214 Non-ST elevation (NSTEMI) myocardial infarction: Secondary | ICD-10-CM | POA: Diagnosis not present

## 2020-05-09 DIAGNOSIS — J410 Simple chronic bronchitis: Secondary | ICD-10-CM | POA: Diagnosis not present

## 2020-05-09 DIAGNOSIS — I493 Ventricular premature depolarization: Secondary | ICD-10-CM | POA: Diagnosis not present

## 2020-05-09 DIAGNOSIS — E119 Type 2 diabetes mellitus without complications: Secondary | ICD-10-CM | POA: Diagnosis not present

## 2020-05-21 DIAGNOSIS — E7800 Pure hypercholesterolemia, unspecified: Secondary | ICD-10-CM | POA: Diagnosis not present

## 2020-05-21 DIAGNOSIS — E119 Type 2 diabetes mellitus without complications: Secondary | ICD-10-CM | POA: Diagnosis not present

## 2020-05-21 DIAGNOSIS — Z955 Presence of coronary angioplasty implant and graft: Secondary | ICD-10-CM | POA: Diagnosis not present

## 2020-05-21 DIAGNOSIS — D649 Anemia, unspecified: Secondary | ICD-10-CM | POA: Diagnosis not present

## 2020-05-21 DIAGNOSIS — I2510 Atherosclerotic heart disease of native coronary artery without angina pectoris: Secondary | ICD-10-CM | POA: Diagnosis not present

## 2020-05-21 DIAGNOSIS — R079 Chest pain, unspecified: Secondary | ICD-10-CM | POA: Diagnosis not present

## 2020-05-21 DIAGNOSIS — I10 Essential (primary) hypertension: Secondary | ICD-10-CM | POA: Diagnosis not present

## 2020-05-21 DIAGNOSIS — I214 Non-ST elevation (NSTEMI) myocardial infarction: Secondary | ICD-10-CM | POA: Diagnosis not present

## 2020-05-28 DIAGNOSIS — E039 Hypothyroidism, unspecified: Secondary | ICD-10-CM | POA: Diagnosis not present

## 2020-05-28 DIAGNOSIS — E119 Type 2 diabetes mellitus without complications: Secondary | ICD-10-CM | POA: Diagnosis not present

## 2020-05-28 DIAGNOSIS — E663 Overweight: Secondary | ICD-10-CM | POA: Diagnosis not present

## 2020-05-28 DIAGNOSIS — Z79899 Other long term (current) drug therapy: Secondary | ICD-10-CM | POA: Diagnosis not present

## 2020-05-28 DIAGNOSIS — E785 Hyperlipidemia, unspecified: Secondary | ICD-10-CM | POA: Diagnosis not present

## 2020-05-28 DIAGNOSIS — Z6833 Body mass index (BMI) 33.0-33.9, adult: Secondary | ICD-10-CM | POA: Diagnosis not present

## 2020-05-28 DIAGNOSIS — I2510 Atherosclerotic heart disease of native coronary artery without angina pectoris: Secondary | ICD-10-CM | POA: Diagnosis not present

## 2020-05-28 DIAGNOSIS — J449 Chronic obstructive pulmonary disease, unspecified: Secondary | ICD-10-CM | POA: Diagnosis not present

## 2020-05-28 DIAGNOSIS — I10 Essential (primary) hypertension: Secondary | ICD-10-CM | POA: Diagnosis not present

## 2020-09-10 DIAGNOSIS — I2510 Atherosclerotic heart disease of native coronary artery without angina pectoris: Secondary | ICD-10-CM | POA: Diagnosis not present

## 2020-09-10 DIAGNOSIS — E119 Type 2 diabetes mellitus without complications: Secondary | ICD-10-CM | POA: Diagnosis not present

## 2020-09-10 DIAGNOSIS — Z9889 Other specified postprocedural states: Secondary | ICD-10-CM | POA: Diagnosis not present

## 2020-09-10 DIAGNOSIS — J410 Simple chronic bronchitis: Secondary | ICD-10-CM | POA: Diagnosis not present

## 2020-09-10 DIAGNOSIS — I10 Essential (primary) hypertension: Secondary | ICD-10-CM | POA: Diagnosis not present

## 2020-09-10 DIAGNOSIS — I493 Ventricular premature depolarization: Secondary | ICD-10-CM | POA: Diagnosis not present

## 2020-09-10 DIAGNOSIS — R002 Palpitations: Secondary | ICD-10-CM | POA: Diagnosis not present

## 2020-09-10 DIAGNOSIS — I214 Non-ST elevation (NSTEMI) myocardial infarction: Secondary | ICD-10-CM | POA: Diagnosis not present

## 2020-09-10 DIAGNOSIS — E7800 Pure hypercholesterolemia, unspecified: Secondary | ICD-10-CM | POA: Diagnosis not present

## 2020-10-06 DIAGNOSIS — E039 Hypothyroidism, unspecified: Secondary | ICD-10-CM | POA: Diagnosis not present

## 2020-10-06 DIAGNOSIS — I10 Essential (primary) hypertension: Secondary | ICD-10-CM | POA: Diagnosis not present

## 2020-10-06 DIAGNOSIS — Z79899 Other long term (current) drug therapy: Secondary | ICD-10-CM | POA: Diagnosis not present

## 2020-10-06 DIAGNOSIS — E119 Type 2 diabetes mellitus without complications: Secondary | ICD-10-CM | POA: Diagnosis not present

## 2020-10-06 DIAGNOSIS — E7800 Pure hypercholesterolemia, unspecified: Secondary | ICD-10-CM | POA: Diagnosis not present

## 2020-10-06 DIAGNOSIS — I2510 Atherosclerotic heart disease of native coronary artery without angina pectoris: Secondary | ICD-10-CM | POA: Diagnosis not present

## 2020-10-06 DIAGNOSIS — D649 Anemia, unspecified: Secondary | ICD-10-CM | POA: Diagnosis not present

## 2020-10-06 DIAGNOSIS — Z955 Presence of coronary angioplasty implant and graft: Secondary | ICD-10-CM | POA: Diagnosis not present

## 2020-10-13 DIAGNOSIS — Z955 Presence of coronary angioplasty implant and graft: Secondary | ICD-10-CM | POA: Diagnosis not present

## 2020-10-13 DIAGNOSIS — E119 Type 2 diabetes mellitus without complications: Secondary | ICD-10-CM | POA: Diagnosis not present

## 2020-10-13 DIAGNOSIS — I10 Essential (primary) hypertension: Secondary | ICD-10-CM | POA: Diagnosis not present

## 2020-10-13 DIAGNOSIS — Z8547 Personal history of malignant neoplasm of testis: Secondary | ICD-10-CM | POA: Diagnosis not present

## 2020-10-13 DIAGNOSIS — J449 Chronic obstructive pulmonary disease, unspecified: Secondary | ICD-10-CM | POA: Diagnosis not present

## 2020-10-13 DIAGNOSIS — E039 Hypothyroidism, unspecified: Secondary | ICD-10-CM | POA: Diagnosis not present

## 2020-10-13 DIAGNOSIS — Z79899 Other long term (current) drug therapy: Secondary | ICD-10-CM | POA: Diagnosis not present

## 2020-10-13 DIAGNOSIS — D649 Anemia, unspecified: Secondary | ICD-10-CM | POA: Diagnosis not present

## 2020-10-13 DIAGNOSIS — Z0000 Encounter for general adult medical examination without abnormal findings: Secondary | ICD-10-CM | POA: Diagnosis not present

## 2021-01-13 DIAGNOSIS — I10 Essential (primary) hypertension: Secondary | ICD-10-CM | POA: Diagnosis not present

## 2021-01-13 DIAGNOSIS — I493 Ventricular premature depolarization: Secondary | ICD-10-CM | POA: Diagnosis not present

## 2021-01-13 DIAGNOSIS — Z955 Presence of coronary angioplasty implant and graft: Secondary | ICD-10-CM | POA: Diagnosis not present

## 2021-01-13 DIAGNOSIS — R0602 Shortness of breath: Secondary | ICD-10-CM | POA: Diagnosis not present

## 2021-01-13 DIAGNOSIS — Z9889 Other specified postprocedural states: Secondary | ICD-10-CM | POA: Diagnosis not present

## 2021-01-13 DIAGNOSIS — J410 Simple chronic bronchitis: Secondary | ICD-10-CM | POA: Diagnosis not present

## 2021-01-13 DIAGNOSIS — R079 Chest pain, unspecified: Secondary | ICD-10-CM | POA: Diagnosis not present

## 2021-01-13 DIAGNOSIS — I214 Non-ST elevation (NSTEMI) myocardial infarction: Secondary | ICD-10-CM | POA: Diagnosis not present

## 2021-01-13 DIAGNOSIS — Z23 Encounter for immunization: Secondary | ICD-10-CM | POA: Diagnosis not present

## 2021-02-03 DIAGNOSIS — E039 Hypothyroidism, unspecified: Secondary | ICD-10-CM | POA: Diagnosis not present

## 2021-02-03 DIAGNOSIS — Z125 Encounter for screening for malignant neoplasm of prostate: Secondary | ICD-10-CM | POA: Diagnosis not present

## 2021-02-03 DIAGNOSIS — Z0000 Encounter for general adult medical examination without abnormal findings: Secondary | ICD-10-CM | POA: Diagnosis not present

## 2021-02-03 DIAGNOSIS — E1165 Type 2 diabetes mellitus with hyperglycemia: Secondary | ICD-10-CM | POA: Diagnosis not present

## 2021-02-03 DIAGNOSIS — Z955 Presence of coronary angioplasty implant and graft: Secondary | ICD-10-CM | POA: Diagnosis not present

## 2021-02-03 DIAGNOSIS — J410 Simple chronic bronchitis: Secondary | ICD-10-CM | POA: Diagnosis not present

## 2021-02-03 DIAGNOSIS — I10 Essential (primary) hypertension: Secondary | ICD-10-CM | POA: Diagnosis not present

## 2021-02-03 DIAGNOSIS — C6211 Malignant neoplasm of descended right testis: Secondary | ICD-10-CM | POA: Diagnosis not present

## 2021-02-03 DIAGNOSIS — D649 Anemia, unspecified: Secondary | ICD-10-CM | POA: Diagnosis not present

## 2021-02-10 DIAGNOSIS — J449 Chronic obstructive pulmonary disease, unspecified: Secondary | ICD-10-CM | POA: Diagnosis not present

## 2021-02-10 DIAGNOSIS — E039 Hypothyroidism, unspecified: Secondary | ICD-10-CM | POA: Diagnosis not present

## 2021-02-10 DIAGNOSIS — Z8547 Personal history of malignant neoplasm of testis: Secondary | ICD-10-CM | POA: Diagnosis not present

## 2021-02-10 DIAGNOSIS — D649 Anemia, unspecified: Secondary | ICD-10-CM | POA: Diagnosis not present

## 2021-02-10 DIAGNOSIS — Z955 Presence of coronary angioplasty implant and graft: Secondary | ICD-10-CM | POA: Diagnosis not present

## 2021-02-10 DIAGNOSIS — E119 Type 2 diabetes mellitus without complications: Secondary | ICD-10-CM | POA: Diagnosis not present

## 2021-02-10 DIAGNOSIS — I2510 Atherosclerotic heart disease of native coronary artery without angina pectoris: Secondary | ICD-10-CM | POA: Diagnosis not present

## 2021-02-10 DIAGNOSIS — Z79899 Other long term (current) drug therapy: Secondary | ICD-10-CM | POA: Diagnosis not present

## 2021-02-10 DIAGNOSIS — Z0000 Encounter for general adult medical examination without abnormal findings: Secondary | ICD-10-CM | POA: Diagnosis not present

## 2021-03-26 DIAGNOSIS — D2261 Melanocytic nevi of right upper limb, including shoulder: Secondary | ICD-10-CM | POA: Diagnosis not present

## 2021-03-26 DIAGNOSIS — R58 Hemorrhage, not elsewhere classified: Secondary | ICD-10-CM | POA: Diagnosis not present

## 2021-03-26 DIAGNOSIS — L820 Inflamed seborrheic keratosis: Secondary | ICD-10-CM | POA: Diagnosis not present

## 2021-03-26 DIAGNOSIS — L570 Actinic keratosis: Secondary | ICD-10-CM | POA: Diagnosis not present

## 2021-03-26 DIAGNOSIS — L538 Other specified erythematous conditions: Secondary | ICD-10-CM | POA: Diagnosis not present

## 2021-03-26 DIAGNOSIS — D225 Melanocytic nevi of trunk: Secondary | ICD-10-CM | POA: Diagnosis not present

## 2021-03-26 DIAGNOSIS — D2271 Melanocytic nevi of right lower limb, including hip: Secondary | ICD-10-CM | POA: Diagnosis not present

## 2021-03-26 DIAGNOSIS — Z85828 Personal history of other malignant neoplasm of skin: Secondary | ICD-10-CM | POA: Diagnosis not present

## 2021-03-26 DIAGNOSIS — X32XXXA Exposure to sunlight, initial encounter: Secondary | ICD-10-CM | POA: Diagnosis not present

## 2021-05-13 DIAGNOSIS — Z9889 Other specified postprocedural states: Secondary | ICD-10-CM | POA: Diagnosis not present

## 2021-05-13 DIAGNOSIS — I214 Non-ST elevation (NSTEMI) myocardial infarction: Secondary | ICD-10-CM | POA: Diagnosis not present

## 2021-05-13 DIAGNOSIS — R079 Chest pain, unspecified: Secondary | ICD-10-CM | POA: Diagnosis not present

## 2021-05-13 DIAGNOSIS — J410 Simple chronic bronchitis: Secondary | ICD-10-CM | POA: Diagnosis not present

## 2021-05-13 DIAGNOSIS — I10 Essential (primary) hypertension: Secondary | ICD-10-CM | POA: Diagnosis not present

## 2021-05-13 DIAGNOSIS — E7800 Pure hypercholesterolemia, unspecified: Secondary | ICD-10-CM | POA: Diagnosis not present

## 2021-05-13 DIAGNOSIS — I493 Ventricular premature depolarization: Secondary | ICD-10-CM | POA: Diagnosis not present

## 2021-05-13 DIAGNOSIS — I2510 Atherosclerotic heart disease of native coronary artery without angina pectoris: Secondary | ICD-10-CM | POA: Diagnosis not present

## 2021-05-13 DIAGNOSIS — Z955 Presence of coronary angioplasty implant and graft: Secondary | ICD-10-CM | POA: Diagnosis not present

## 2021-06-08 DIAGNOSIS — Z955 Presence of coronary angioplasty implant and graft: Secondary | ICD-10-CM | POA: Diagnosis not present

## 2021-06-08 DIAGNOSIS — E039 Hypothyroidism, unspecified: Secondary | ICD-10-CM | POA: Diagnosis not present

## 2021-06-08 DIAGNOSIS — Z6834 Body mass index (BMI) 34.0-34.9, adult: Secondary | ICD-10-CM | POA: Diagnosis not present

## 2021-06-08 DIAGNOSIS — D649 Anemia, unspecified: Secondary | ICD-10-CM | POA: Diagnosis not present

## 2021-06-08 DIAGNOSIS — I2510 Atherosclerotic heart disease of native coronary artery without angina pectoris: Secondary | ICD-10-CM | POA: Diagnosis not present

## 2021-06-08 DIAGNOSIS — C6211 Malignant neoplasm of descended right testis: Secondary | ICD-10-CM | POA: Diagnosis not present

## 2021-06-08 DIAGNOSIS — E1165 Type 2 diabetes mellitus with hyperglycemia: Secondary | ICD-10-CM | POA: Diagnosis not present

## 2021-06-15 DIAGNOSIS — I10 Essential (primary) hypertension: Secondary | ICD-10-CM | POA: Diagnosis not present

## 2021-06-15 DIAGNOSIS — E039 Hypothyroidism, unspecified: Secondary | ICD-10-CM | POA: Diagnosis not present

## 2021-06-15 DIAGNOSIS — Z8547 Personal history of malignant neoplasm of testis: Secondary | ICD-10-CM | POA: Diagnosis not present

## 2021-06-15 DIAGNOSIS — J449 Chronic obstructive pulmonary disease, unspecified: Secondary | ICD-10-CM | POA: Diagnosis not present

## 2021-06-15 DIAGNOSIS — E119 Type 2 diabetes mellitus without complications: Secondary | ICD-10-CM | POA: Diagnosis not present

## 2021-06-15 DIAGNOSIS — I2510 Atherosclerotic heart disease of native coronary artery without angina pectoris: Secondary | ICD-10-CM | POA: Diagnosis not present

## 2021-06-15 DIAGNOSIS — D649 Anemia, unspecified: Secondary | ICD-10-CM | POA: Diagnosis not present

## 2021-06-15 DIAGNOSIS — Z955 Presence of coronary angioplasty implant and graft: Secondary | ICD-10-CM | POA: Diagnosis not present

## 2021-09-07 DIAGNOSIS — I493 Ventricular premature depolarization: Secondary | ICD-10-CM | POA: Diagnosis not present

## 2021-09-07 DIAGNOSIS — Z9889 Other specified postprocedural states: Secondary | ICD-10-CM | POA: Diagnosis not present

## 2021-09-07 DIAGNOSIS — Z955 Presence of coronary angioplasty implant and graft: Secondary | ICD-10-CM | POA: Diagnosis not present

## 2021-09-07 DIAGNOSIS — I10 Essential (primary) hypertension: Secondary | ICD-10-CM | POA: Diagnosis not present

## 2021-09-07 DIAGNOSIS — R079 Chest pain, unspecified: Secondary | ICD-10-CM | POA: Diagnosis not present

## 2021-09-07 DIAGNOSIS — Z23 Encounter for immunization: Secondary | ICD-10-CM | POA: Diagnosis not present

## 2021-09-07 DIAGNOSIS — I214 Non-ST elevation (NSTEMI) myocardial infarction: Secondary | ICD-10-CM | POA: Diagnosis not present

## 2021-09-07 DIAGNOSIS — R002 Palpitations: Secondary | ICD-10-CM | POA: Diagnosis not present

## 2021-09-07 DIAGNOSIS — J410 Simple chronic bronchitis: Secondary | ICD-10-CM | POA: Diagnosis not present

## 2021-10-26 DIAGNOSIS — E039 Hypothyroidism, unspecified: Secondary | ICD-10-CM | POA: Diagnosis not present

## 2021-10-26 DIAGNOSIS — E1165 Type 2 diabetes mellitus with hyperglycemia: Secondary | ICD-10-CM | POA: Diagnosis not present

## 2021-10-26 DIAGNOSIS — Z955 Presence of coronary angioplasty implant and graft: Secondary | ICD-10-CM | POA: Diagnosis not present

## 2021-10-26 DIAGNOSIS — C6211 Malignant neoplasm of descended right testis: Secondary | ICD-10-CM | POA: Diagnosis not present

## 2021-10-26 DIAGNOSIS — I10 Essential (primary) hypertension: Secondary | ICD-10-CM | POA: Diagnosis not present

## 2021-10-26 DIAGNOSIS — D649 Anemia, unspecified: Secondary | ICD-10-CM | POA: Diagnosis not present

## 2021-10-26 DIAGNOSIS — I2510 Atherosclerotic heart disease of native coronary artery without angina pectoris: Secondary | ICD-10-CM | POA: Diagnosis not present

## 2021-11-02 DIAGNOSIS — J449 Chronic obstructive pulmonary disease, unspecified: Secondary | ICD-10-CM | POA: Diagnosis not present

## 2021-11-02 DIAGNOSIS — E039 Hypothyroidism, unspecified: Secondary | ICD-10-CM | POA: Diagnosis not present

## 2021-11-02 DIAGNOSIS — Z955 Presence of coronary angioplasty implant and graft: Secondary | ICD-10-CM | POA: Diagnosis not present

## 2021-11-02 DIAGNOSIS — I10 Essential (primary) hypertension: Secondary | ICD-10-CM | POA: Diagnosis not present

## 2021-11-02 DIAGNOSIS — E119 Type 2 diabetes mellitus without complications: Secondary | ICD-10-CM | POA: Diagnosis not present

## 2021-11-02 DIAGNOSIS — I2510 Atherosclerotic heart disease of native coronary artery without angina pectoris: Secondary | ICD-10-CM | POA: Diagnosis not present

## 2021-11-02 DIAGNOSIS — M25561 Pain in right knee: Secondary | ICD-10-CM | POA: Diagnosis not present

## 2021-11-02 DIAGNOSIS — Z9181 History of falling: Secondary | ICD-10-CM | POA: Diagnosis not present

## 2021-11-02 DIAGNOSIS — Z0000 Encounter for general adult medical examination without abnormal findings: Secondary | ICD-10-CM | POA: Diagnosis not present

## 2021-12-24 DIAGNOSIS — H40003 Preglaucoma, unspecified, bilateral: Secondary | ICD-10-CM | POA: Diagnosis not present

## 2021-12-24 DIAGNOSIS — E119 Type 2 diabetes mellitus without complications: Secondary | ICD-10-CM | POA: Diagnosis not present

## 2021-12-24 DIAGNOSIS — H524 Presbyopia: Secondary | ICD-10-CM | POA: Diagnosis not present

## 2022-01-07 DIAGNOSIS — R0602 Shortness of breath: Secondary | ICD-10-CM | POA: Diagnosis not present

## 2022-01-07 DIAGNOSIS — I2510 Atherosclerotic heart disease of native coronary artery without angina pectoris: Secondary | ICD-10-CM | POA: Diagnosis not present

## 2022-01-07 DIAGNOSIS — I10 Essential (primary) hypertension: Secondary | ICD-10-CM | POA: Diagnosis not present

## 2022-01-07 DIAGNOSIS — I493 Ventricular premature depolarization: Secondary | ICD-10-CM | POA: Diagnosis not present

## 2022-01-07 DIAGNOSIS — Z955 Presence of coronary angioplasty implant and graft: Secondary | ICD-10-CM | POA: Diagnosis not present

## 2022-01-07 DIAGNOSIS — Z9889 Other specified postprocedural states: Secondary | ICD-10-CM | POA: Diagnosis not present

## 2022-01-07 DIAGNOSIS — J410 Simple chronic bronchitis: Secondary | ICD-10-CM | POA: Diagnosis not present

## 2022-01-07 DIAGNOSIS — I214 Non-ST elevation (NSTEMI) myocardial infarction: Secondary | ICD-10-CM | POA: Diagnosis not present

## 2022-01-07 DIAGNOSIS — Z23 Encounter for immunization: Secondary | ICD-10-CM | POA: Diagnosis not present

## 2022-01-14 DIAGNOSIS — H2512 Age-related nuclear cataract, left eye: Secondary | ICD-10-CM | POA: Diagnosis not present

## 2022-01-21 ENCOUNTER — Encounter: Payer: Self-pay | Admitting: Ophthalmology

## 2022-01-26 NOTE — Discharge Instructions (Signed)

## 2022-01-27 ENCOUNTER — Encounter
Admission: RE | Disposition: A | Discharge status: Home / Self Care | Payer: Self-pay | Source: Home / Self Care | Attending: Ophthalmology

## 2022-01-27 ENCOUNTER — Other Ambulatory Visit: Payer: Self-pay

## 2022-01-27 ENCOUNTER — Ambulatory Visit
Admission: RE | Admit: 2022-01-27 | Discharge: 2022-01-27 | Disposition: A | Discharge status: Home / Self Care | Payer: Medicare HMO | Attending: Ophthalmology | Admitting: Ophthalmology

## 2022-01-27 ENCOUNTER — Encounter: Payer: Self-pay | Admitting: Ophthalmology

## 2022-01-27 ENCOUNTER — Ambulatory Visit: Payer: Medicare HMO | Admitting: Anesthesiology

## 2022-01-27 DIAGNOSIS — I2510 Atherosclerotic heart disease of native coronary artery without angina pectoris: Secondary | ICD-10-CM | POA: Diagnosis not present

## 2022-01-27 DIAGNOSIS — Z955 Presence of coronary angioplasty implant and graft: Secondary | ICD-10-CM | POA: Insufficient documentation

## 2022-01-27 DIAGNOSIS — E039 Hypothyroidism, unspecified: Secondary | ICD-10-CM | POA: Diagnosis not present

## 2022-01-27 DIAGNOSIS — H25812 Combined forms of age-related cataract, left eye: Secondary | ICD-10-CM | POA: Diagnosis not present

## 2022-01-27 DIAGNOSIS — I252 Old myocardial infarction: Secondary | ICD-10-CM | POA: Diagnosis not present

## 2022-01-27 DIAGNOSIS — J449 Chronic obstructive pulmonary disease, unspecified: Secondary | ICD-10-CM | POA: Insufficient documentation

## 2022-01-27 DIAGNOSIS — H2512 Age-related nuclear cataract, left eye: Secondary | ICD-10-CM | POA: Insufficient documentation

## 2022-01-27 DIAGNOSIS — I10 Essential (primary) hypertension: Secondary | ICD-10-CM | POA: Insufficient documentation

## 2022-01-27 DIAGNOSIS — Z87891 Personal history of nicotine dependence: Secondary | ICD-10-CM | POA: Diagnosis not present

## 2022-01-27 DIAGNOSIS — Z6833 Body mass index (BMI) 33.0-33.9, adult: Secondary | ICD-10-CM | POA: Insufficient documentation

## 2022-01-27 DIAGNOSIS — E1136 Type 2 diabetes mellitus with diabetic cataract: Secondary | ICD-10-CM | POA: Diagnosis not present

## 2022-01-27 HISTORY — PX: CATARACT EXTRACTION W/PHACO: SHX586

## 2022-01-27 HISTORY — DX: Hypothyroidism, unspecified: E03.9

## 2022-01-27 LAB — GLUCOSE, CAPILLARY
Glucose-Capillary: 170 mg/dL — ABNORMAL HIGH (ref 70–99)
Glucose-Capillary: 177 mg/dL — ABNORMAL HIGH (ref 70–99)

## 2022-01-27 SURGERY — PHACOEMULSIFICATION, CATARACT, WITH IOL INSERTION
Anesthesia: Monitor Anesthesia Care | Site: Eye | Laterality: Left

## 2022-01-27 MED ORDER — LACTATED RINGERS IV SOLN: INTRAVENOUS | Status: DC

## 2022-01-27 MED ORDER — OXYCODONE HCL 5 MG/5ML PO SOLN: 5.0000 mg | Freq: Once | ORAL | Status: DC | PRN

## 2022-01-27 MED ORDER — BRIMONIDINE TARTRATE-TIMOLOL 0.2-0.5 % OP SOLN
OPHTHALMIC | Status: DC | PRN
  Administered 2022-01-27: 1 [drp] via OPHTHALMIC

## 2022-01-27 MED ORDER — NEOMYCIN-POLYMYXIN-DEXAMETH 3.5-10000-0.1 OP OINT
TOPICAL_OINTMENT | OPHTHALMIC | Status: DC | PRN
  Administered 2022-01-27: 1 via OPHTHALMIC

## 2022-01-27 MED ORDER — OXYCODONE HCL 5 MG PO TABS: 5.0000 mg | ORAL_TABLET | Freq: Once | ORAL | Status: DC | PRN

## 2022-01-27 MED ORDER — ARMC OPHTHALMIC DILATING DROPS
1.0000 "application " | OPHTHALMIC | Status: DC | PRN
  Administered 2022-01-27 (×3): 1 via OPHTHALMIC

## 2022-01-27 MED ORDER — TETRACAINE HCL 0.5 % OP SOLN
1.0000 [drp] | OPHTHALMIC | Status: DC | PRN
  Administered 2022-01-27 (×3): 1 [drp] via OPHTHALMIC

## 2022-01-27 MED ORDER — FENTANYL CITRATE (PF) 100 MCG/2ML IJ SOLN
INTRAMUSCULAR | Status: DC | PRN
  Administered 2022-01-27: 50 ug via INTRAVENOUS

## 2022-01-27 MED ORDER — MIDAZOLAM HCL 2 MG/2ML IJ SOLN
INTRAMUSCULAR | Status: DC | PRN
  Administered 2022-01-27 (×2): 1 mg via INTRAVENOUS

## 2022-01-27 MED ORDER — SIGHTPATH DOSE#1 BSS IO SOLN
INTRAOCULAR | Status: DC | PRN
  Administered 2022-01-27: 15 mL via INTRAOCULAR

## 2022-01-27 MED ORDER — SIGHTPATH DOSE#1 NA HYALUR & NA CHOND-NA HYALUR IO KIT
PACK | INTRAOCULAR | Status: DC | PRN
  Administered 2022-01-27: 1 via OPHTHALMIC

## 2022-01-27 MED ORDER — SIGHTPATH DOSE#1 BSS IO SOLN
INTRAOCULAR | Status: DC | PRN
  Administered 2022-01-27: 110 mL via OPHTHALMIC

## 2022-01-27 MED ORDER — CEFUROXIME OPHTHALMIC INJECTION 1 MG/0.1 ML
INJECTION | OPHTHALMIC | Status: DC | PRN
  Administered 2022-01-27: 0.1 mL via INTRACAMERAL

## 2022-01-27 MED ORDER — SIGHTPATH DOSE#1 BSS IO SOLN
INTRAOCULAR | Status: DC | PRN
  Administered 2022-01-27: 2 mL

## 2022-01-27 SURGICAL SUPPLY — 11 items
CATARACT SUITE SIGHTPATH (MISCELLANEOUS) ×2 IMPLANT
FEE CATARACT SUITE SIGHTPATH (MISCELLANEOUS) ×1 IMPLANT
GLOVE SRG 8 PF TXTR STRL LF DI (GLOVE) ×1 IMPLANT
GLOVE SURG ENC TEXT LTX SZ7.5 (GLOVE) ×2 IMPLANT
GLOVE SURG UNDER POLY LF SZ8 (GLOVE) ×2
LENS IOL TECNIS EYHANCE 19.5 (Intraocular Lens) ×1 IMPLANT
NDL FILTER BLUNT 18X1 1/2 (NEEDLE) ×1 IMPLANT
NEEDLE FILTER BLUNT 18X 1/2SAF (NEEDLE) ×1
NEEDLE FILTER BLUNT 18X1 1/2 (NEEDLE) ×1 IMPLANT
SYR 3ML LL SCALE MARK (SYRINGE) ×2 IMPLANT
WATER STERILE IRR 250ML POUR (IV SOLUTION) ×2 IMPLANT

## 2022-01-27 NOTE — Anesthesia Postprocedure Evaluation (Signed)
Anesthesia Post Note ? ?Patient: Jason Mclean. ? ?Procedure(s) Performed: CATARACT EXTRACTION PHACO AND INTRAOCULAR LENS PLACEMENT (IOC) LEFT DIABETIC 6.17 01:17.1 (Left: Eye) ? ? ?  ?Patient location during evaluation: PACU ?Anesthesia Type: MAC ?Level of consciousness: awake and alert ?Pain management: pain level controlled ?Vital Signs Assessment: post-procedure vital signs reviewed and stable ?Respiratory status: spontaneous breathing, nonlabored ventilation, respiratory function stable and patient connected to nasal cannula oxygen ?Cardiovascular status: stable and blood pressure returned to baseline ?Postop Assessment: no apparent nausea or vomiting ?Anesthetic complications: no ? ? ?No notable events documented. ? ?Fidel Levy ? ? ? ? ? ?

## 2022-01-27 NOTE — H&P (Signed)
?[facility]  ? ?Primary Care Physician:  Tracie Harrier, MD ?Ophthalmologist: Dr. Leandrew Koyanagi ? ?Pre-Procedure History & Physical: ?HPI:  Jason Norem. is a 76 y.o. male here for ophthalmic surgery. ?  ?Past Medical History:  ?Diagnosis Date  ? Coronary artery disease   ? s/p three coronary stents  ? Diabetes mellitus without complication ([place])   ? Hypercholesterolemia   ? Hypertension   ? Hypothyroidism   ? ? ?Past Surgical History:  ?Procedure Laterality Date  ? CORONARY ANGIOPLASTY WITH STENT PLACEMENT    ? CORONARY BALLOON ANGIOPLASTY N/A 01/29/2020  ? Procedure: CORONARY STENT INTERVENTION;  Surgeon: Isaias Cowman, MD;  Location: [place] [facility];  Service: Cardiovascular;  Laterality: N/A;  ? CORONARY STENT INTERVENTION N/A 06/16/2018  ? Procedure: CORONARY STENT INTERVENTION;  Surgeon: Isaias Cowman, MD;  Location: [place] [facility];  Service: Cardiovascular;  Laterality: N/A;  ? LEFT HEART CATH AND CORONARY ANGIOGRAPHY N/A 06/16/2018  ? Procedure: LEFT HEART CATH AND CORONARY ANGIOGRAPHY;  Surgeon: Isaias Cowman, MD;  Location: [place] [facility];  Service: Cardiovascular;  Laterality: N/A;  ? LEFT HEART CATH AND CORONARY ANGIOGRAPHY Left 01/29/2020  ? Procedure: LEFT HEART CATH AND CORONARY ANGIOGRAPHY;  Surgeon: Isaias Cowman, MD;  Location: [place] [facility];  Service: Cardiovascular;  Laterality: Left;  ? ? ?Prior to Admission medications   ?Medication Sig Start Date End Date Taking? Authorizing Provider  ?amLODipine (NORVASC) 2.5 MG tablet Take 2.5 mg by mouth daily.   Yes [provider]  ?aspirin EC 81 MG tablet Take 81 mg by mouth daily.    Yes [provider]  ?atorvastatin (LIPITOR) 80 MG tablet Take 1 tablet (80 mg total) by mouth daily at 6 PM. 06/17/18  Yes Dustin Flock, MD  ?Cholecalciferol (VITAMIN D3) 50 MCG (2000 UT) TABS Take 2,000 Units by mouth daily.   Yes [provider]  ?clopidogrel (PLAVIX) 75 MG  tablet Take 1 tablet (75 mg total) by mouth daily with breakfast. 01/30/20  Yes Paraschos, Alexander, MD  ?Coenzyme Q10 100 MG TABS Take 100 mg by mouth every other day.    Yes [provider]  ?glimepiride (AMARYL) 2 MG tablet Take 2 mg by mouth daily. 12/27/19  Yes [provider]  ?isosorbide mononitrate (IMDUR) 30 MG 24 hr tablet Take 1 tablet (30 mg total) by mouth daily. 06/17/18  Yes Dustin Flock, MD  ?levothyroxine (SYNTHROID, LEVOTHROID) 75 MCG tablet Take 75 mcg by mouth daily before breakfast.  03/28/18  Yes [provider]  ?losartan (COZAAR) 50 MG tablet Take 50 mg by mouth daily. 05/29/18  Yes [provider]  ?metFORMIN (GLUCOPHAGE) 1000 MG tablet Take 1,000 mg by mouth 2 (two) times daily with a meal. 05/16/18  Yes [provider]  ?metoprolol succinate (TOPROL-XL) 50 MG 24 hr tablet Take 50 mg by mouth daily. 05/16/18  Yes [provider]  ?Multiple Vitamin (MULTIVITAMIN WITH MINERALS) TABS tablet Take 1 tablet by mouth daily. Centrum   Yes [provider]  ?TURMERIC PO Take by mouth.   Yes [provider]  ?colchicine 0.6 MG tablet Take 1 tablet (0.6 mg total) by mouth 2 (two) times daily for 7 days. 06/17/18 06/24/18  Dustin Flock, MD  ?Omega-3 Fatty Acids (SALMON OIL-1000 PO) Take 1,000 mg by mouth daily. ?Patient not taking: Reported on 01/21/2022    [provider]  ? ? ?Allergies as of 12/29/2021  ? (No Known Allergies)  ? ? ?Family History  ?  Problem Relation Age of Onset  ? Strabismus Mother   ? Stroke Mother   ? CAD Father   ? ? ?Social History  ? ?Socioeconomic History  ? Marital status: Divorced  ?  Spouse name: Not on file  ? Number of children: Not on file  ? Years of education: Not on file  ? Highest education level: Not on file  ?Occupational History  ? Not on file  ?Tobacco Use  ? Smoking status: Former  ?  Types: Cigarettes  ?  Quit date: 2014  ?  Years since quitting: 9.1  ? Smokeless tobacco: Never  ?Vaping Use   ? Vaping Use: Never used  ?Substance and Sexual Activity  ? Alcohol use: Not Currently  ? Drug use: Never  ? Sexual activity: Not on file  ?Other Topics Concern  ? Not on file  ?Social History Narrative  ? Not on file  ? ?Social Determinants of Health  ? ?Financial Resource Strain: Not on file  ?Food Insecurity: Not on file  ?Transportation Needs: Not on file  ?Physical Activity: Not on file  ?Stress: Not on file  ?Social Connections: Not on file  ?Intimate Partner Violence: Not on file  ? ? ?Review of Systems: ?See HPI, otherwise negative ROS ? ?Physical Exam: ?BP (!) 178/72   Pulse 66   Temp (!) 97.2 ?F (36.2 ?C) (Temporal)   Resp (!) 22   Ht '5\' 9"'$  (1.753 m)   Wt 102.5 kg   SpO2 97%   BMI 33.37 kg/m?  ?General:   Alert,  pleasant and cooperative in NAD ?Head:  Normocephalic and atraumatic. ?Lungs:  Clear to auscultation.    ?Heart:  Regular rate and rhythm.  ? ?Impression/Plan: ?Jason Cree. is here for ophthalmic surgery. ? ?Risks, benefits, limitations, and alternatives regarding ophthalmic surgery have been reviewed with the patient.  Questions have been answered.  All parties agreeable. ? ? Leandrew Koyanagi, MD  01/27/2022, 10:03 AM ? ? ?

## 2022-01-27 NOTE — Transfer of Care (Signed)
Immediate Anesthesia Transfer of Care Note ? ?Patient: Jason Mclean. ? ?Procedure(s) Performed: CATARACT EXTRACTION PHACO AND INTRAOCULAR LENS PLACEMENT (IOC) LEFT DIABETIC 6.17 01:17.1 (Left: Eye) ? ?Patient Location: PACU ? ?Anesthesia Type: MAC ? ?Level of Consciousness: awake, alert  and patient cooperative ? ?Airway and Oxygen Therapy: Patient Spontanous Breathing and Patient connected to supplemental oxygen ? ?Post-op Assessment: Post-op Vital signs reviewed, Patient's Cardiovascular Status Stable, Respiratory Function Stable, Patent Airway and No signs of Nausea or vomiting ? ?Post-op Vital Signs: Reviewed and stable ? ?Complications: No notable events documented. ? ?

## 2022-01-27 NOTE — Anesthesia Procedure Notes (Signed)
Procedure Name: [place] ?Date/Time: 01/27/2022 11:20 AM ?Performed by: Cameron Ali, CRNA ?Pre-anesthesia Checklist: Patient identified, Emergency Drugs available, Suction available, Timeout performed and Patient being monitored ?Patient Re-evaluated:Patient Re-evaluated prior to induction ?Oxygen Delivery Method: Nasal cannula ?Placement Confirmation: positive ETCO2 ? ? ? ? ?

## 2022-01-27 NOTE — Anesthesia Preprocedure Evaluation (Signed)
Anesthesia Evaluation  ?Patient identified by MRN, date of birth, ID band ?Patient awake ? ? ? ?Reviewed: ?NPO status  ? ?History of Anesthesia Complications ?Negative for: history of anesthetic complications ? ?Airway ?Mallampati: II ? ?TM Distance: >3 FB ?Neck ROM: full ? ? ? Dental ?no notable dental hx. ? ?  ?Pulmonary ?COPD (mild), former smoker,  ?  ?Pulmonary exam normal ? ? ? ? ? ? ? Cardiovascular ?Exercise Tolerance: Good ?hypertension, (-) angina+ CAD, + Past MI (2020) and + Cardiac Stents (x6)  ?Normal cardiovascular exam ? ? ?  ?Neuro/Psych ?negative neurological ROS ? negative psych ROS  ? GI/Hepatic ?negative GI ROS, Neg liver ROS,   ?Endo/Other  ?diabetesHypothyroidism Morbid obesity (bmi 33) ? Renal/GU ?negative Renal ROS  ?negative genitourinary ?  ?Musculoskeletal ? ? Abdominal ?  ?Peds ? Hematology ?Testicular cancer 2001   ?Anesthesia Other Findings ?cards: feb 2023 : paraschos; ? ?Last plavix: 3/7 ? Reproductive/Obstetrics ? ?  ? ? ? ? ? ? ? ? ? ? ? ? ? ?  ?  ? ? ? ? ? ? ? ? ?Anesthesia Physical ?Anesthesia Plan ? ?ASA: 3 ? ?Anesthesia Plan: MAC  ? ?Post-op Pain Management:   ? ?Induction:  ? ?PONV Risk Score and Plan: 1 and Midazolam ? ?Airway Management Planned:  ? ?Additional Equipment:  ? ?Intra-op Plan:  ? ?Post-operative Plan:  ? ?Informed Consent: I have reviewed the patients History and Physical, chart, labs and discussed the procedure including the risks, benefits and alternatives for the proposed anesthesia with the patient or authorized representative who has indicated his/her understanding and acceptance.  ? ? ? ? ? ?Plan Discussed with: CRNA ? ?Anesthesia Plan Comments:   ? ? ? ? ? ? ?Anesthesia Quick Evaluation ? ?

## 2022-01-27 NOTE — Op Note (Signed)
OPERATIVE NOTE ? ?[place] ?[id number] ?01/27/2022 ? ? ?PREOPERATIVE DIAGNOSIS:  Nuclear sclerotic cataract left eye. H25.12 ?  ?POSTOPERATIVE DIAGNOSIS:    Nuclear sclerotic cataract left eye.   ?  ?PROCEDURE:  Phacoemusification with posterior chamber intraocular lens placement of the left eye  ?Ultrasound time: Procedure(s) with comments: ?CATARACT EXTRACTION PHACO AND INTRAOCULAR LENS PLACEMENT (IOC) LEFT DIABETIC 6.17 01:17.1 (Left) - Diabetic ? ?LENS:   ?Implant Name Type Inv. Item Serial No. Manufacturer Lot No. LRB No. Used Action  ?LENS IOL TECNIS EYHANCE 19.5 - [id number] Intraocular Lens LENS IOL TECNIS EYHANCE 19.5 [id number] [employer]  Left 1 Implanted  ?   ? ?SURGEON:  Wyonia Hough, MD ?  ?ANESTHESIA:  Topical with tetracaine drops and 2% Xylocaine jelly, augmented with 1% preservative-free intracameral lidocaine. ? ?  ?COMPLICATIONS:  None. ?  ?DESCRIPTION OF PROCEDURE:  The patient was identified in the holding room and transported to the operating room and placed in the supine position under the operating microscope.  The left eye was identified as the operative eye and it was prepped and draped in the usual sterile ophthalmic fashion. ?  ?A 1 millimeter clear-corneal paracentesis was made at the 1:30 position.  0.5 ml of preservative-free 1% lidocaine was injected into the anterior chamber. ? The anterior chamber was filled with Viscoat viscoelastic.  A 2.4 millimeter keratome was used to make a near-clear corneal incision at the 10:30 position.  .  A curvilinear capsulorrhexis was made with a cystotome and capsulorrhexis forceps.  Balanced salt solution was used to hydrodissect and hydrodelineate the nucleus. ?  ?Phacoemulsification was then used in stop and chop fashion to remove the lens nucleus and epinucleus.  The remaining cortex was then removed using the irrigation and aspiration handpiece. Provisc was then placed into the capsular bag to distend it for lens placement.  A  lens was then injected into the capsular bag.  The remaining viscoelastic was aspirated. ?  ?Wounds were hydrated with balanced salt solution.  The anterior chamber was inflated to a physiologic pressure with balanced salt solution.  No wound leaks were noted. Cefuroxime 0.1 ml of a '10mg'$ /ml solution was injected into the anterior chamber for a dose of 1 mg of intracameral antibiotic at the completion of the case. ?  Timolol and Brimonidine drops and Maxitrol ointment were applied to the eye.  The patient was taken to the recovery room in stable condition without complications of anesthesia or surgery. ? ?Jason Mclean ?01/27/2022, 11:33 AM ? ?

## 2022-01-28 ENCOUNTER — Encounter: Payer: Self-pay | Admitting: Ophthalmology

## 2022-01-28 ENCOUNTER — Other Ambulatory Visit: Payer: Self-pay

## 2022-02-02 DIAGNOSIS — H2511 Age-related nuclear cataract, right eye: Secondary | ICD-10-CM | POA: Diagnosis not present

## 2022-02-08 NOTE — Discharge Instructions (Signed)

## 2022-02-10 ENCOUNTER — Ambulatory Visit
Admission: RE | Admit: 2022-02-10 | Discharge: 2022-02-10 | Disposition: A | Discharge status: Home / Self Care | Payer: Medicare HMO | Attending: Ophthalmology | Admitting: Ophthalmology

## 2022-02-10 ENCOUNTER — Other Ambulatory Visit: Payer: Self-pay

## 2022-02-10 ENCOUNTER — Ambulatory Visit: Payer: Medicare HMO | Admitting: Anesthesiology

## 2022-02-10 ENCOUNTER — Encounter
Admission: RE | Disposition: A | Discharge status: Home / Self Care | Payer: Self-pay | Source: Home / Self Care | Attending: Ophthalmology

## 2022-02-10 ENCOUNTER — Encounter: Payer: Self-pay | Admitting: Ophthalmology

## 2022-02-10 DIAGNOSIS — H25811 Combined forms of age-related cataract, right eye: Secondary | ICD-10-CM | POA: Diagnosis not present

## 2022-02-10 DIAGNOSIS — I252 Old myocardial infarction: Secondary | ICD-10-CM | POA: Diagnosis not present

## 2022-02-10 DIAGNOSIS — Z7984 Long term (current) use of oral hypoglycemic drugs: Secondary | ICD-10-CM | POA: Insufficient documentation

## 2022-02-10 DIAGNOSIS — E1136 Type 2 diabetes mellitus with diabetic cataract: Secondary | ICD-10-CM | POA: Diagnosis not present

## 2022-02-10 DIAGNOSIS — Z87891 Personal history of nicotine dependence: Secondary | ICD-10-CM | POA: Diagnosis not present

## 2022-02-10 DIAGNOSIS — J449 Chronic obstructive pulmonary disease, unspecified: Secondary | ICD-10-CM | POA: Diagnosis not present

## 2022-02-10 DIAGNOSIS — Z6834 Body mass index (BMI) 34.0-34.9, adult: Secondary | ICD-10-CM | POA: Diagnosis not present

## 2022-02-10 DIAGNOSIS — H2511 Age-related nuclear cataract, right eye: Secondary | ICD-10-CM | POA: Diagnosis not present

## 2022-02-10 DIAGNOSIS — I2510 Atherosclerotic heart disease of native coronary artery without angina pectoris: Secondary | ICD-10-CM | POA: Diagnosis not present

## 2022-02-10 DIAGNOSIS — I10 Essential (primary) hypertension: Secondary | ICD-10-CM | POA: Insufficient documentation

## 2022-02-10 DIAGNOSIS — Z79899 Other long term (current) drug therapy: Secondary | ICD-10-CM | POA: Insufficient documentation

## 2022-02-10 DIAGNOSIS — Z7902 Long term (current) use of antithrombotics/antiplatelets: Secondary | ICD-10-CM | POA: Insufficient documentation

## 2022-02-10 DIAGNOSIS — Z955 Presence of coronary angioplasty implant and graft: Secondary | ICD-10-CM | POA: Diagnosis not present

## 2022-02-10 DIAGNOSIS — E039 Hypothyroidism, unspecified: Secondary | ICD-10-CM | POA: Diagnosis not present

## 2022-02-10 HISTORY — PX: CATARACT EXTRACTION W/PHACO: SHX586

## 2022-02-10 LAB — GLUCOSE, CAPILLARY
Glucose-Capillary: 154 mg/dL — ABNORMAL HIGH (ref 70–99)
Glucose-Capillary: 154 mg/dL — ABNORMAL HIGH (ref 70–99)

## 2022-02-10 SURGERY — PHACOEMULSIFICATION, CATARACT, WITH IOL INSERTION
Anesthesia: Monitor Anesthesia Care | Site: Eye | Laterality: Right

## 2022-02-10 MED ORDER — LABETALOL HCL 5 MG/ML IV SOLN
INTRAVENOUS | Status: DC | PRN
  Administered 2022-02-10: 5 mg via INTRAVENOUS

## 2022-02-10 MED ORDER — SIGHTPATH DOSE#1 BSS IO SOLN
INTRAOCULAR | Status: DC | PRN
  Administered 2022-02-10: 15 mL

## 2022-02-10 MED ORDER — FENTANYL CITRATE (PF) 100 MCG/2ML IJ SOLN
INTRAMUSCULAR | Status: DC | PRN
  Administered 2022-02-10 (×2): 50 ug via INTRAVENOUS

## 2022-02-10 MED ORDER — ARMC OPHTHALMIC DILATING DROPS
1.0000 "application " | OPHTHALMIC | Status: DC | PRN
  Administered 2022-02-10 (×3): 1 via OPHTHALMIC

## 2022-02-10 MED ORDER — OXYCODONE HCL 5 MG/5ML PO SOLN: 5.0000 mg | Freq: Once | ORAL | Status: DC | PRN

## 2022-02-10 MED ORDER — SIGHTPATH DOSE#1 BSS IO SOLN
INTRAOCULAR | Status: DC | PRN
  Administered 2022-02-10: 100 mL via OPHTHALMIC

## 2022-02-10 MED ORDER — SIGHTPATH DOSE#1 NA HYALUR & NA CHOND-NA HYALUR IO KIT
PACK | INTRAOCULAR | Status: DC | PRN
  Administered 2022-02-10: 1 via OPHTHALMIC

## 2022-02-10 MED ORDER — OXYCODONE HCL 5 MG PO TABS: 5.0000 mg | ORAL_TABLET | Freq: Once | ORAL | Status: DC | PRN

## 2022-02-10 MED ORDER — MIDAZOLAM HCL 2 MG/2ML IJ SOLN
INTRAMUSCULAR | Status: DC | PRN
  Administered 2022-02-10 (×2): 1 mg via INTRAVENOUS

## 2022-02-10 MED ORDER — LACTATED RINGERS IV SOLN: INTRAVENOUS | Status: DC

## 2022-02-10 MED ORDER — TETRACAINE HCL 0.5 % OP SOLN
1.0000 [drp] | OPHTHALMIC | Status: DC | PRN
  Administered 2022-02-10 (×3): 1 [drp] via OPHTHALMIC

## 2022-02-10 MED ORDER — SIGHTPATH DOSE#1 BSS IO SOLN
INTRAOCULAR | Status: DC | PRN
  Administered 2022-02-10: 1 mL via INTRAMUSCULAR

## 2022-02-10 MED ORDER — CEFUROXIME OPHTHALMIC INJECTION 1 MG/0.1 ML
INJECTION | OPHTHALMIC | Status: DC | PRN
  Administered 2022-02-10: 0.1 mL via INTRACAMERAL

## 2022-02-10 MED ORDER — BRIMONIDINE TARTRATE-TIMOLOL 0.2-0.5 % OP SOLN
OPHTHALMIC | Status: DC | PRN
  Administered 2022-02-10: 1 [drp] via OPHTHALMIC

## 2022-02-10 SURGICAL SUPPLY — 11 items
CATARACT SUITE SIGHTPATH (MISCELLANEOUS) ×2 IMPLANT
FEE CATARACT SUITE SIGHTPATH (MISCELLANEOUS) ×1 IMPLANT
GLOVE SRG 8 PF TXTR STRL LF DI (GLOVE) ×1 IMPLANT
GLOVE SURG ENC TEXT LTX SZ7.5 (GLOVE) ×2 IMPLANT
GLOVE SURG UNDER POLY LF SZ8 (GLOVE) ×2
LENS IOL TECNIS EYHANCE 20.0 (Intraocular Lens) ×1 IMPLANT
NDL FILTER BLUNT 18X1 1/2 (NEEDLE) ×1 IMPLANT
NEEDLE FILTER BLUNT 18X 1/2SAF (NEEDLE) ×1
NEEDLE FILTER BLUNT 18X1 1/2 (NEEDLE) ×1 IMPLANT
SYR 3ML LL SCALE MARK (SYRINGE) ×2 IMPLANT
WATER STERILE IRR 250ML POUR (IV SOLUTION) ×2 IMPLANT

## 2022-02-10 NOTE — Op Note (Signed)
LOCATION:  [place] ?  ?PREOPERATIVE DIAGNOSIS:    Nuclear sclerotic cataract right eye. H25.11 ?  ?POSTOPERATIVE DIAGNOSIS:  Nuclear sclerotic cataract right eye.   ?  ?PROCEDURE:  Phacoemusification with posterior chamber intraocular lens placement of the right eye  ? ?ULTRASOUND TIME: Procedure(s) with comments: ?CATARACT EXTRACTION PHACO AND INTRAOCULAR LENS PLACEMENT (IOC) RIGHT DIABETIC 10.48 01:19.8 (Right) - Diabetic ? ?LENS:   ?Implant Name Type Inv. Item Serial No. Manufacturer Lot No. LRB No. Used Action  ?LENS IOL TECNIS EYHANCE 20.0 - [id number] Intraocular Lens LENS IOL TECNIS EYHANCE 20.0 [id number] [employer]  Right 1 Implanted  ?   ?  ?  ?SURGEON:  Wyonia Hough, MD ?  ?ANESTHESIA:  Topical with tetracaine drops and 2% Xylocaine jelly, augmented with 1% preservative-free intracameral lidocaine. ? ?  ?COMPLICATIONS:  None. ?  ?DESCRIPTION OF PROCEDURE:  The patient was identified in the holding room and transported to the operating room and placed in the supine position under the operating microscope.  The right eye was identified as the operative eye and it was prepped and draped in the usual sterile ophthalmic fashion. ?  ?A 1 millimeter clear-corneal paracentesis was made at the 12:00 position.  0.5 ml of preservative-free 1% lidocaine was injected into the anterior chamber. ?The anterior chamber was filled with Viscoat viscoelastic.  A 2.4 millimeter keratome was used to make a near-clear corneal incision at the 9:00 position.  A curvilinear capsulorrhexis was made with a cystotome and capsulorrhexis forceps.  Balanced salt solution was used to hydrodissect and hydrodelineate the nucleus. ?  ?Phacoemulsification was then used in stop and chop fashion to remove the lens nucleus and epinucleus.  The remaining cortex was then removed using the irrigation and aspiration handpiece. Provisc was then placed into the capsular bag to distend it for lens placement.  A lens was then  injected into the capsular bag.  The remaining viscoelastic was aspirated. ?  ?Wounds were hydrated with balanced salt solution.  The anterior chamber was inflated to a physiologic pressure with balanced salt solution.  No wound leaks were noted. Cefuroxime 0.1 ml of a '10mg'$ /ml solution was injected into the anterior chamber for a dose of 1 mg of intracameral antibiotic at the completion of the case. ?  Timolol and Brimonidine drops were applied to the eye.  The patient was taken to the recovery room in stable condition without complications of anesthesia or surgery. ? ? ?Dacy Enrico ?02/10/2022, 8:08 AM ? ?

## 2022-02-10 NOTE — Transfer of Care (Signed)
Immediate Anesthesia Transfer of Care Note ? ?Patient: Jason Mclean. ? ?Procedure(s) Performed: CATARACT EXTRACTION PHACO AND INTRAOCULAR LENS PLACEMENT (IOC) RIGHT DIABETIC 10.48 01:19.8 (Right: Eye) ? ?Patient Location: PACU ? ?Anesthesia Type: MAC ? ?Level of Consciousness: awake, alert  and patient cooperative ? ?Airway and Oxygen Therapy: Patient Spontanous Breathing and Patient connected to supplemental oxygen ? ?Post-op Assessment: Post-op Vital signs reviewed, Patient's Cardiovascular Status Stable, Respiratory Function Stable, Patent Airway and No signs of Nausea or vomiting ? ?Post-op Vital Signs: Reviewed and stable ? ?Complications: No notable events documented. ? ?

## 2022-02-10 NOTE — H&P (Signed)
[place]  ? ?Primary Care Physician:  Tracie Harrier, MD ?Ophthalmologist: Dr. Leandrew Koyanagi ? ?Pre-Procedure History & Physical: ?HPI:  Jason Mclean. is a 76 y.o. male here for ophthalmic surgery. ?  ?Past Medical History:  ?Diagnosis Date  ? Coronary artery disease   ? s/p three coronary stents  ? Diabetes mellitus without complication ([place])   ? Hypercholesterolemia   ? Hypertension   ? Hypothyroidism   ? ? ?Past Surgical History:  ?Procedure Laterality Date  ? CATARACT EXTRACTION W/PHACO Left 01/27/2022  ? Procedure: CATARACT EXTRACTION PHACO AND INTRAOCULAR LENS PLACEMENT ([place]) LEFT DIABETIC 6.17 01:17.1;  Surgeon: Leandrew Koyanagi, MD;  Location: [facility];  Service: Ophthalmology;  Laterality: Left;  Diabetic  ? CORONARY ANGIOPLASTY WITH STENT PLACEMENT    ? CORONARY BALLOON ANGIOPLASTY N/A 01/29/2020  ? Procedure: CORONARY STENT INTERVENTION;  Surgeon: Isaias Cowman, MD;  Location: [place];  Service: Cardiovascular;  Laterality: N/A;  ? CORONARY STENT INTERVENTION N/A 06/16/2018  ? Procedure: CORONARY STENT INTERVENTION;  Surgeon: Isaias Cowman, MD;  Location: [facility];  Service: Cardiovascular;  Laterality: N/A;  ? LEFT HEART CATH AND CORONARY ANGIOGRAPHY N/A 06/16/2018  ? Procedure: LEFT HEART CATH AND CORONARY ANGIOGRAPHY;  Surgeon: Isaias Cowman, MD;  Location: [facility];  Service: Cardiovascular;  Laterality: N/A;  ? LEFT HEART CATH AND CORONARY ANGIOGRAPHY Left 01/29/2020  ? Procedure: LEFT HEART CATH AND CORONARY ANGIOGRAPHY;  Surgeon: Isaias Cowman, MD;  Location: [facility];  Service: Cardiovascular;  Laterality: Left;  ? ? ?Prior to Admission medications   ?Medication Sig Start Date End Date Taking? Authorizing Provider  ?amLODipine (NORVASC) 2.5 MG tablet Take 2.5 mg by mouth daily.   Yes [provider]  ?aspirin EC 81 MG tablet Take 81 mg by mouth daily.    Yes [provider]   ?atorvastatin (LIPITOR) 80 MG tablet Take 1 tablet (80 mg total) by mouth daily at 6 PM. 06/17/18  Yes Dustin Flock, MD  ?Cholecalciferol (VITAMIN D3) 50 MCG (2000 UT) TABS Take 2,000 Units by mouth daily.   Yes [provider]  ?clopidogrel (PLAVIX) 75 MG tablet Take 1 tablet (75 mg total) by mouth daily with breakfast. 01/30/20  Yes Paraschos, Alexander, MD  ?Coenzyme Q10 100 MG TABS Take 100 mg by mouth every other day.    Yes [provider]  ?colchicine 0.6 MG tablet Take 1 tablet (0.6 mg total) by mouth 2 (two) times daily for 7 days. 06/17/18 02/10/22 Yes Dustin Flock, MD  ?glimepiride (AMARYL) 2 MG tablet Take 2 mg by mouth daily. 12/27/19  Yes [provider]  ?isosorbide mononitrate (IMDUR) 30 MG 24 hr tablet Take 1 tablet (30 mg total) by mouth daily. 06/17/18  Yes Dustin Flock, MD  ?levothyroxine (SYNTHROID, LEVOTHROID) 75 MCG tablet Take 75 mcg by mouth daily before breakfast.  03/28/18  Yes [provider]  ?losartan (COZAAR) 50 MG tablet Take 50 mg by mouth daily. 05/29/18  Yes [provider]  ?metFORMIN (GLUCOPHAGE) 1000 MG tablet Take 1,000 mg by mouth 2 (two) times daily with a meal. 05/16/18  Yes [provider]  ?metoprolol succinate (TOPROL-XL) 50 MG 24 hr tablet Take 50 mg by mouth daily. 05/16/18  Yes [provider]  ?Multiple Vitamin (MULTIVITAMIN WITH MINERALS) TABS tablet Take 1 tablet by mouth daily. Centrum   Yes [provider]  ?TURMERIC PO Take by mouth.   Yes [provider]  ?Omega-3  Fatty Acids (SALMON OIL-1000 PO) Take 1,000 mg by mouth daily. ?Patient not taking: Reported on 01/21/2022    [provider]  ? ? ?Allergies as of 12/29/2021  ? (No Known Allergies)  ? ? ?Family History  ?Problem Relation Age of Onset  ? Strabismus Mother   ? Stroke Mother   ? CAD Father   ? ? ?Social History  ? ?Socioeconomic History  ? Marital status: Divorced  ?  Spouse name: Not on file  ? Number of children: Not  on file  ? Years of education: Not on file  ? Highest education level: Not on file  ?Occupational History  ? Not on file  ?Tobacco Use  ? Smoking status: Former  ?  Types: Cigarettes  ?  Quit date: 2014  ?  Years since quitting: 9.2  ? Smokeless tobacco: Never  ?Vaping Use  ? Vaping Use: Never used  ?Substance and Sexual Activity  ? Alcohol use: Not Currently  ? Drug use: Never  ? Sexual activity: Not on file  ?Other Topics Concern  ? Not on file  ?Social History Narrative  ? Not on file  ? ?Social Determinants of Health  ? ?Financial Resource Strain: Not on file  ?Food Insecurity: Not on file  ?Transportation Needs: Not on file  ?Physical Activity: Not on file  ?Stress: Not on file  ?Social Connections: Not on file  ?Intimate Partner Violence: Not on file  ? ? ?Review of Systems: ?See HPI, otherwise negative ROS ? ?Physical Exam: ?BP (!) 183/68   Pulse 63   Temp 97.6 ?F (36.4 ?C) (Temporal)   Resp 16   Ht '5\' 9"'$  (1.753 m)   Wt 103.4 kg   SpO2 97%   BMI 33.67 kg/m?  ?General:   Alert,  pleasant and cooperative in NAD ?Head:  Normocephalic and atraumatic. ?Lungs:  Clear to auscultation.    ?Heart:  Regular rate and rhythm.  ? ?Impression/Plan: ?Jason Mclean. is here for ophthalmic surgery. ? ?Risks, benefits, limitations, and alternatives regarding ophthalmic surgery have been reviewed with the patient.  Questions have been answered.  All parties agreeable. ? ? Leandrew Koyanagi, MD  02/10/2022, 7:38 AM ? ? ?

## 2022-02-10 NOTE — Anesthesia Postprocedure Evaluation (Signed)
Anesthesia Post Note ? ?Patient: Jason Mclean. ? ?Procedure(s) Performed: CATARACT EXTRACTION PHACO AND INTRAOCULAR LENS PLACEMENT (IOC) RIGHT DIABETIC 10.48 01:19.8 (Right: Eye) ? ? ?  ?Patient location during evaluation: PACU ?Anesthesia Type: MAC ?Level of consciousness: awake and alert ?Pain management: pain level controlled ?Vital Signs Assessment: post-procedure vital signs reviewed and stable ?Respiratory status: spontaneous breathing, nonlabored ventilation, respiratory function stable and patient connected to nasal cannula oxygen ?Cardiovascular status: stable and blood pressure returned to baseline ?Postop Assessment: no apparent nausea or vomiting ?Anesthetic complications: no ? ? ?No notable events documented. ? ?Fidel Levy ? ? ? ? ? ?

## 2022-02-10 NOTE — Anesthesia Preprocedure Evaluation (Signed)
Anesthesia Evaluation  ?Patient identified by MRN, date of birth, ID band ?Patient awake ? ? ? ?Reviewed: ?NPO status  ? ?History of Anesthesia Complications ?Negative for: history of anesthetic complications ? ?Airway ?Mallampati: II ? ?TM Distance: >3 FB ?Neck ROM: full ? ? ? Dental ?no notable dental hx. ? ?  ?Pulmonary ?COPD (mild), former smoker,  ?  ?Pulmonary exam normal ? ? ? ? ? ? ? Cardiovascular ?Exercise Tolerance: Good ?hypertension, (-) angina+ CAD, + Past MI (2020) and + Cardiac Stents (x6)  ?Normal cardiovascular exam ? ?Hx PVC ?  ?Neuro/Psych ?negative neurological ROS ? negative psych ROS  ? GI/Hepatic ?negative GI ROS, Neg liver ROS,   ?Endo/Other  ?diabetesHypothyroidism Morbid obesity (bmi 34) ? Renal/GU ?  ? ?  ?Musculoskeletal ?Gout ?  ? Abdominal ?  ?Peds ? Hematology ?Testicular cancer 2001   ?Anesthesia Other Findings ?cards: feb 2023 : paraschos; ?pcp: dec 2022: hande; ? ?Last plavix: 3/21; ? ?Had ECCE 2 weeks ago. ? Reproductive/Obstetrics ? ?  ? ? ? ? ? ? ? ? ? ? ? ? ? ?  ?  ? ? ? ? ? ? ? ? ?Anesthesia Physical ?Anesthesia Plan ? ?ASA: 3 ? ?Anesthesia Plan: MAC  ? ?Post-op Pain Management:   ? ?Induction:  ? ?PONV Risk Score and Plan: TIVA ? ?Airway Management Planned:  ? ?Additional Equipment:  ? ?Intra-op Plan:  ? ?Post-operative Plan:  ? ?Informed Consent:  ? ?Plan Discussed with:  ? ?Anesthesia Plan Comments:   ? ? ? ? ? ? ?Anesthesia Quick Evaluation                                  Anesthesia Evaluation  ?Patient identified by MRN, date of birth, ID band ?Patient awake ? ? ? ?Reviewed: ?NPO status  ? ?History of Anesthesia Complications ?Negative for: history of anesthetic complications ? ?Airway ?Mallampati: II ? ?TM Distance: >3 FB ?Neck ROM: full ? ? ? Dental ?no notable dental hx. ? ?  ?Pulmonary ?COPD (mild), former smoker,  ?  ?Pulmonary exam normal ? ? ? ? ? ? ? Cardiovascular ?Exercise Tolerance: Good ?hypertension, (-) angina+ CAD, + Past  MI (2020) and + Cardiac Stents (x6)  ?Normal cardiovascular exam ? ? ?  ?Neuro/Psych ?negative neurological ROS ? negative psych ROS  ? GI/Hepatic ?negative GI ROS, Neg liver ROS,   ?Endo/Other  ?diabetesHypothyroidism Morbid obesity (bmi 33) ? Renal/GU ?negative Renal ROS  ?negative genitourinary ?  ?Musculoskeletal ? ? Abdominal ?  ?Peds ? Hematology ?Testicular cancer 2001   ?Anesthesia Other Findings ?cards: feb 2023 : paraschos; ? ?Last plavix: 3/7 ? Reproductive/Obstetrics ? ?  ? ? ? ? ? ? ? ? ? ? ? ? ? ?  ?  ? ? ? ? ? ? ? ? ?Anesthesia Physical ?Anesthesia Plan ? ?ASA: 3 ? ?Anesthesia Plan: MAC  ? ?Post-op Pain Management:   ? ?Induction:  ? ?PONV Risk Score and Plan: 1 and Midazolam ? ?Airway Management Planned:  ? ?Additional Equipment:  ? ?Intra-op Plan:  ? ?Post-operative Plan:  ? ?Informed Consent: I have reviewed the patients History and Physical, chart, labs and discussed the procedure including the risks, benefits and alternatives for the proposed anesthesia with the patient or authorized representative who has indicated his/her understanding and acceptance.  ? ? ? ? ? ?Plan Discussed with: CRNA ? ?Anesthesia Plan Comments:   ? ? ? ? ? ? ?  Anesthesia Quick Evaluation ?                                  Anesthesia Evaluation  ?Patient identified by MRN, date of birth, ID band ?Patient awake ? ? ? ?Reviewed: ?NPO status  ? ?History of Anesthesia Complications ?Negative for: history of anesthetic complications ? ?Airway ?Mallampati: II ? ?TM Distance: >3 FB ?Neck ROM: full ? ? ? Dental ?no notable dental hx. ? ?  ?Pulmonary ?COPD (mild), former smoker,  ?  ?Pulmonary exam normal ? ? ? ? ? ? ? Cardiovascular ?Exercise Tolerance: Good ?hypertension, (-) angina+ CAD, + Past MI (2020) and + Cardiac Stents (x6)  ?Normal cardiovascular exam ? ? ?  ?Neuro/Psych ?negative neurological ROS ? negative psych ROS  ? GI/Hepatic ?negative GI ROS, Neg liver ROS,   ?Endo/Other  ?diabetesHypothyroidism Morbid obesity (bmi  33) ? Renal/GU ?negative Renal ROS  ?negative genitourinary ?  ?Musculoskeletal ? ? Abdominal ?  ?Peds ? Hematology ?Testicular cancer 2001   ?Anesthesia Other Findings ?cards: feb 2023 : paraschos; ? ?Last plavix: 3/7 ? Reproductive/Obstetrics ? ?  ? ? ? ? ? ? ? ? ? ? ? ? ? ?  ?  ? ? ? ? ? ? ? ? ?Anesthesia Physical ?Anesthesia Plan ? ?ASA: 3 ? ?Anesthesia Plan: MAC  ? ?Post-op Pain Management:   ? ?Induction:  ? ?PONV Risk Score and Plan: 1 and Midazolam ? ?Airway Management Planned:  ? ?Additional Equipment:  ? ?Intra-op Plan:  ? ?Post-operative Plan:  ? ?Informed Consent: I have reviewed the patients History and Physical, chart, labs and discussed the procedure including the risks, benefits and alternatives for the proposed anesthesia with the patient or authorized representative who has indicated his/her understanding and acceptance.  ? ? ? ? ? ?Plan Discussed with: CRNA ? ?Anesthesia Plan Comments:   ? ? ? ? ? ? ?Anesthesia Quick Evaluation ? ? ?

## 2022-02-11 ENCOUNTER — Encounter: Payer: Self-pay | Admitting: Ophthalmology

## 2022-02-25 DIAGNOSIS — R0602 Shortness of breath: Secondary | ICD-10-CM | POA: Diagnosis not present

## 2022-02-25 DIAGNOSIS — E1165 Type 2 diabetes mellitus with hyperglycemia: Secondary | ICD-10-CM | POA: Diagnosis not present

## 2022-02-25 DIAGNOSIS — J410 Simple chronic bronchitis: Secondary | ICD-10-CM | POA: Diagnosis not present

## 2022-02-25 DIAGNOSIS — Z955 Presence of coronary angioplasty implant and graft: Secondary | ICD-10-CM | POA: Diagnosis not present

## 2022-02-25 DIAGNOSIS — Z0000 Encounter for general adult medical examination without abnormal findings: Secondary | ICD-10-CM | POA: Diagnosis not present

## 2022-02-25 DIAGNOSIS — I2510 Atherosclerotic heart disease of native coronary artery without angina pectoris: Secondary | ICD-10-CM | POA: Diagnosis not present

## 2022-02-25 DIAGNOSIS — I10 Essential (primary) hypertension: Secondary | ICD-10-CM | POA: Diagnosis not present

## 2022-02-25 DIAGNOSIS — E039 Hypothyroidism, unspecified: Secondary | ICD-10-CM | POA: Diagnosis not present

## 2022-02-25 DIAGNOSIS — Z125 Encounter for screening for malignant neoplasm of prostate: Secondary | ICD-10-CM | POA: Diagnosis not present

## 2022-03-04 DIAGNOSIS — I2510 Atherosclerotic heart disease of native coronary artery without angina pectoris: Secondary | ICD-10-CM | POA: Diagnosis not present

## 2022-03-04 DIAGNOSIS — Z6833 Body mass index (BMI) 33.0-33.9, adult: Secondary | ICD-10-CM | POA: Diagnosis not present

## 2022-03-04 DIAGNOSIS — E039 Hypothyroidism, unspecified: Secondary | ICD-10-CM | POA: Diagnosis not present

## 2022-03-04 DIAGNOSIS — D649 Anemia, unspecified: Secondary | ICD-10-CM | POA: Diagnosis not present

## 2022-03-04 DIAGNOSIS — E119 Type 2 diabetes mellitus without complications: Secondary | ICD-10-CM | POA: Diagnosis not present

## 2022-03-04 DIAGNOSIS — Z0000 Encounter for general adult medical examination without abnormal findings: Secondary | ICD-10-CM | POA: Diagnosis not present

## 2022-03-04 DIAGNOSIS — Z955 Presence of coronary angioplasty implant and graft: Secondary | ICD-10-CM | POA: Diagnosis not present

## 2022-03-04 DIAGNOSIS — C6211 Malignant neoplasm of descended right testis: Secondary | ICD-10-CM | POA: Diagnosis not present

## 2022-03-04 DIAGNOSIS — Z1389 Encounter for screening for other disorder: Secondary | ICD-10-CM | POA: Diagnosis not present

## 2022-03-08 DIAGNOSIS — Z961 Presence of intraocular lens: Secondary | ICD-10-CM | POA: Diagnosis not present

## 2022-03-29 DIAGNOSIS — D2261 Melanocytic nevi of right upper limb, including shoulder: Secondary | ICD-10-CM | POA: Diagnosis not present

## 2022-03-29 DIAGNOSIS — D2271 Melanocytic nevi of right lower limb, including hip: Secondary | ICD-10-CM | POA: Diagnosis not present

## 2022-03-29 DIAGNOSIS — D2262 Melanocytic nevi of left upper limb, including shoulder: Secondary | ICD-10-CM | POA: Diagnosis not present

## 2022-03-29 DIAGNOSIS — D225 Melanocytic nevi of trunk: Secondary | ICD-10-CM | POA: Diagnosis not present

## 2022-03-29 DIAGNOSIS — D2272 Melanocytic nevi of left lower limb, including hip: Secondary | ICD-10-CM | POA: Diagnosis not present

## 2022-03-29 DIAGNOSIS — X32XXXA Exposure to sunlight, initial encounter: Secondary | ICD-10-CM | POA: Diagnosis not present

## 2022-03-29 DIAGNOSIS — L821 Other seborrheic keratosis: Secondary | ICD-10-CM | POA: Diagnosis not present

## 2022-03-29 DIAGNOSIS — L570 Actinic keratosis: Secondary | ICD-10-CM | POA: Diagnosis not present

## 2022-05-27 DIAGNOSIS — E7800 Pure hypercholesterolemia, unspecified: Secondary | ICD-10-CM | POA: Diagnosis not present

## 2022-05-27 DIAGNOSIS — Z9889 Other specified postprocedural states: Secondary | ICD-10-CM | POA: Diagnosis not present

## 2022-05-27 DIAGNOSIS — R0602 Shortness of breath: Secondary | ICD-10-CM | POA: Diagnosis not present

## 2022-05-27 DIAGNOSIS — I2510 Atherosclerotic heart disease of native coronary artery without angina pectoris: Secondary | ICD-10-CM | POA: Diagnosis not present

## 2022-05-27 DIAGNOSIS — I493 Ventricular premature depolarization: Secondary | ICD-10-CM | POA: Diagnosis not present

## 2022-05-27 DIAGNOSIS — I214 Non-ST elevation (NSTEMI) myocardial infarction: Secondary | ICD-10-CM | POA: Diagnosis not present

## 2022-05-27 DIAGNOSIS — I10 Essential (primary) hypertension: Secondary | ICD-10-CM | POA: Diagnosis not present

## 2022-05-27 DIAGNOSIS — J410 Simple chronic bronchitis: Secondary | ICD-10-CM | POA: Diagnosis not present

## 2022-05-27 DIAGNOSIS — Z955 Presence of coronary angioplasty implant and graft: Secondary | ICD-10-CM | POA: Diagnosis not present

## 2022-06-30 DIAGNOSIS — E039 Hypothyroidism, unspecified: Secondary | ICD-10-CM | POA: Diagnosis not present

## 2022-06-30 DIAGNOSIS — Z955 Presence of coronary angioplasty implant and graft: Secondary | ICD-10-CM | POA: Diagnosis not present

## 2022-06-30 DIAGNOSIS — I2510 Atherosclerotic heart disease of native coronary artery without angina pectoris: Secondary | ICD-10-CM | POA: Diagnosis not present

## 2022-06-30 DIAGNOSIS — E1165 Type 2 diabetes mellitus with hyperglycemia: Secondary | ICD-10-CM | POA: Diagnosis not present

## 2022-06-30 DIAGNOSIS — C6211 Malignant neoplasm of descended right testis: Secondary | ICD-10-CM | POA: Diagnosis not present

## 2022-06-30 DIAGNOSIS — D649 Anemia, unspecified: Secondary | ICD-10-CM | POA: Diagnosis not present

## 2022-07-07 DIAGNOSIS — D649 Anemia, unspecified: Secondary | ICD-10-CM | POA: Diagnosis not present

## 2022-07-07 DIAGNOSIS — Z955 Presence of coronary angioplasty implant and graft: Secondary | ICD-10-CM | POA: Diagnosis not present

## 2022-07-07 DIAGNOSIS — E119 Type 2 diabetes mellitus without complications: Secondary | ICD-10-CM | POA: Diagnosis not present

## 2022-07-07 DIAGNOSIS — Z87891 Personal history of nicotine dependence: Secondary | ICD-10-CM | POA: Diagnosis not present

## 2022-07-07 DIAGNOSIS — Z8547 Personal history of malignant neoplasm of testis: Secondary | ICD-10-CM | POA: Diagnosis not present

## 2022-07-07 DIAGNOSIS — E039 Hypothyroidism, unspecified: Secondary | ICD-10-CM | POA: Diagnosis not present

## 2022-07-07 DIAGNOSIS — I2510 Atherosclerotic heart disease of native coronary artery without angina pectoris: Secondary | ICD-10-CM | POA: Diagnosis not present

## 2022-07-07 DIAGNOSIS — J449 Chronic obstructive pulmonary disease, unspecified: Secondary | ICD-10-CM | POA: Diagnosis not present

## 2022-09-02 DIAGNOSIS — H40003 Preglaucoma, unspecified, bilateral: Secondary | ICD-10-CM | POA: Diagnosis not present

## 2022-09-02 DIAGNOSIS — H524 Presbyopia: Secondary | ICD-10-CM | POA: Diagnosis not present

## 2022-09-20 DIAGNOSIS — M8588 Other specified disorders of bone density and structure, other site: Secondary | ICD-10-CM | POA: Diagnosis not present

## 2022-09-20 DIAGNOSIS — M5136 Other intervertebral disc degeneration, lumbar region: Secondary | ICD-10-CM | POA: Diagnosis not present

## 2022-09-20 DIAGNOSIS — M79605 Pain in left leg: Secondary | ICD-10-CM | POA: Diagnosis not present

## 2022-09-20 DIAGNOSIS — M5416 Radiculopathy, lumbar region: Secondary | ICD-10-CM | POA: Diagnosis not present

## 2022-09-20 DIAGNOSIS — M545 Low back pain, unspecified: Secondary | ICD-10-CM | POA: Diagnosis not present

## 2022-09-20 DIAGNOSIS — E1165 Type 2 diabetes mellitus with hyperglycemia: Secondary | ICD-10-CM | POA: Diagnosis not present

## 2022-09-20 DIAGNOSIS — M47816 Spondylosis without myelopathy or radiculopathy, lumbar region: Secondary | ICD-10-CM | POA: Diagnosis not present

## 2022-09-30 DIAGNOSIS — Z23 Encounter for immunization: Secondary | ICD-10-CM | POA: Diagnosis not present

## 2022-09-30 DIAGNOSIS — R0602 Shortness of breath: Secondary | ICD-10-CM | POA: Diagnosis not present

## 2022-09-30 DIAGNOSIS — J410 Simple chronic bronchitis: Secondary | ICD-10-CM | POA: Diagnosis not present

## 2022-09-30 DIAGNOSIS — Z9889 Other specified postprocedural states: Secondary | ICD-10-CM | POA: Diagnosis not present

## 2022-09-30 DIAGNOSIS — Z955 Presence of coronary angioplasty implant and graft: Secondary | ICD-10-CM | POA: Diagnosis not present

## 2022-09-30 DIAGNOSIS — I493 Ventricular premature depolarization: Secondary | ICD-10-CM | POA: Diagnosis not present

## 2022-09-30 DIAGNOSIS — I10 Essential (primary) hypertension: Secondary | ICD-10-CM | POA: Diagnosis not present

## 2022-09-30 DIAGNOSIS — I214 Non-ST elevation (NSTEMI) myocardial infarction: Secondary | ICD-10-CM | POA: Diagnosis not present

## 2022-09-30 DIAGNOSIS — I2510 Atherosclerotic heart disease of native coronary artery without angina pectoris: Secondary | ICD-10-CM | POA: Diagnosis not present

## 2022-10-19 DIAGNOSIS — M5126 Other intervertebral disc displacement, lumbar region: Secondary | ICD-10-CM | POA: Diagnosis not present

## 2022-10-19 DIAGNOSIS — M5416 Radiculopathy, lumbar region: Secondary | ICD-10-CM | POA: Diagnosis not present

## 2022-11-10 DIAGNOSIS — Z0100 Encounter for examination of eyes and vision without abnormal findings: Secondary | ICD-10-CM | POA: Diagnosis not present

## 2022-11-11 DIAGNOSIS — Z955 Presence of coronary angioplasty implant and graft: Secondary | ICD-10-CM | POA: Diagnosis not present

## 2022-11-11 DIAGNOSIS — E1165 Type 2 diabetes mellitus with hyperglycemia: Secondary | ICD-10-CM | POA: Diagnosis not present

## 2022-11-11 DIAGNOSIS — I2510 Atherosclerotic heart disease of native coronary artery without angina pectoris: Secondary | ICD-10-CM | POA: Diagnosis not present

## 2022-11-11 DIAGNOSIS — D649 Anemia, unspecified: Secondary | ICD-10-CM | POA: Diagnosis not present

## 2022-11-11 DIAGNOSIS — E039 Hypothyroidism, unspecified: Secondary | ICD-10-CM | POA: Diagnosis not present

## 2022-11-11 DIAGNOSIS — C6200 Malignant neoplasm of unspecified undescended testis: Secondary | ICD-10-CM | POA: Diagnosis not present

## 2022-11-18 DIAGNOSIS — E039 Hypothyroidism, unspecified: Secondary | ICD-10-CM | POA: Diagnosis not present

## 2022-11-18 DIAGNOSIS — Z0000 Encounter for general adult medical examination without abnormal findings: Secondary | ICD-10-CM | POA: Diagnosis not present

## 2022-11-18 DIAGNOSIS — J449 Chronic obstructive pulmonary disease, unspecified: Secondary | ICD-10-CM | POA: Diagnosis not present

## 2022-11-18 DIAGNOSIS — E119 Type 2 diabetes mellitus without complications: Secondary | ICD-10-CM | POA: Diagnosis not present

## 2022-11-18 DIAGNOSIS — Z1331 Encounter for screening for depression: Secondary | ICD-10-CM | POA: Diagnosis not present

## 2022-11-18 DIAGNOSIS — M5416 Radiculopathy, lumbar region: Secondary | ICD-10-CM | POA: Diagnosis not present

## 2022-11-18 DIAGNOSIS — I2510 Atherosclerotic heart disease of native coronary artery without angina pectoris: Secondary | ICD-10-CM | POA: Diagnosis not present

## 2022-11-18 DIAGNOSIS — Z6833 Body mass index (BMI) 33.0-33.9, adult: Secondary | ICD-10-CM | POA: Diagnosis not present

## 2022-11-23 ENCOUNTER — Other Ambulatory Visit: Payer: Self-pay | Admitting: Family Medicine

## 2022-11-23 DIAGNOSIS — M5416 Radiculopathy, lumbar region: Secondary | ICD-10-CM | POA: Diagnosis not present

## 2022-11-23 DIAGNOSIS — M5126 Other intervertebral disc displacement, lumbar region: Secondary | ICD-10-CM | POA: Diagnosis not present

## 2022-11-23 DIAGNOSIS — M4807 Spinal stenosis, lumbosacral region: Secondary | ICD-10-CM | POA: Diagnosis not present

## 2022-11-23 DIAGNOSIS — M5136 Other intervertebral disc degeneration, lumbar region: Secondary | ICD-10-CM | POA: Diagnosis not present

## 2022-12-24 ENCOUNTER — Ambulatory Visit
Admission: RE | Admit: 2022-12-24 | Discharge: 2022-12-24 | Disposition: A | Discharge status: Home / Self Care | Payer: Medicare HMO | Source: Ambulatory Visit | Attending: Family Medicine | Admitting: Family Medicine

## 2022-12-24 DIAGNOSIS — M25552 Pain in left hip: Secondary | ICD-10-CM | POA: Diagnosis not present

## 2022-12-24 DIAGNOSIS — M47816 Spondylosis without myelopathy or radiculopathy, lumbar region: Secondary | ICD-10-CM | POA: Diagnosis not present

## 2022-12-24 DIAGNOSIS — M545 Low back pain, unspecified: Secondary | ICD-10-CM | POA: Diagnosis not present

## 2022-12-24 DIAGNOSIS — M48061 Spinal stenosis, lumbar region without neurogenic claudication: Secondary | ICD-10-CM | POA: Diagnosis not present

## 2022-12-24 DIAGNOSIS — M5416 Radiculopathy, lumbar region: Secondary | ICD-10-CM

## 2023-01-12 ENCOUNTER — Encounter: Payer: Self-pay | Admitting: Neurosurgery

## 2023-01-12 NOTE — Progress Notes (Unsigned)
Referring Physician:  [address], [place],  [place] [address]  Primary Physician:  Tracie Harrier, MD  History of Present Illness: 01/13/2023 [name] is here today with a chief complaint of low back and buttock pain with radiation to the left anterior lateral thigh and and anterior shin and will occasionally go into his foot.  He has been having pain for the past 6 months or so.  He reports sharp and throbbing pain on into his anterior thigh.  Standing and walking make it worse.  He is concerned that his leg will give out at times.  Stairs make it worse.  Nothing is really helped.  He did have an injection which helped during the anesthetic phase but did not persist.  He recently had a change in his diabetes medications.  His last A1c is 8.7% 2 months ago. Bowel/Bladder Dysfunction: none  Conservative measures:  Physical therapy: has not participated in Multimodal medical therapy including regular antiinflammatories: tylenol, gabapentin, tramadol Injections: has received epidural steroid injections 10/19/2022: Left L3-4 and left L4-5 transforaminal ESI (dexamethasone 8 mg)-no relief 05/29/2019: Left L3-4 and left L4-5 transforaminal ESI (complete relief) 04/18/2019: Left L3-4 and left L4-5 transforaminal ESI (moderate relief x3 days, then sustained mild relief)   Past Surgery: denies  Garth C Gwenevere Mclean. has no symptoms of cervical myelopathy.  The symptoms are causing a significant impact on the patient's life.   I have utilized the care everywhere function in epic to review the outside records available from external health systems.  Review of Systems:  A 10 point review of systems is negative, except for the pertinent positives and negatives detailed in the HPI.  Past Medical History: Past Medical History:  Diagnosis Date   Coronary artery disease    s/p three coronary stents   Diabetes mellitus without complication ([place])     Hypercholesterolemia    Hypertension    Hypothyroidism    Testicular cancer [facility])     Past Surgical History: Past Surgical History:  Procedure Laterality Date   CATARACT EXTRACTION W/PHACO Left 01/27/2022   Procedure: CATARACT EXTRACTION PHACO AND INTRAOCULAR LENS PLACEMENT ([place]) LEFT DIABETIC 6.17 01:17.1;  Surgeon: Leandrew Koyanagi, MD;  Location: [facility];  Service: Ophthalmology;  Laterality: Left;  Diabetic   CATARACT EXTRACTION W/PHACO Right 02/10/2022   Procedure: CATARACT EXTRACTION PHACO AND INTRAOCULAR LENS PLACEMENT ([facility]) RIGHT DIABETIC 10.48 01:19.8;  Surgeon: Leandrew Koyanagi, MD;  Location: [place];  Service: Ophthalmology;  Laterality: Right;  Diabetic   CORONARY ANGIOPLASTY WITH STENT PLACEMENT     CORONARY BALLOON ANGIOPLASTY N/A 01/29/2020   Procedure: CORONARY STENT INTERVENTION;  Surgeon: Isaias Cowman, MD;  Location: [place] [facility];  Service: Cardiovascular;  Laterality: N/A;   CORONARY STENT INTERVENTION N/A 06/16/2018   Procedure: CORONARY STENT INTERVENTION;  Surgeon: Isaias Cowman, MD;  Location: [place] [facility];  Service: Cardiovascular;  Laterality: N/A;   LEFT HEART CATH AND CORONARY ANGIOGRAPHY N/A 06/16/2018   Procedure: LEFT HEART CATH AND CORONARY ANGIOGRAPHY;  Surgeon: Isaias Cowman, MD;  Location: [place] [facility];  Service: Cardiovascular;  Laterality: N/A;   LEFT HEART CATH AND CORONARY ANGIOGRAPHY Left 01/29/2020   Procedure: LEFT HEART CATH AND CORONARY ANGIOGRAPHY;  Surgeon: Isaias Cowman, MD;  Location: [place] [facility];  Service: Cardiovascular;  Laterality: Left;    Allergies: Allergies as of 01/13/2023   (No Known Allergies)    Medications: Current Meds  Medication Sig  amLODipine (NORVASC) 2.5 MG tablet Take 2.5 mg by mouth daily.   aspirin EC 81 MG tablet Take 81 mg by mouth daily.    atorvastatin (LIPITOR) 80 MG tablet Take 1 tablet (80 mg total) by mouth daily at 6  PM.   Cholecalciferol (VITAMIN D3) 50 MCG (2000 UT) TABS Take 2,000 Units by mouth daily.   clopidogrel (PLAVIX) 75 MG tablet Take 1 tablet (75 mg total) by mouth daily with breakfast.   Coenzyme Q10 100 MG TABS Take 100 mg by mouth every other day.    glimepiride (AMARYL) 2 MG tablet Take 2 mg by mouth daily.   isosorbide mononitrate (IMDUR) 30 MG 24 hr tablet Take 1 tablet (30 mg total) by mouth daily.   levothyroxine (SYNTHROID, LEVOTHROID) 75 MCG tablet Take 75 mcg by mouth daily before breakfast.    losartan (COZAAR) 50 MG tablet Take 50 mg by mouth daily.   metFORMIN (GLUCOPHAGE) 1000 MG tablet Take 1,000 mg by mouth 2 (two) times daily with a meal.   metoprolol succinate (TOPROL-XL) 50 MG 24 hr tablet Take 50 mg by mouth daily.   Multiple Vitamin (MULTIVITAMIN WITH MINERALS) TABS tablet Take 1 tablet by mouth daily. Centrum   Omega-3 Fatty Acids (SALMON OIL-1000 PO) Take 1,000 mg by mouth daily.   RYBELSUS 3 MG TABS Take 1 tablet by mouth daily.   traMADol (ULTRAM) 50 MG tablet Take 50-100 mg by mouth 2 (two) times daily as needed.   TURMERIC PO Take by mouth.   [DISCONTINUED] gabapentin (NEURONTIN) 100 MG capsule Take 100 mg by mouth at bedtime.    Social History: Social History   Tobacco Use   Smoking status: Former    Types: Cigarettes    Quit date: 2014    Years since quitting: 10.1   Smokeless tobacco: Never  Vaping Use   Vaping Use: Never used  Substance Use Topics   Alcohol use: Not Currently   Drug use: Never    Family Medical History: Family History  Problem Relation Age of Onset   Strabismus Mother    Stroke Mother    CAD Father     Physical Examination: Vitals:   01/13/23 0822  BP: 130/78    General: Patient is well developed, well nourished, calm, collected, and in no apparent distress. Attention to examination is appropriate.  Neck:   Supple.  Full range of motion.  Respiratory: Patient is breathing without any difficulty.   NEUROLOGICAL:      Awake, alert, oriented to person, place, and time.  Speech is clear and fluent.   Cranial Nerves: Pupils equal round and reactive to light.  Facial tone is symmetric.  Facial sensation is symmetric. Shoulder shrug is symmetric. Tongue protrusion is midline.  There is no pronator drift.  ROM of spine: full.    Strength: Side Biceps Triceps Deltoid Interossei Grip Wrist Ext. Wrist Flex.  R 5 5 5 5 5 5 5  $ L 5 5 5 5 5 5 5   $ Side Iliopsoas Quads Hamstring PF DF EHL  R 5 5 5 5 5 5  $ L 4+ 4+ 5 5 5 5   $ Reflexes are 1+ and symmetric at the biceps, triceps, brachioradialis, patella and achilles.   Hoffman's is absent.   Bilateral upper and lower extremity sensation is intact to light touch.    No evidence of dysmetria noted.  Gait is normal.     Medical Decision Making  Imaging: MRI L spine 12/24/22 Alignment:  Normal.  Vertebrae: Modic type 1 degenerative endplate marrow signal changes at L1-2 and L2-3.   Conus medullaris and cauda equina: Conus extends to the T12-L1 level. Conus and cauda equina appear normal.   Paraspinal and other soft tissues: Unremarkable.   Disc levels:   T12-L1:  Normal.   L1-L2: Small disc bulge and mild bilateral facet arthropathy. No spinal canal stenosis or neural foraminal narrowing.   L2-L3: Disc bulge and facet arthropathy results in mild spinal canal stenosis and mild bilateral neural foraminal narrowing.   L3-L4: Left eccentric disc bulge and facet arthropathy results in mild spinal canal stenosis, severe left and moderate right neural foraminal narrowing.   L4-L5: Right eccentric disc bulge and facet arthropathy results in mild left and moderate right neural foraminal narrowing. No significant spinal canal stenosis.   L5-S1: No disc herniation, spinal canal stenosis or neural foraminal narrowing. Mild bilateral facet arthropathy.   IMPRESSION: 1. Multilevel lumbar spondylosis, worst at L3-L4 where there is mild spinal canal  stenosis, severe left and moderate right neural foraminal narrowing. 2. Mild spinal canal stenosis at L2-3. 3. Moderate right neural foraminal narrowing at L4-5.     Electronically Signed   By: Emmit Alexanders M.D.   On: 12/24/2022 13:11  I have personally reviewed the images and agree with the above interpretation.  Assessment and Plan: Mr. Fobbs is a pleasant 77 y.o. male with lumbar radiculopathy due to severe left L3-4 foraminal stenosis.  He has longstanding left leg weakness that he reports has been ongoing for 1 to 2 years.  At this point, I think he may benefit from physical therapy.  I will refer him for physical therapy.  He if he does not improve from this, we will consider left-sided far lateral foraminotomy.  He is checking his blood sugars once a day and recently had a change in his meds.  He will need to have his A1c under 7.5% to consider surgical intervention.  I spent a total of 30 minutes in this patient's care today. This time was spent reviewing pertinent records including imaging studies, obtaining and confirming history, performing a directed evaluation, formulating and discussing my recommendations, and documenting the visit within the medical record.    Thank you for involving me in the care of this patient.      Shae Augello K. Izora Ribas MD, [facility]

## 2023-01-13 ENCOUNTER — Encounter: Payer: Self-pay | Admitting: Neurosurgery

## 2023-01-13 ENCOUNTER — Ambulatory Visit: Payer: Medicare HMO | Admitting: Neurosurgery

## 2023-01-13 VITALS — BP 130/78 | Ht 69.0 in | Wt 211.6 lb

## 2023-01-13 DIAGNOSIS — M5416 Radiculopathy, lumbar region: Secondary | ICD-10-CM

## 2023-01-13 MED ORDER — GABAPENTIN 300 MG PO CAPS: 300.0000 mg | ORAL_CAPSULE | Freq: Three times a day (TID) | ORAL | 0 refills | Status: DC

## 2023-01-26 DIAGNOSIS — E039 Hypothyroidism, unspecified: Secondary | ICD-10-CM | POA: Diagnosis not present

## 2023-01-26 DIAGNOSIS — E1165 Type 2 diabetes mellitus with hyperglycemia: Secondary | ICD-10-CM | POA: Diagnosis not present

## 2023-01-26 DIAGNOSIS — I2510 Atherosclerotic heart disease of native coronary artery without angina pectoris: Secondary | ICD-10-CM | POA: Diagnosis not present

## 2023-01-26 DIAGNOSIS — J449 Chronic obstructive pulmonary disease, unspecified: Secondary | ICD-10-CM | POA: Diagnosis not present

## 2023-02-01 DIAGNOSIS — I493 Ventricular premature depolarization: Secondary | ICD-10-CM | POA: Diagnosis not present

## 2023-02-01 DIAGNOSIS — I2510 Atherosclerotic heart disease of native coronary artery without angina pectoris: Secondary | ICD-10-CM | POA: Diagnosis not present

## 2023-02-01 DIAGNOSIS — E7800 Pure hypercholesterolemia, unspecified: Secondary | ICD-10-CM | POA: Diagnosis not present

## 2023-02-01 DIAGNOSIS — Z955 Presence of coronary angioplasty implant and graft: Secondary | ICD-10-CM | POA: Diagnosis not present

## 2023-02-01 DIAGNOSIS — I10 Essential (primary) hypertension: Secondary | ICD-10-CM | POA: Diagnosis not present

## 2023-02-01 DIAGNOSIS — Z23 Encounter for immunization: Secondary | ICD-10-CM | POA: Diagnosis not present

## 2023-02-01 DIAGNOSIS — Z9889 Other specified postprocedural states: Secondary | ICD-10-CM | POA: Diagnosis not present

## 2023-02-01 DIAGNOSIS — J410 Simple chronic bronchitis: Secondary | ICD-10-CM | POA: Diagnosis not present

## 2023-02-01 DIAGNOSIS — I214 Non-ST elevation (NSTEMI) myocardial infarction: Secondary | ICD-10-CM | POA: Diagnosis not present

## 2023-02-28 DIAGNOSIS — M545 Low back pain, unspecified: Secondary | ICD-10-CM | POA: Diagnosis not present

## 2023-03-03 DIAGNOSIS — M545 Low back pain, unspecified: Secondary | ICD-10-CM | POA: Diagnosis not present

## 2023-03-07 DIAGNOSIS — M545 Low back pain, unspecified: Secondary | ICD-10-CM | POA: Diagnosis not present

## 2023-03-10 DIAGNOSIS — M545 Low back pain, unspecified: Secondary | ICD-10-CM | POA: Diagnosis not present

## 2023-03-14 DIAGNOSIS — M545 Low back pain, unspecified: Secondary | ICD-10-CM | POA: Diagnosis not present

## 2023-03-15 ENCOUNTER — Ambulatory Visit: Payer: Medicare HMO | Admitting: Neurosurgery

## 2023-03-15 ENCOUNTER — Encounter: Payer: Self-pay | Admitting: Neurosurgery

## 2023-03-15 VITALS — BP 175/77 | HR 65 | Ht 69.0 in | Wt 214.4 lb

## 2023-03-15 DIAGNOSIS — M48061 Spinal stenosis, lumbar region without neurogenic claudication: Secondary | ICD-10-CM | POA: Diagnosis not present

## 2023-03-15 DIAGNOSIS — M5416 Radiculopathy, lumbar region: Secondary | ICD-10-CM

## 2023-03-15 NOTE — Progress Notes (Signed)
Referring Physician:  Barbette Reichmann, MD [address] [facility] [place],  [place] [address]  Primary Physician:  Barbette Reichmann, MD  History of Present Illness: 03/15/2023 [name] has been pursuing physical therapy.  He started approximately 3 weeks ago.  He is able to do everything he would like to do currently.  He started losing weight about 6 to 8 weeks ago.  He is lost 14 pounds.  He has an A1c check coming on Thursday.  His sugars have been pretty good in the morning.  01/13/2023 [name] is here today with a chief complaint of low back and buttock pain with radiation to the left anterior lateral thigh and and anterior shin and will occasionally go into his foot.  He has been having pain for the past 6 months or so.  He reports sharp and throbbing pain on into his anterior thigh.  Standing and walking make it worse.  He is concerned that his leg will give out at times.  Stairs make it worse.  Nothing is really helped.  He did have an injection which helped during the anesthetic phase but did not persist.  He recently had a change in his diabetes medications.  His last A1c is 8.7% 2 months ago. Bowel/Bladder Dysfunction: none  Conservative measures:  Physical therapy: has not participated in Multimodal medical therapy including regular antiinflammatories: tylenol, gabapentin, tramadol Injections: has received epidural steroid injections 10/19/2022: Left L3-4 and left L4-5 transforaminal ESI (dexamethasone 8 mg)-no relief 05/29/2019: Left L3-4 and left L4-5 transforaminal ESI (complete relief) 04/18/2019: Left L3-4 and left L4-5 transforaminal ESI (moderate relief x3 days, then sustained mild relief)   Past Surgery: denies  Jason Mclean Severe. has no symptoms of cervical myelopathy.  The symptoms are causing a significant impact on the patient's life.   I have utilized the care everywhere function in epic to review the outside records available from  external health systems.  Review of Systems:  A 10 point review of systems is negative, except for the pertinent positives and negatives detailed in the HPI.  Past Medical History: Past Medical History:  Diagnosis Date   Coronary artery disease    s/p three coronary stents   Diabetes mellitus without complication    Hypercholesterolemia    Hypertension    Hypothyroidism    Testicular cancer     Past Surgical History: Past Surgical History:  Procedure Laterality Date   CATARACT EXTRACTION W/PHACO Left 01/27/2022   Procedure: CATARACT EXTRACTION PHACO AND INTRAOCULAR LENS PLACEMENT ([facility]) LEFT DIABETIC 6.17 01:17.1;  Surgeon: Lockie Mola, MD;  Location: [facility];  Service: Ophthalmology;  Laterality: Left;  Diabetic   CATARACT EXTRACTION W/PHACO Right 02/10/2022   Procedure: CATARACT EXTRACTION PHACO AND INTRAOCULAR LENS PLACEMENT ([facility]) RIGHT DIABETIC 10.48 01:19.8;  Surgeon: Lockie Mola, MD;  Location: [facility];  Service: Ophthalmology;  Laterality: Right;  Diabetic   CORONARY ANGIOPLASTY WITH STENT PLACEMENT     CORONARY BALLOON ANGIOPLASTY N/A 01/29/2020   Procedure: CORONARY STENT INTERVENTION;  Surgeon: Marcina Millard, MD;  Location: [facility];  Service: Cardiovascular;  Laterality: N/A;   CORONARY STENT INTERVENTION N/A 06/16/2018   Procedure: CORONARY STENT INTERVENTION;  Surgeon: Marcina Millard, MD;  Location: [facility];  Service: Cardiovascular;  Laterality: N/A;   LEFT HEART CATH AND CORONARY ANGIOGRAPHY N/A 06/16/2018   Procedure: LEFT HEART CATH AND CORONARY ANGIOGRAPHY;  Surgeon: Marcina Millard, MD;  Location: [facility];  Service: Cardiovascular;  Laterality: N/A;   LEFT HEART CATH AND CORONARY ANGIOGRAPHY Left 01/29/2020   Procedure: LEFT HEART CATH AND CORONARY ANGIOGRAPHY;  Surgeon: Marcina Millard, MD;  Location: [facility];  Service: Cardiovascular;  Laterality: Left;     Allergies: Allergies as of 03/15/2023   (No Known Allergies)    Medications: Current Meds  Medication Sig   gabapentin (NEURONTIN) 100 MG capsule Take 100 mg by mouth at bedtime.    Social History: Social History   Tobacco Use   Smoking status: Former    Types: Cigarettes    Quit date: 2014    Years since quitting: 10.3   Smokeless tobacco: Never  Vaping Use   Vaping Use: Never used  Substance Use Topics   Alcohol use: Not Currently   Drug use: Never    Family Medical History: Family History  Problem Relation Age of Onset   Strabismus Mother    Stroke Mother    CAD Father     Physical Examination: Vitals:   03/15/23 0942  BP: (!) 175/77  Pulse: 65     General: Patient is well developed, well nourished, calm, collected, and in no apparent distress. Attention to examination is appropriate.  Neck:   Supple.  Full range of motion.  Respiratory: Patient is breathing without any difficulty.   NEUROLOGICAL:     Awake, alert, oriented to person, place, and time.  Speech is clear and fluent.   Cranial Nerves: Pupils equal round and reactive to light.  Facial tone is symmetric.  Facial sensation is symmetric. Shoulder shrug is symmetric. Tongue protrusion is midline.  There is no pronator drift.  ROM of spine: full.    Strength: Side Biceps Triceps Deltoid Interossei Grip Wrist Ext. Wrist Flex.  R 5 5 5 5 5 5 5   L 5 5 5 5 5 5 5    Side Iliopsoas Quads Hamstring PF DF EHL  R 5 5 5 5 5 5   L 5 4+ 5 5 5 5    Reflexes are 1+ and symmetric at the biceps, triceps, brachioradialis, patella and achilles.   Hoffman's is absent.   Bilateral upper and lower extremity sensation is intact to light touch.    No evidence of dysmetria noted.  Gait is normal.     Medical Decision Making  Imaging: MRI L spine 12/24/22 Alignment:  Normal.   Vertebrae: Modic type 1 degenerative endplate marrow signal changes at L1-2 and L2-3.   Conus medullaris and cauda equina:  Conus extends to the T12-L1 level. Conus and cauda equina appear normal.   Paraspinal and other soft tissues: Unremarkable.   Disc levels:   T12-L1:  Normal.   L1-L2: Small disc bulge and mild bilateral facet arthropathy. No spinal canal stenosis or neural foraminal narrowing.   L2-L3: Disc bulge and facet arthropathy results in mild spinal canal stenosis and mild bilateral neural foraminal narrowing.   L3-L4: Left eccentric disc bulge and facet arthropathy results in mild spinal canal stenosis, severe left and moderate right neural foraminal narrowing.   L4-L5: Right eccentric disc bulge and facet arthropathy results in mild left and moderate right neural foraminal narrowing. No significant spinal canal stenosis.   L5-S1: No disc herniation, spinal canal stenosis or neural foraminal narrowing. Mild bilateral facet arthropathy.   IMPRESSION: 1. Multilevel lumbar spondylosis, worst at L3-L4 where there is mild spinal canal stenosis, severe left and moderate right neural foraminal narrowing. 2. Mild spinal canal stenosis at L2-3. 3. Moderate right neural  foraminal narrowing at L4-5.     Electronically Signed   By: Orvan Falconer M.D.   On: 12/24/2022 13:11  I have personally reviewed the images and agree with the above interpretation.  Assessment and Plan: Mr. Pastorino is a pleasant 77 y.o. male with lumbar radiculopathy due to severe left L3-4 foraminal stenosis.  He has longstanding left leg weakness that he reports has been ongoing for 1 to 2 years.  He is slightly better from physical therapy.  I think he should continue this for now.  I will touch base with him in 3 weeks to consider how his symptoms are currently impacting him.  If his pain is altering his daily life, we will consider left-sided far lateral foraminotomy at L3-4.  He is checking his blood sugars once a day and recently had a change in his meds.  He will need to have his A1c under 7.5% to consider surgical  intervention.  I spent a total of 10 minutes in this patient's care today. This time was spent reviewing pertinent records including imaging studies, obtaining and confirming history, performing a directed evaluation, formulating and discussing my recommendations, and documenting the visit within the medical record.    Thank you for involving me in the care of this patient.      Ryelle Ruvalcaba K. Myer Haff MD, [facility]

## 2023-03-17 DIAGNOSIS — M5416 Radiculopathy, lumbar region: Secondary | ICD-10-CM | POA: Diagnosis not present

## 2023-03-17 DIAGNOSIS — E039 Hypothyroidism, unspecified: Secondary | ICD-10-CM | POA: Diagnosis not present

## 2023-03-17 DIAGNOSIS — E1165 Type 2 diabetes mellitus with hyperglycemia: Secondary | ICD-10-CM | POA: Diagnosis not present

## 2023-03-17 DIAGNOSIS — Z125 Encounter for screening for malignant neoplasm of prostate: Secondary | ICD-10-CM | POA: Diagnosis not present

## 2023-03-17 DIAGNOSIS — I493 Ventricular premature depolarization: Secondary | ICD-10-CM | POA: Diagnosis not present

## 2023-03-17 DIAGNOSIS — I2510 Atherosclerotic heart disease of native coronary artery without angina pectoris: Secondary | ICD-10-CM | POA: Diagnosis not present

## 2023-03-17 DIAGNOSIS — J42 Unspecified chronic bronchitis: Secondary | ICD-10-CM | POA: Diagnosis not present

## 2023-03-22 DIAGNOSIS — M545 Low back pain, unspecified: Secondary | ICD-10-CM | POA: Diagnosis not present

## 2023-03-23 DIAGNOSIS — E039 Hypothyroidism, unspecified: Secondary | ICD-10-CM | POA: Diagnosis not present

## 2023-03-23 DIAGNOSIS — I2510 Atherosclerotic heart disease of native coronary artery without angina pectoris: Secondary | ICD-10-CM | POA: Diagnosis not present

## 2023-03-23 DIAGNOSIS — E1165 Type 2 diabetes mellitus with hyperglycemia: Secondary | ICD-10-CM | POA: Diagnosis not present

## 2023-03-23 DIAGNOSIS — J449 Chronic obstructive pulmonary disease, unspecified: Secondary | ICD-10-CM | POA: Diagnosis not present

## 2023-03-24 DIAGNOSIS — Z1331 Encounter for screening for depression: Secondary | ICD-10-CM | POA: Diagnosis not present

## 2023-03-24 DIAGNOSIS — D649 Anemia, unspecified: Secondary | ICD-10-CM | POA: Diagnosis not present

## 2023-03-24 DIAGNOSIS — Z955 Presence of coronary angioplasty implant and graft: Secondary | ICD-10-CM | POA: Diagnosis not present

## 2023-03-24 DIAGNOSIS — C6211 Malignant neoplasm of descended right testis: Secondary | ICD-10-CM | POA: Diagnosis not present

## 2023-03-24 DIAGNOSIS — E119 Type 2 diabetes mellitus without complications: Secondary | ICD-10-CM | POA: Diagnosis not present

## 2023-03-24 DIAGNOSIS — E663 Overweight: Secondary | ICD-10-CM | POA: Diagnosis not present

## 2023-03-24 DIAGNOSIS — Z6831 Body mass index (BMI) 31.0-31.9, adult: Secondary | ICD-10-CM | POA: Diagnosis not present

## 2023-03-24 DIAGNOSIS — Z0000 Encounter for general adult medical examination without abnormal findings: Secondary | ICD-10-CM | POA: Diagnosis not present

## 2023-03-24 DIAGNOSIS — E039 Hypothyroidism, unspecified: Secondary | ICD-10-CM | POA: Diagnosis not present

## 2023-03-29 DIAGNOSIS — M545 Low back pain, unspecified: Secondary | ICD-10-CM | POA: Diagnosis not present

## 2023-03-31 DIAGNOSIS — L570 Actinic keratosis: Secondary | ICD-10-CM | POA: Diagnosis not present

## 2023-03-31 DIAGNOSIS — D2271 Melanocytic nevi of right lower limb, including hip: Secondary | ICD-10-CM | POA: Diagnosis not present

## 2023-03-31 DIAGNOSIS — Z85828 Personal history of other malignant neoplasm of skin: Secondary | ICD-10-CM | POA: Diagnosis not present

## 2023-03-31 DIAGNOSIS — D2261 Melanocytic nevi of right upper limb, including shoulder: Secondary | ICD-10-CM | POA: Diagnosis not present

## 2023-03-31 DIAGNOSIS — X32XXXA Exposure to sunlight, initial encounter: Secondary | ICD-10-CM | POA: Diagnosis not present

## 2023-03-31 DIAGNOSIS — D2262 Melanocytic nevi of left upper limb, including shoulder: Secondary | ICD-10-CM | POA: Diagnosis not present

## 2023-04-05 ENCOUNTER — Ambulatory Visit: Payer: Medicare HMO | Admitting: Neurosurgery

## 2023-04-05 ENCOUNTER — Encounter: Payer: Self-pay | Admitting: Neurosurgery

## 2023-04-05 VITALS — BP 135/70 | Ht 69.0 in | Wt 214.0 lb

## 2023-04-05 DIAGNOSIS — M545 Low back pain, unspecified: Secondary | ICD-10-CM | POA: Diagnosis not present

## 2023-04-05 DIAGNOSIS — M4726 Other spondylosis with radiculopathy, lumbar region: Secondary | ICD-10-CM | POA: Diagnosis not present

## 2023-04-05 DIAGNOSIS — M5416 Radiculopathy, lumbar region: Secondary | ICD-10-CM

## 2023-04-05 NOTE — Progress Notes (Signed)
Referring Physician:  Barbette Reichmann, MD [address] [facility] [place],  [place] [address]  Primary Physician:  Barbette Reichmann, MD  History of Present Illness: 04/05/2023 [name] continues to have some relative weakness in his left leg, but is able to be very active at home.  He has back pain but his left leg pain is not activity limiting currently.  His most recent A1c is 7.6%.  03/15/2023 [name] has been pursuing physical therapy.  He started approximately 3 weeks ago.  He is able to do everything he would like to do currently.  He started losing weight about 6 to 8 weeks ago.  He is lost 14 pounds.  He has an A1c check coming on Thursday.  His sugars have been pretty good in the morning.  01/13/2023 [name] is here today with a chief complaint of low back and buttock pain with radiation to the left anterior lateral thigh and and anterior shin and will occasionally go into his foot.  He has been having pain for the past 6 months or so.  He reports sharp and throbbing pain on into his anterior thigh.  Standing and walking make it worse.  He is concerned that his leg will give out at times.  Stairs make it worse.  Nothing is really helped.  He did have an injection which helped during the anesthetic phase but did not persist.  He recently had a change in his diabetes medications.  His last A1c is 8.7% 2 months ago. Bowel/Bladder Dysfunction: none  Conservative measures:  Physical therapy: has not participated in Multimodal medical therapy including regular antiinflammatories: tylenol, gabapentin, tramadol Injections: has received epidural steroid injections 10/19/2022: Left L3-4 and left L4-5 transforaminal ESI (dexamethasone 8 mg)-no relief 05/29/2019: Left L3-4 and left L4-5 transforaminal ESI (complete relief) 04/18/2019: Left L3-4 and left L4-5 transforaminal ESI (moderate relief x3 days, then sustained mild relief)   Past Surgery:  denies  Jason Mclean Severe. has no symptoms of cervical myelopathy.  The symptoms are causing a significant impact on the patient's life.   I have utilized the care everywhere function in epic to review the outside records available from external health systems.  Review of Systems:  A 10 point review of systems is negative, except for the pertinent positives and negatives detailed in the HPI.  Past Medical History: Past Medical History:  Diagnosis Date   Coronary artery disease    s/p three coronary stents   Diabetes mellitus without complication (HCC)    Hypercholesterolemia    Hypertension    Hypothyroidism    Testicular cancer [facility])     Past Surgical History: Past Surgical History:  Procedure Laterality Date   CATARACT EXTRACTION W/PHACO Left 01/27/2022   Procedure: CATARACT EXTRACTION PHACO AND INTRAOCULAR LENS PLACEMENT (IOC) LEFT DIABETIC 6.17 01:17.1;  Surgeon: Lockie Mola, MD;  Location: [facility];  Service: Ophthalmology;  Laterality: Left;  Diabetic   CATARACT EXTRACTION W/PHACO Right 02/10/2022   Procedure: CATARACT EXTRACTION PHACO AND INTRAOCULAR LENS PLACEMENT (IOC) RIGHT DIABETIC 10.48 01:19.8;  Surgeon: Lockie Mola, MD;  Location: [facility];  Service: Ophthalmology;  Laterality: Right;  Diabetic   CORONARY ANGIOPLASTY WITH STENT PLACEMENT     CORONARY BALLOON ANGIOPLASTY N/A 01/29/2020   Procedure: CORONARY STENT INTERVENTION;  Surgeon: Marcina Millard, MD;  Location: [facility] IN[facility];  Service: Cardiovascular;  Laterality: N/A;   CORONARY STENT INTERVENTION N/A 06/16/2018   Procedure: CORONARY STENT INTERVENTION;  Surgeon: Marcina Millard, MD;  Location: [facility];  Service: Cardiovascular;  Laterality: N/A;   LEFT HEART CATH AND CORONARY ANGIOGRAPHY N/A 06/16/2018   Procedure: LEFT HEART CATH AND CORONARY ANGIOGRAPHY;  Surgeon: Marcina Millard, MD;  Location: [facility] CV [facility];  Service: Cardiovascular;   Laterality: N/A;   LEFT HEART CATH AND CORONARY ANGIOGRAPHY Left 01/29/2020   Procedure: LEFT HEART CATH AND CORONARY ANGIOGRAPHY;  Surgeon: Marcina Millard, MD;  Location: [facility];  Service: Cardiovascular;  Laterality: Left;    Allergies: Allergies as of 04/05/2023   (No Known Allergies)    Medications: Current Meds  Medication Sig   amLODipine (NORVASC) 2.5 MG tablet Take 2.5 mg by mouth daily.   aspirin EC 81 MG tablet Take 81 mg by mouth daily.    atorvastatin (LIPITOR) 80 MG tablet Take 1 tablet (80 mg total) by mouth daily at 6 PM.   Cholecalciferol (VITAMIN D3) 50 MCG (2000 UT) TABS Take 2,000 Units by mouth daily.   clopidogrel (PLAVIX) 75 MG tablet Take 1 tablet (75 mg total) by mouth daily with breakfast.   Coenzyme Q10 100 MG TABS Take 100 mg by mouth every other day.    gabapentin (NEURONTIN) 100 MG capsule Take 100 mg by mouth at bedtime.   glimepiride (AMARYL) 2 MG tablet Take 2 mg by mouth daily.   isosorbide mononitrate (IMDUR) 30 MG 24 hr tablet Take 1 tablet (30 mg total) by mouth daily.   levothyroxine (SYNTHROID, LEVOTHROID) 75 MCG tablet Take 75 mcg by mouth daily before breakfast.    losartan (COZAAR) 50 MG tablet Take 50 mg by mouth daily.   metFORMIN (GLUCOPHAGE) 1000 MG tablet Take 1,000 mg by mouth 2 (two) times daily with a meal.   metoprolol succinate (TOPROL-XL) 50 MG 24 hr tablet Take 50 mg by mouth daily.   Multiple Vitamin (MULTIVITAMIN WITH MINERALS) TABS tablet Take 1 tablet by mouth daily. Centrum   Omega-3 Fatty Acids (SALMON OIL-1000 PO) Take 1,000 mg by mouth daily.   RYBELSUS 3 MG TABS Take 1 tablet by mouth daily.   traMADol (ULTRAM) 50 MG tablet Take 50-100 mg by mouth 2 (two) times daily as needed.   TURMERIC PO Take by mouth.    Social History: Social History   Tobacco Use   Smoking status: Former    Types: Cigarettes    Quit date: 2014    Years since quitting: 10.3   Smokeless tobacco: Never  Vaping Use   Vaping  Use: Never used  Substance Use Topics   Alcohol use: Not Currently   Drug use: Never    Family Medical History: Family History  Problem Relation Age of Onset   Strabismus Mother    Stroke Mother    CAD Father     Physical Examination: Vitals:   04/05/23 0921  BP: 135/70     General: Patient is well developed, well nourished, calm, collected, and in no apparent distress. Attention to examination is appropriate.  Neck:   Supple.  Full range of motion.  Respiratory: Patient is breathing without any difficulty.   NEUROLOGICAL:     Awake, alert, oriented to person, place, and time.  Speech is clear and fluent.   Cranial Nerves: Pupils equal round and reactive to light.  Facial tone is symmetric.  Facial sensation is symmetric. Shoulder shrug is symmetric. Tongue protrusion is midline.  There is no pronator drift.  ROM of spine: full.    Strength: Side Biceps Triceps  Deltoid Interossei Grip Wrist Ext. Wrist Flex.  R 5 5 5 5 5 5 5   L 5 5 5 5 5 5 5    Side Iliopsoas Quads Hamstring PF DF EHL  R 5 5 5 5 5 5   L 5 4+ 5 5 5 5    Reflexes are 1+ and symmetric at the biceps, triceps, brachioradialis, patella and achilles.   Hoffman's is absent.   Bilateral upper and lower extremity sensation is intact to light touch.    No evidence of dysmetria noted.  Gait is normal.     Medical Decision Making  Imaging: MRI L spine 12/24/22 Alignment:  Normal.   Vertebrae: Modic type 1 degenerative endplate marrow signal changes at L1-2 and L2-3.   Conus medullaris and cauda equina: Conus extends to the T12-L1 level. Conus and cauda equina appear normal.   Paraspinal and other soft tissues: Unremarkable.   Disc levels:   T12-L1:  Normal.   L1-L2: Small disc bulge and mild bilateral facet arthropathy. No spinal canal stenosis or neural foraminal narrowing.   L2-L3: Disc bulge and facet arthropathy results in mild spinal canal stenosis and mild bilateral neural foraminal  narrowing.   L3-L4: Left eccentric disc bulge and facet arthropathy results in mild spinal canal stenosis, severe left and moderate right neural foraminal narrowing.   L4-L5: Right eccentric disc bulge and facet arthropathy results in mild left and moderate right neural foraminal narrowing. No significant spinal canal stenosis.   L5-S1: No disc herniation, spinal canal stenosis or neural foraminal narrowing. Mild bilateral facet arthropathy.   IMPRESSION: 1. Multilevel lumbar spondylosis, worst at L3-L4 where there is mild spinal canal stenosis, severe left and moderate right neural foraminal narrowing. 2. Mild spinal canal stenosis at L2-3. 3. Moderate right neural foraminal narrowing at L4-5.     Electronically Signed   By: Orvan Falconer M.D.   On: 12/24/2022 13:11  I have personally reviewed the images and agree with the above interpretation.  Assessment and Plan: Mr. Goulet is a pleasant 77 y.o. male with lumbar radiculopathy due to severe left L3-4 foraminal stenosis.  He has longstanding left leg weakness that he reports has been ongoing for 1 to 2 years.  He is slightly better from physical therapy.  At this point, his pain is not activity limiting.  We discussed that his left leg pain would be the primary reason to undergo surgical intervention.  At this point, it does not seem to be altering his day-to-day life.  He is having back pain as well as mild relative weakness, but that is longstanding.  He will call back to let me know if his pain worsens.  At that point, we will consider left-sided far lateral discectomy at L3-4.    I spent a total of 10 minutes in this patient's care today. This time was spent reviewing pertinent records including imaging studies, obtaining and confirming history, performing a directed evaluation, formulating and discussing my recommendations, and documenting the visit within the medical record.    Thank you for involving me in the care of  this patient.      Keshara Kiger K. Myer Haff MD, [facility] Neurosurgery

## 2023-04-12 DIAGNOSIS — M545 Low back pain, unspecified: Secondary | ICD-10-CM | POA: Diagnosis not present

## 2023-04-19 DIAGNOSIS — M545 Low back pain, unspecified: Secondary | ICD-10-CM | POA: Diagnosis not present

## 2023-04-26 DIAGNOSIS — M545 Low back pain, unspecified: Secondary | ICD-10-CM | POA: Diagnosis not present

## 2023-05-10 DIAGNOSIS — M545 Low back pain, unspecified: Secondary | ICD-10-CM | POA: Diagnosis not present

## 2023-05-19 DIAGNOSIS — M545 Low back pain, unspecified: Secondary | ICD-10-CM | POA: Diagnosis not present

## 2023-05-24 DIAGNOSIS — J449 Chronic obstructive pulmonary disease, unspecified: Secondary | ICD-10-CM | POA: Diagnosis not present

## 2023-05-24 DIAGNOSIS — E1165 Type 2 diabetes mellitus with hyperglycemia: Secondary | ICD-10-CM | POA: Diagnosis not present

## 2023-05-24 DIAGNOSIS — I2510 Atherosclerotic heart disease of native coronary artery without angina pectoris: Secondary | ICD-10-CM | POA: Diagnosis not present

## 2023-05-24 DIAGNOSIS — M545 Low back pain, unspecified: Secondary | ICD-10-CM | POA: Diagnosis not present

## 2023-05-24 DIAGNOSIS — E039 Hypothyroidism, unspecified: Secondary | ICD-10-CM | POA: Diagnosis not present

## 2023-06-02 DIAGNOSIS — Z955 Presence of coronary angioplasty implant and graft: Secondary | ICD-10-CM | POA: Diagnosis not present

## 2023-06-02 DIAGNOSIS — J410 Simple chronic bronchitis: Secondary | ICD-10-CM | POA: Diagnosis not present

## 2023-06-02 DIAGNOSIS — M545 Low back pain, unspecified: Secondary | ICD-10-CM | POA: Diagnosis not present

## 2023-06-02 DIAGNOSIS — I214 Non-ST elevation (NSTEMI) myocardial infarction: Secondary | ICD-10-CM | POA: Diagnosis not present

## 2023-06-02 DIAGNOSIS — Z9889 Other specified postprocedural states: Secondary | ICD-10-CM | POA: Diagnosis not present

## 2023-06-02 DIAGNOSIS — E7800 Pure hypercholesterolemia, unspecified: Secondary | ICD-10-CM | POA: Diagnosis not present

## 2023-06-07 DIAGNOSIS — M545 Low back pain, unspecified: Secondary | ICD-10-CM | POA: Diagnosis not present

## 2023-06-21 DIAGNOSIS — M545 Low back pain, unspecified: Secondary | ICD-10-CM | POA: Diagnosis not present

## 2023-06-28 DIAGNOSIS — M545 Low back pain, unspecified: Secondary | ICD-10-CM | POA: Diagnosis not present

## 2023-07-05 DIAGNOSIS — Z955 Presence of coronary angioplasty implant and graft: Secondary | ICD-10-CM | POA: Diagnosis not present

## 2023-07-05 DIAGNOSIS — I10 Essential (primary) hypertension: Secondary | ICD-10-CM | POA: Diagnosis not present

## 2023-07-05 DIAGNOSIS — E039 Hypothyroidism, unspecified: Secondary | ICD-10-CM | POA: Diagnosis not present

## 2023-07-05 DIAGNOSIS — Z6831 Body mass index (BMI) 31.0-31.9, adult: Secondary | ICD-10-CM | POA: Diagnosis not present

## 2023-07-05 DIAGNOSIS — D649 Anemia, unspecified: Secondary | ICD-10-CM | POA: Diagnosis not present

## 2023-07-05 DIAGNOSIS — C6211 Malignant neoplasm of descended right testis: Secondary | ICD-10-CM | POA: Diagnosis not present

## 2023-07-05 DIAGNOSIS — M545 Low back pain, unspecified: Secondary | ICD-10-CM | POA: Diagnosis not present

## 2023-07-05 DIAGNOSIS — M48061 Spinal stenosis, lumbar region without neurogenic claudication: Secondary | ICD-10-CM | POA: Diagnosis not present

## 2023-07-05 DIAGNOSIS — E1165 Type 2 diabetes mellitus with hyperglycemia: Secondary | ICD-10-CM | POA: Diagnosis not present

## 2023-07-05 DIAGNOSIS — M47816 Spondylosis without myelopathy or radiculopathy, lumbar region: Secondary | ICD-10-CM | POA: Diagnosis not present

## 2023-07-12 DIAGNOSIS — Z955 Presence of coronary angioplasty implant and graft: Secondary | ICD-10-CM | POA: Diagnosis not present

## 2023-07-12 DIAGNOSIS — E039 Hypothyroidism, unspecified: Secondary | ICD-10-CM | POA: Diagnosis not present

## 2023-07-12 DIAGNOSIS — M545 Low back pain, unspecified: Secondary | ICD-10-CM | POA: Diagnosis not present

## 2023-07-12 DIAGNOSIS — I2510 Atherosclerotic heart disease of native coronary artery without angina pectoris: Secondary | ICD-10-CM | POA: Diagnosis not present

## 2023-07-12 DIAGNOSIS — I10 Essential (primary) hypertension: Secondary | ICD-10-CM | POA: Diagnosis not present

## 2023-07-12 DIAGNOSIS — E119 Type 2 diabetes mellitus without complications: Secondary | ICD-10-CM | POA: Diagnosis not present

## 2023-07-12 DIAGNOSIS — J449 Chronic obstructive pulmonary disease, unspecified: Secondary | ICD-10-CM | POA: Diagnosis not present

## 2023-07-12 DIAGNOSIS — D649 Anemia, unspecified: Secondary | ICD-10-CM | POA: Diagnosis not present

## 2023-07-19 DIAGNOSIS — M545 Low back pain, unspecified: Secondary | ICD-10-CM | POA: Diagnosis not present

## 2023-08-02 DIAGNOSIS — M545 Low back pain, unspecified: Secondary | ICD-10-CM | POA: Diagnosis not present

## 2023-08-09 DIAGNOSIS — M545 Low back pain, unspecified: Secondary | ICD-10-CM | POA: Diagnosis not present

## 2023-08-16 DIAGNOSIS — M545 Low back pain, unspecified: Secondary | ICD-10-CM | POA: Diagnosis not present

## 2023-08-23 DIAGNOSIS — M545 Low back pain, unspecified: Secondary | ICD-10-CM | POA: Diagnosis not present

## 2023-09-05 DIAGNOSIS — H43813 Vitreous degeneration, bilateral: Secondary | ICD-10-CM | POA: Diagnosis not present

## 2023-09-05 DIAGNOSIS — E119 Type 2 diabetes mellitus without complications: Secondary | ICD-10-CM | POA: Diagnosis not present

## 2023-09-05 DIAGNOSIS — H40003 Preglaucoma, unspecified, bilateral: Secondary | ICD-10-CM | POA: Diagnosis not present

## 2023-09-06 DIAGNOSIS — M545 Low back pain, unspecified: Secondary | ICD-10-CM | POA: Diagnosis not present

## 2023-09-13 DIAGNOSIS — M545 Low back pain, unspecified: Secondary | ICD-10-CM | POA: Diagnosis not present

## 2023-10-11 DIAGNOSIS — M545 Low back pain, unspecified: Secondary | ICD-10-CM | POA: Diagnosis not present

## 2023-10-18 DIAGNOSIS — M545 Low back pain, unspecified: Secondary | ICD-10-CM | POA: Diagnosis not present

## 2023-10-25 DIAGNOSIS — M545 Low back pain, unspecified: Secondary | ICD-10-CM | POA: Diagnosis not present

## 2023-11-01 DIAGNOSIS — M545 Low back pain, unspecified: Secondary | ICD-10-CM | POA: Diagnosis not present

## 2023-11-08 DIAGNOSIS — M545 Low back pain, unspecified: Secondary | ICD-10-CM | POA: Diagnosis not present

## 2023-11-14 DIAGNOSIS — I10 Essential (primary) hypertension: Secondary | ICD-10-CM | POA: Diagnosis not present

## 2023-11-14 DIAGNOSIS — E039 Hypothyroidism, unspecified: Secondary | ICD-10-CM | POA: Diagnosis not present

## 2023-11-14 DIAGNOSIS — D649 Anemia, unspecified: Secondary | ICD-10-CM | POA: Diagnosis not present

## 2023-11-24 DIAGNOSIS — Z0000 Encounter for general adult medical examination without abnormal findings: Secondary | ICD-10-CM | POA: Diagnosis not present

## 2023-11-24 DIAGNOSIS — I10 Essential (primary) hypertension: Secondary | ICD-10-CM | POA: Diagnosis not present

## 2023-11-24 DIAGNOSIS — Z6832 Body mass index (BMI) 32.0-32.9, adult: Secondary | ICD-10-CM | POA: Diagnosis not present

## 2023-11-24 DIAGNOSIS — E039 Hypothyroidism, unspecified: Secondary | ICD-10-CM | POA: Diagnosis not present

## 2023-11-24 DIAGNOSIS — E663 Overweight: Secondary | ICD-10-CM | POA: Diagnosis not present

## 2023-11-24 DIAGNOSIS — Z955 Presence of coronary angioplasty implant and graft: Secondary | ICD-10-CM | POA: Diagnosis not present

## 2023-11-24 DIAGNOSIS — J449 Chronic obstructive pulmonary disease, unspecified: Secondary | ICD-10-CM | POA: Diagnosis not present

## 2023-11-24 DIAGNOSIS — K219 Gastro-esophageal reflux disease without esophagitis: Secondary | ICD-10-CM | POA: Diagnosis not present

## 2023-11-24 DIAGNOSIS — E119 Type 2 diabetes mellitus without complications: Secondary | ICD-10-CM | POA: Diagnosis not present

## 2023-11-28 DIAGNOSIS — M545 Low back pain, unspecified: Secondary | ICD-10-CM | POA: Diagnosis not present

## 2023-12-20 DIAGNOSIS — M545 Low back pain, unspecified: Secondary | ICD-10-CM | POA: Diagnosis not present

## 2023-12-27 DIAGNOSIS — I493 Ventricular premature depolarization: Secondary | ICD-10-CM | POA: Diagnosis not present

## 2023-12-27 DIAGNOSIS — E1165 Type 2 diabetes mellitus with hyperglycemia: Secondary | ICD-10-CM | POA: Diagnosis not present

## 2023-12-27 DIAGNOSIS — J410 Simple chronic bronchitis: Secondary | ICD-10-CM | POA: Diagnosis not present

## 2023-12-27 DIAGNOSIS — Z9889 Other specified postprocedural states: Secondary | ICD-10-CM | POA: Diagnosis not present

## 2023-12-27 DIAGNOSIS — I2510 Atherosclerotic heart disease of native coronary artery without angina pectoris: Secondary | ICD-10-CM | POA: Diagnosis not present

## 2023-12-27 DIAGNOSIS — Z955 Presence of coronary angioplasty implant and graft: Secondary | ICD-10-CM | POA: Diagnosis not present

## 2023-12-27 DIAGNOSIS — E7800 Pure hypercholesterolemia, unspecified: Secondary | ICD-10-CM | POA: Diagnosis not present

## 2023-12-27 DIAGNOSIS — I214 Non-ST elevation (NSTEMI) myocardial infarction: Secondary | ICD-10-CM | POA: Diagnosis not present

## 2023-12-28 DIAGNOSIS — M545 Low back pain, unspecified: Secondary | ICD-10-CM | POA: Diagnosis not present

## 2024-01-03 DIAGNOSIS — M545 Low back pain, unspecified: Secondary | ICD-10-CM | POA: Diagnosis not present

## 2024-01-17 DIAGNOSIS — M545 Low back pain, unspecified: Secondary | ICD-10-CM | POA: Diagnosis not present

## 2024-01-22 ENCOUNTER — Observation Stay
Admission: EM | Admit: 2024-01-22 | Discharge: 2024-01-23 | Disposition: A | Discharge status: Home / Self Care | Attending: Internal Medicine | Admitting: Internal Medicine

## 2024-01-22 ENCOUNTER — Other Ambulatory Visit: Payer: Self-pay

## 2024-01-22 ENCOUNTER — Emergency Department

## 2024-01-22 DIAGNOSIS — I25110 Atherosclerotic heart disease of native coronary artery with unstable angina pectoris: Principal | ICD-10-CM | POA: Insufficient documentation

## 2024-01-22 DIAGNOSIS — Z7982 Long term (current) use of aspirin: Secondary | ICD-10-CM | POA: Insufficient documentation

## 2024-01-22 DIAGNOSIS — I200 Unstable angina: Principal | ICD-10-CM

## 2024-01-22 DIAGNOSIS — J449 Chronic obstructive pulmonary disease, unspecified: Secondary | ICD-10-CM | POA: Diagnosis not present

## 2024-01-22 DIAGNOSIS — J432 Centrilobular emphysema: Secondary | ICD-10-CM | POA: Diagnosis not present

## 2024-01-22 DIAGNOSIS — Z8547 Personal history of malignant neoplasm of testis: Secondary | ICD-10-CM | POA: Insufficient documentation

## 2024-01-22 DIAGNOSIS — I2510 Atherosclerotic heart disease of native coronary artery without angina pectoris: Secondary | ICD-10-CM

## 2024-01-22 DIAGNOSIS — Z7984 Long term (current) use of oral hypoglycemic drugs: Secondary | ICD-10-CM | POA: Insufficient documentation

## 2024-01-22 DIAGNOSIS — E1165 Type 2 diabetes mellitus with hyperglycemia: Secondary | ICD-10-CM | POA: Diagnosis not present

## 2024-01-22 DIAGNOSIS — I700 Atherosclerosis of aorta: Secondary | ICD-10-CM | POA: Diagnosis not present

## 2024-01-22 DIAGNOSIS — J849 Interstitial pulmonary disease, unspecified: Secondary | ICD-10-CM | POA: Diagnosis not present

## 2024-01-22 DIAGNOSIS — E039 Hypothyroidism, unspecified: Secondary | ICD-10-CM | POA: Insufficient documentation

## 2024-01-22 DIAGNOSIS — Z87891 Personal history of nicotine dependence: Secondary | ICD-10-CM | POA: Insufficient documentation

## 2024-01-22 DIAGNOSIS — Z79899 Other long term (current) drug therapy: Secondary | ICD-10-CM | POA: Diagnosis not present

## 2024-01-22 DIAGNOSIS — I10 Essential (primary) hypertension: Secondary | ICD-10-CM | POA: Insufficient documentation

## 2024-01-22 DIAGNOSIS — I160 Hypertensive urgency: Secondary | ICD-10-CM | POA: Diagnosis not present

## 2024-01-22 DIAGNOSIS — M79602 Pain in left arm: Secondary | ICD-10-CM | POA: Diagnosis not present

## 2024-01-22 DIAGNOSIS — R079 Chest pain, unspecified: Secondary | ICD-10-CM | POA: Diagnosis not present

## 2024-01-22 DIAGNOSIS — Z955 Presence of coronary angioplasty implant and graft: Secondary | ICD-10-CM | POA: Diagnosis not present

## 2024-01-22 DIAGNOSIS — Z7902 Long term (current) use of antithrombotics/antiplatelets: Secondary | ICD-10-CM | POA: Insufficient documentation

## 2024-01-22 DIAGNOSIS — M79601 Pain in right arm: Secondary | ICD-10-CM | POA: Diagnosis not present

## 2024-01-22 LAB — PROTIME-INR
INR: 0.9 (ref 0.8–1.2)
Prothrombin Time: 12.5 s (ref 11.4–15.2)

## 2024-01-22 LAB — BASIC METABOLIC PANEL
Anion gap: 11 (ref 5–15)
BUN: 18 mg/dL (ref 8–23)
CO2: 23 mmol/L (ref 22–32)
Calcium: 9.1 mg/dL (ref 8.9–10.3)
Chloride: 97 mmol/L — ABNORMAL LOW (ref 98–111)
Creatinine, Ser: 1 mg/dL (ref 0.61–1.24)
GFR, Estimated: >60 mL/min (ref >60)
Glucose, Bld: 339 mg/dL — ABNORMAL HIGH (ref 70–99)
Potassium: 4.4 mmol/L (ref 3.5–5.1)
Sodium: 131 mmol/L — ABNORMAL LOW (ref 135–145)

## 2024-01-22 LAB — TROPONIN I (HIGH SENSITIVITY)
Troponin I (High Sensitivity): 11 ng/L (ref <18)
Troponin I (High Sensitivity): 63 ng/L — ABNORMAL HIGH (ref <18)

## 2024-01-22 LAB — CBC
HCT: 39.9 % (ref 39.0–52.0)
Hemoglobin: 13.3 g/dL (ref 13.0–17.0)
MCH: 30.3 pg (ref 26.0–34.0)
MCHC: 33.3 g/dL (ref 30.0–36.0)
MCV: 90.9 fL (ref 80.0–100.0)
Platelets: 299 10*3/uL (ref 150–400)
RBC: 4.39 MIL/uL (ref 4.22–5.81)
RDW: 13.5 % (ref 11.5–15.5)
WBC: 6.8 10*3/uL (ref 4.0–10.5)
nRBC: 0 % (ref 0.0–0.2)

## 2024-01-22 MED ORDER — METOPROLOL SUCCINATE ER 50 MG PO TB24
50.0000 mg | ORAL_TABLET | Freq: Every day | ORAL | Status: DC
  Administered 2024-01-23: 50 mg via ORAL
  Filled 2024-01-22: qty 1

## 2024-01-22 MED ORDER — HEPARIN BOLUS VIA INFUSION
4000.0000 [IU] | Freq: Once | INTRAVENOUS | Status: AC
  Administered 2024-01-22: 4000 [IU] via INTRAVENOUS
  Filled 2024-01-22: qty 4000

## 2024-01-22 MED ORDER — NICOTINE 14 MG/24HR TD PT24: 14.0000 mg | MEDICATED_PATCH | Freq: Every day | TRANSDERMAL | Status: DC

## 2024-01-22 MED ORDER — HEPARIN (PORCINE) 25000 UT/250ML-% IV SOLN
1200.0000 [IU]/h | INTRAVENOUS | Status: DC
  Administered 2024-01-22: 1200 [IU]/h via INTRAVENOUS
  Filled 2024-01-22: qty 250

## 2024-01-22 MED ORDER — HEPARIN SODIUM (PORCINE) 5000 UNIT/ML IJ SOLN
4000.0000 [IU] | Freq: Once | INTRAMUSCULAR | Status: AC
  Administered 2024-01-22: 4000 [IU] via INTRAVENOUS
  Filled 2024-01-22: qty 1

## 2024-01-22 MED ORDER — NITROGLYCERIN 2 % TD OINT
1.0000 [in_us] | TOPICAL_OINTMENT | Freq: Once | TRANSDERMAL | Status: AC
  Administered 2024-01-22: 1 [in_us] via TOPICAL
  Filled 2024-01-22: qty 1

## 2024-01-22 MED ORDER — NITROGLYCERIN 0.4 MG SL SUBL: 0.4000 mg | SUBLINGUAL_TABLET | SUBLINGUAL | Status: DC | PRN

## 2024-01-22 MED ORDER — LEVOTHYROXINE SODIUM 50 MCG PO TABS
75.0000 ug | ORAL_TABLET | Freq: Every day | ORAL | Status: DC
  Administered 2024-01-23: 75 ug via ORAL
  Filled 2024-01-22 (×2): qty 1

## 2024-01-22 MED ORDER — IOHEXOL 350 MG/ML SOLN
100.0000 mL | Freq: Once | INTRAVENOUS | Status: AC | PRN
  Administered 2024-01-22: 100 mL via INTRAVENOUS

## 2024-01-22 MED ORDER — ACETAMINOPHEN 325 MG PO TABS: 650.0000 mg | ORAL_TABLET | ORAL | Status: DC | PRN

## 2024-01-22 MED ORDER — ONDANSETRON HCL 4 MG/2ML IJ SOLN
4.0000 mg | Freq: Once | INTRAMUSCULAR | Status: AC
  Administered 2024-01-22: 4 mg via INTRAVENOUS
  Filled 2024-01-22: qty 2

## 2024-01-22 MED ORDER — ALBUTEROL SULFATE (2.5 MG/3ML) 0.083% IN NEBU: 2.5000 mg | INHALATION_SOLUTION | RESPIRATORY_TRACT | Status: DC | PRN

## 2024-01-22 MED ORDER — MORPHINE SULFATE (PF) 2 MG/ML IV SOLN: 2.0000 mg | INTRAVENOUS | Status: DC | PRN

## 2024-01-22 MED ORDER — MORPHINE SULFATE (PF) 4 MG/ML IV SOLN
4.0000 mg | Freq: Once | INTRAVENOUS | Status: AC
  Administered 2024-01-22: 4 mg via INTRAVENOUS
  Filled 2024-01-22: qty 1

## 2024-01-22 MED ORDER — ONDANSETRON HCL 4 MG/2ML IJ SOLN: 4.0000 mg | Freq: Four times a day (QID) | INTRAMUSCULAR | Status: DC | PRN

## 2024-01-22 MED ORDER — ASPIRIN 81 MG PO TBEC
81.0000 mg | DELAYED_RELEASE_TABLET | Freq: Every day | ORAL | Status: DC
Start: 2024-01-23 — End: 2024-01-22

## 2024-01-22 MED ORDER — ACETAMINOPHEN 650 MG RE SUPP: 650.0000 mg | Freq: Four times a day (QID) | RECTAL | Status: DC | PRN

## 2024-01-22 MED ORDER — LEVOTHYROXINE SODIUM 50 MCG PO TABS: 50.0000 ug | ORAL_TABLET | Freq: Every day | ORAL | Status: DC

## 2024-01-22 MED ORDER — AMLODIPINE BESYLATE 5 MG PO TABS
2.5000 mg | ORAL_TABLET | Freq: Every day | ORAL | Status: DC
  Administered 2024-01-23: 2.5 mg via ORAL
  Filled 2024-01-22: qty 1

## 2024-01-22 MED ORDER — LOSARTAN POTASSIUM 50 MG PO TABS
50.0000 mg | ORAL_TABLET | Freq: Every day | ORAL | Status: DC
  Administered 2024-01-23: 50 mg via ORAL
  Filled 2024-01-22: qty 1

## 2024-01-22 MED ORDER — METOPROLOL TARTRATE 25 MG PO TABS
25.0000 mg | ORAL_TABLET | Freq: Once | ORAL | Status: AC
  Administered 2024-01-22: 25 mg via ORAL
  Filled 2024-01-22: qty 1

## 2024-01-22 MED ORDER — ATORVASTATIN CALCIUM 20 MG PO TABS: 80.0000 mg | ORAL_TABLET | Freq: Every day | ORAL | Status: DC

## 2024-01-22 MED ORDER — INSULIN ASPART 100 UNIT/ML IJ SOLN
0.0000 [IU] | INTRAMUSCULAR | Status: DC
  Administered 2024-01-23: 8 [IU] via SUBCUTANEOUS
  Administered 2024-01-23: 3 [IU] via SUBCUTANEOUS
  Filled 2024-01-22 (×2): qty 1

## 2024-01-22 MED ORDER — ACETAMINOPHEN 325 MG PO TABS: 650.0000 mg | ORAL_TABLET | Freq: Four times a day (QID) | ORAL | Status: DC | PRN

## 2024-01-22 MED ORDER — ASPIRIN 81 MG PO TBEC
81.0000 mg | DELAYED_RELEASE_TABLET | Freq: Every day | ORAL | Status: DC
  Administered 2024-01-23: 81 mg via ORAL
  Filled 2024-01-22: qty 1

## 2024-01-22 NOTE — Assessment & Plan Note (Signed)
 Blood glucose over 300 Sliding scale insulin coverage Follow A1c

## 2024-01-22 NOTE — ED Notes (Signed)
 Heparin bolus given SQ. Pharmacy notified.

## 2024-01-22 NOTE — Progress Notes (Signed)
 RN called to notify pharmacy that IV heparin bolus of 4000 units was accidentally given subcutaneously. RN will plan to document error on MAR. New order for IV heparin bolus of 4000 units x1 entered.  Thank you for involving pharmacy in this patient's care.   Rockwell Alexandria, PharmD Clinical Pharmacist 01/22/2024 9:48 PM

## 2024-01-22 NOTE — ED Provider Notes (Signed)
 [facility] Provider Note  Patient Contact: 7:00 PM (approximate)   History   Chest Pain   HPI  Jason Mclean. is a 78 y.o. male who presents to the emergency department complaining of chest pain.  Patient states that he has a long cardiac history having had 6 stents in the past.  Over the last 2 to 3 days he has had worsening anginal type pain.  He states that he does note that he typically will feel slightly short of breath or have some chest pain with activity.  Yesterday he noted that he had increased chest pain from his baseline when walking up the stairs.  He took some aspirin, lay down and the pain went away.  Patient states that he woke up this morning had no symptoms, started performing some activity this afternoon and felt an increase in the chest pain.  He took 4 aspirin, started to rest and the pain is resolved.  Patient states that he does have a cardiologist and they have informed him if he started to develop complications with worsening angina or any problems with his stent he would likely need a CABG.  Patient currently is asymptomatic.  While he was having chest pain and shortness of breath he stated that the pain ran across his chest on both sides into his arms.  Again completely asymptomatic now after aspirin.     Physical Exam   Triage Vital Signs: ED Triage Vitals  Encounter Vitals Group     BP 01/22/24 1707 (!) 192/72     Systolic BP Percentile --      Diastolic BP Percentile --      Pulse Rate 01/22/24 1707 80     Resp 01/22/24 1707 18     Temp 01/22/24 1707 97.7 F (36.5 C)     Temp Source 01/22/24 1707 Oral     SpO2 01/22/24 1707 99 %     Weight 01/22/24 1706 222 lb (100.7 kg)     Height 01/22/24 1706 5\' 9"  (1.753 m)     Head Circumference --      Peak Flow --      Pain Score 01/22/24 1705 4     Pain Loc --      Pain Education --      Exclude from Growth Chart --     Most recent vital signs: Vitals:   01/22/24 2131  01/22/24 2200  BP: (!) 194/91 (!) 179/96  Pulse: 73 65  Resp:  (!) 22  Temp:    SpO2:  98%     General: Alert and in no acute distress.   Cardiovascular:  Good peripheral perfusion.  No significant appreciable murmurs rubs or gallops. Respiratory: Normal respiratory effort without tachypnea or retractions. Lungs CTAB. Good air entry to the bases with no decreased or absent breath sounds Gastrointestinal: Bowel sounds 4 quadrants. Soft and nontender to palpation. No guarding or rigidity. No palpable masses. No distention. No CVA tenderness. Musculoskeletal: Full range of motion to all extremities.  Neurologic:  No gross focal neurologic deficits are appreciated.  Skin:   No rash noted Other:   ED Results / Procedures / Treatments   Labs (all labs ordered are listed, but only abnormal results are displayed) Labs Reviewed  BASIC METABOLIC PANEL - Abnormal; Notable for the following components:      Result Value   Sodium 131 (*)    Chloride 97 (*)    Glucose, Bld 339 (*)  All other components within normal limits  TROPONIN I (HIGH SENSITIVITY) - Abnormal; Notable for the following components:   Troponin I (High Sensitivity) 63 (*)    All other components within normal limits  CBC  PROTIME-INR  HEPARIN LEVEL (UNFRACTIONATED)  CBC  HEMOGLOBIN A1C  LIPOPROTEIN A (LPA)  TROPONIN I (HIGH SENSITIVITY)     EKG  ED ECG REPORT I, Delorise Royals Dorian Duval,  personally viewed and interpreted this ECG.   Date: 01/22/2024  EKG Time: 1705 hrs.  Rate: 74 bpm  Rhythm: unchanged from previous tracings, normal sinus rhythm  Axis: Normal axis  Intervals:none  ST&T Change: Slight depression in the lateral leads consistent with previous EKG from 2021 otherwise no ST elevations or depressions noted  Normal sinus rhythm.  No evidence of STEMI.  Patient with chronic changes concerning for anterior infarct unchanged from previous EKG.  Patient has chronic depression in the lateral leads  also on change from previous EKGs.    RADIOLOGY  I personally viewed, evaluated, and interpreted these images as part of my medical decision making, as well as reviewing the written report by the radiologist.  ED Provider Interpretation: No acute cardiopulmonary abnormality on chest x-ray.  CT scan of the chest revealed no concerning findings.  CT Angio Chest/Abd/Pel for Dissection W and/or Wo Contrast Result Date: 01/22/2024 CLINICAL DATA:  Acute aortic syndrome suspected. Sharp pain radiating from chest to back for 2 days. EXAM: CT ANGIOGRAPHY CHEST, ABDOMEN AND PELVIS TECHNIQUE: Non-contrast CT of the chest was initially obtained. Multidetector CT imaging through the chest, abdomen and pelvis was performed using the standard protocol during bolus administration of intravenous contrast. Multiplanar reconstructed images and MIPs were obtained and reviewed to evaluate the vascular anatomy. RADIATION DOSE REDUCTION: This exam was performed according to the departmental dose-optimization program which includes automated exposure control, adjustment of the mA and/or kV according to patient size and/or use of iterative reconstruction technique. CONTRAST:  OMNIPAQUE IOHEXOL 350 MG/ML SOLN COMPARISON:  est radiograph earlier today and CT abdomen and pelvis 11/20/2010 FINDINGS: CTA CHEST FINDINGS Cardiovascular: No acute aortic syndrome in the chest. Aortic atherosclerotic calcification. Normal heart size. No pericardial effusion. Coronary artery calcification. No pulmonary embolism. Mediastinum/Nodes: Trachea and esophagus are unremarkable. No thoracic adenopathy. Lungs/Pleura: Centrilobular and paraseptal emphysema greatest in the upper lobes. Reticular and ground-glass opacities with a peripheral and lower lung predominance. Suggestion of honeycombing in the posterior right lower lobe. Associated bronchiolectasis. No focal pneumonia. No pleural effusion or pneumothorax. Musculoskeletal: No acute  fracture. Review of the MIP images confirms the above findings. CTA ABDOMEN AND PELVIS FINDINGS VASCULAR Aorta: Scattered calcified atherosclerotic plaque. No hemodynamically significant stenosis. No aneurysm or dissection. Celiac: Scattered calcified atherosclerotic plaque causing moderate narrowing at the origin. No aneurysm or dissection. SMA: Scattered calcified atherosclerotic plaque. No aneurysm or dissection. No hemodynamically significant stenosis. Renals: Calcified atherosclerotic plaque causes mild narrowing of both renal arteries. No aneurysm or dissection. IMA: Patent. Inflow: Calcified atherosclerotic plaque causes up to mild narrowing. No aneurysm or dissection. Veins: No obvious venous abnormality within the limitations of this arterial phase study. Review of the MIP images confirms the above findings. NON-VASCULAR Hepatobiliary: No acute abnormality. Pancreas: Unremarkable. Spleen: Unremarkable. Adrenals/Urinary Tract: Normal adrenal glands. No urinary calculi or hydronephrosis. Unremarkable bladder. Stomach/Bowel: No bowel obstruction or bowel wall thickening. Stomach and appendix are within normal limits. Lymphatic: No lymphadenopathy. Reproductive: No acute abnormality.  Prosthetic right testicle. Other: No free intraperitoneal fluid or air. Musculoskeletal: No acute fracture. Review of  the MIP images confirms the above findings. IMPRESSION: 1. No acute aortic syndrome. 2. No acute abnormality in the chest, abdomen, or pelvis. 3. Emphysema and interstitial lung disease. Aortic Atherosclerosis (ICD10-I70.0) and Emphysema (ICD10-J43.9). Electronically Signed   By: Minerva Fester M.D.   On: 01/22/2024 21:07   DG Chest 2 View Result Date: 01/22/2024 CLINICAL DATA:  Chest pain since yesterday, bilateral arm pain EXAM: CHEST - 2 VIEW COMPARISON:  06/15/2018 FINDINGS: Frontal and lateral views of the chest demonstrate an unremarkable cardiac silhouette. Stable eventration of the right hemidiaphragm.  No acute airspace disease, effusion, or pneumothorax. No acute bony abnormalities. IMPRESSION: 1. No acute intrathoracic process. Electronically Signed   By: Sharlet Salina M.D.   On: 01/22/2024 17:49    PROCEDURES:  Critical Care performed: No  Procedures   MEDICATIONS ORDERED IN ED: Medications  heparin ADULT infusion 100 units/mL (25000 units/256mL) (1,200 Units/hr Intravenous New Bag/Given 01/22/24 2144)  aspirin EC tablet 81 mg (has no administration in time range)  amLODipine (NORVASC) tablet 2.5 mg (has no administration in time range)  atorvastatin (LIPITOR) tablet 80 mg (has no administration in time range)  losartan (COZAAR) tablet 50 mg (has no administration in time range)  metoprolol succinate (TOPROL-XL) 24 hr tablet 50 mg (has no administration in time range)  nitroGLYCERIN (NITROSTAT) SL tablet 0.4 mg (has no administration in time range)  ondansetron (ZOFRAN) injection 4 mg (has no administration in time range)  insulin aspart (novoLOG) injection 0-15 Units (has no administration in time range)  acetaminophen (TYLENOL) tablet 650 mg (has no administration in time range)    Or  acetaminophen (TYLENOL) suppository 650 mg (has no administration in time range)  morphine (PF) 2 MG/ML injection 2 mg (has no administration in time range)  albuterol (PROVENTIL) (2.5 MG/3ML) 0.083% nebulizer solution 2.5 mg (has no administration in time range)  levothyroxine (SYNTHROID) tablet 75 mcg (has no administration in time range)  iohexol (OMNIPAQUE) 350 MG/ML injection 100 mL (100 mLs Intravenous Contrast Given 01/22/24 2040)  morphine (PF) 4 MG/ML injection 4 mg (4 mg Intravenous Given 01/22/24 2105)  ondansetron (ZOFRAN) injection 4 mg (4 mg Intravenous Given 01/22/24 2104)  metoprolol tartrate (LOPRESSOR) tablet 25 mg (25 mg Oral Given 01/22/24 2131)  nitroGLYCERIN (NITROGLYN) 2 % ointment 1 inch (1 inch Topical Given 01/22/24 2135)  heparin injection 4,000 Units (4,000 Units Intravenous  Given 01/22/24 2132)  heparin bolus via infusion 4,000 Units (4,000 Units Intravenous Bolus from Bag 01/22/24 2208)     IMPRESSION / MDM / ASSESSMENT AND PLAN / ED COURSE  I reviewed the triage vital signs and the nursing notes.                                 Differential diagnosis includes, but is not limited to, ACS/STEMI/NSTEMI, stable angina, unstable angina  The patient is on the cardiac monitor to evaluate for evidence of arrhythmia and/or significant heart rate changes.  Patient's presentation is most consistent with acute presentation with potential threat to life or bodily function.   Patient's diagnosis is consistent with unstable angina.  Patient presents to the emergency department with increasing chest pain over the last month.  He states that most of these episodes have been exertional consistent with stable angina.  Patient does have a complicated cardiac history with multiple stents over the last 20 years.  Most recent stent placement was in 2021.  Patient is followed by  cardiology and does perform cardiac rehab.  Patient noticed over the last 2 days that he had had worsening pain that initially he thought was exertional but ultimately decided it was nonexertional.  Patient then presents to the ED initially was asymptomatic but developed crushing chest pain while here resting in bed.  Patient's pain improved with morphine.  Had already taken aspirin 324 mg prior to arrival.  Patient's initial troponin was 11, initial basic labs, chest x-ray were reassuring.  He was describing some of his chest pain as a sharp sensation running from his anterior chest to his back between his shoulder blades and ultimately down both arms.  CT angio was ordered with no evidence of dissection or other acute finding.  While laying in bed patient had a return of his chest pain.  In addition, his second troponin rose to 63 and returned at this time.  Discussed the patient with on-call cardiologist, Dr.  Juliann Pares who advises to start Nitropaste, give another dose of metoprolol 25 mg and proceed with heparinization..  Cardiology will follow patient while admitted.  I admitted to the hospitalist team at this time.    FINAL CLINICAL IMPRESSION(S) / ED DIAGNOSES   Final diagnoses:  Unstable angina (HCC)     Rx / DC Orders   ED Discharge Orders     None        Note:  This document was prepared using Dragon voice recognition software and may include unintentional dictation errors.   Lanette Hampshire 01/22/24 2327    Jene Every, MD 01/25/24 [phone]

## 2024-01-22 NOTE — ED Notes (Signed)
 CCMD called and pt placed on cardiac monitoring.

## 2024-01-22 NOTE — ED Triage Notes (Signed)
 Pt sts that he thinks he is having a heart attack. Pt sts that the pain started last evening and took an aspirin last night. Pt sts that he is having bilateral arm pain. Pt sts that he has taken 324mg  of aspirin in the last hr. Pt endorses multiple stents.

## 2024-01-22 NOTE — Assessment & Plan Note (Addendum)
 CAD with history of multiple stents Patient with typical chest pain  Currently on DAPT continue Nitropaste, with morphine for breakthrough Continue metoprolol, aspirin, atorvastatin Cardiology consulted Will keep n.p.o. for possible cath

## 2024-01-22 NOTE — Assessment & Plan Note (Signed)
 Not acutely exacerbated Continue home inhalers.  DuoNebs as needed

## 2024-01-22 NOTE — ED Notes (Signed)
 Pt able to eat per MD Kinner. Tuna sandwich provided.

## 2024-01-22 NOTE — H&P (Signed)
 History and Physical    Patient: Jason Mclean. ZOX:[id number] DOB: 22-Mar-1946 DOA: 01/22/2024 DOS: the patient was seen and examined on 01/22/2024 PCP: Barbette Reichmann, MD  Patient coming from: Home  Chief Complaint:  Chief Complaint  Patient presents with   Chest Pain    HPI: Crystian Frith. is a 78 y.o. male with medical history significant for CAD with multiple stents on DAPT, DM, HTN, HLD, COPD, last seen by his cardiologist on 12/27/23 when he was doing well who presents to the ED with typical chest pain that started the evening prior.  He took an aspirin however pain is now persistent and radiating to both arms. ED course and data review: BP 192/72 with otherwise normal vitals Workup notable for troponin 11-->63, EKG showing sinus at 70 with old anterior infarct but no acute ST-T wave changes. Other labs notable for blood glucose 339, sodium 131 CTA chest PE protocol showed no acute aortic syndrome and no acute finding While in the ED he had an episode of chest tightness that resolved with morphine.  Patient started on a heparin infusion placed on [facility] consulted for admission.  Patient is chest pain-free at the time of admission.  Review of Systems: As mentioned in the history of present illness. All other systems reviewed and are negative.  Past Medical History:  Diagnosis Date   Coronary artery disease    s/p three coronary stents   Diabetes mellitus without complication (HCC)    Hypercholesterolemia    Hypertension    Hypothyroidism    Testicular cancer [facility])    Past Surgical History:  Procedure Laterality Date   CATARACT EXTRACTION W/PHACO Left 01/27/2022   Procedure: CATARACT EXTRACTION PHACO AND INTRAOCULAR LENS PLACEMENT (IOC) LEFT DIABETIC 6.17 01:17.1;  Surgeon: Lockie Mola, MD;  Location: [facility];  Service: Ophthalmology;  Laterality: Left;  Diabetic   CATARACT EXTRACTION W/PHACO Right 02/10/2022   Procedure: CATARACT  EXTRACTION PHACO AND INTRAOCULAR LENS PLACEMENT (IOC) RIGHT DIABETIC 10.48 01:19.8;  Surgeon: Lockie Mola, MD;  Location: [facility];  Service: Ophthalmology;  Laterality: Right;  Diabetic   CORONARY ANGIOPLASTY WITH STENT PLACEMENT     CORONARY BALLOON ANGIOPLASTY N/A 01/29/2020   Procedure: CORONARY STENT INTERVENTION;  Surgeon: Marcina Millard, MD;  Location: [facility] INVASIVE CV LAB;  Service: Cardiovascular;  Laterality: N/A;   CORONARY STENT INTERVENTION N/A 06/16/2018   Procedure: CORONARY STENT INTERVENTION;  Surgeon: Marcina Millard, MD;  Location: [facility] INVAS[facility] CV LAB;  Service: Cardiovascular;  Laterality: N/A;   LEFT HEART CATH AND CORONARY ANGIOGRAPHY N/A 06/16/2018   Procedure: LEFT HEART CATH AND CORONARY ANGIOGRAPHY;  Surgeon: Marcina Millard, MD;  Location: [facility] INVASIVE CV LAB;  Service: Cardiovascular;  Laterality: N/A;   LEFT HEART CATH AND CORONARY ANGIOGRAPHY Left 01/29/2020   Procedure: LEFT HEART CATH AND CORONARY ANGIOGRAPHY;  Surgeon: Marcina Millard, MD;  Location: [facility] INVASIVE CV LAB;  Service: Cardiovascular;  Laterality: Left;   Social History:  reports that he quit smoking about 11 years ago. His smoking use included cigarettes. He has never used smokeless tobacco. He reports that he does not currently use alcohol. He reports that he does not use drugs.  No Known Allergies  Family History  Problem Relation Age of Onset   Strabismus Mother    Stroke Mother    CAD Father     Prior to Admission medications   Medication Sig Start Date End Date Taking? Authorizing Provider  amLODipine (NORVASC) 2.5 MG tablet Take 2.5  mg by mouth daily.    [provider]  aspirin EC 81 MG tablet Take 81 mg by mouth daily.     [provider]  atorvastatin (LIPITOR) 80 MG tablet Take 1 tablet (80 mg total) by mouth daily at 6 PM. 06/17/18   Auburn Bilberry, MD  Cholecalciferol (VITAMIN D3) 50 MCG (2000 UT) TABS Take 2,000 Units by mouth  daily.    [provider]  clopidogrel (PLAVIX) 75 MG tablet Take 1 tablet (75 mg total) by mouth daily with breakfast. 01/30/20   Paraschos, Alexander, MD  Coenzyme Q10 100 MG TABS Take 100 mg by mouth every other day.     [provider]  colchicine 0.6 MG tablet Take 1 tablet (0.6 mg total) by mouth 2 (two) times daily for 7 days. 06/17/18 02/10/22  Auburn Bilberry, MD  gabapentin (NEURONTIN) 100 MG capsule Take 100 mg by mouth at bedtime. 02/21/23   [provider]  glimepiride (AMARYL) 2 MG tablet Take 2 mg by mouth daily. 12/27/19   [provider]  isosorbide mononitrate (IMDUR) 30 MG 24 hr tablet Take 1 tablet (30 mg total) by mouth daily. 06/17/18   Auburn Bilberry, MD  levothyroxine (SYNTHROID, LEVOTHROID) 75 MCG tablet Take 75 mcg by mouth daily before breakfast.  03/28/18   [provider]  losartan (COZAAR) 50 MG tablet Take 50 mg by mouth daily. 05/29/18   [provider]  metFORMIN (GLUCOPHAGE) 1000 MG tablet Take 1,000 mg by mouth 2 (two) times daily with a meal. 05/16/18   [provider]  metoprolol succinate (TOPROL-XL) 50 MG 24 hr tablet Take 50 mg by mouth daily. 05/16/18   [provider]  Multiple Vitamin (MULTIVITAMIN WITH MINERALS) TABS tablet Take 1 tablet by mouth daily. Centrum    [provider]  Omega-3 Fatty Acids (SALMON OIL-1000 PO) Take 1,000 mg by mouth daily.    [provider]  RYBELSUS 3 MG TABS Take 1 tablet by mouth daily.    [provider]  traMADol (ULTRAM) 50 MG tablet Take 50-100 mg by mouth 2 (two) times daily as needed.    [provider]  TURMERIC PO Take by mouth.    [provider]    Physical Exam: Vitals:   01/22/24 1707 01/22/24 1944 01/22/24 2105 01/22/24 2131  BP: (!) 192/72 (!) 128/113 (!) 194/91 (!) 194/91  Pulse: 80 68 73 73  Resp: 18 20 (!) 23   Temp: 97.7 F (36.5 C)  97.7 F (36.5 C)   TempSrc: Oral  Oral   SpO2: 99% 100% 98%    Weight:      Height:       Physical Exam Vitals and nursing note reviewed.  Constitutional:      General: He is not in acute distress. HENT:     Head: Normocephalic and atraumatic.  Cardiovascular:     Rate and Rhythm: Normal rate and regular rhythm.     Heart sounds: Normal heart sounds.  Pulmonary:     Effort: Pulmonary effort is normal.     Breath sounds: Normal breath sounds.  Abdominal:     Palpations: Abdomen is soft.     Tenderness: There is no abdominal tenderness.  Neurological:     Mental Status: Mental status is at baseline.     Labs on Admission: I have personally reviewed following labs and imaging studies  CBC: Recent Labs  Lab 01/22/24 1709  WBC 6.8  HGB 13.3  HCT 39.9  MCV 90.9  PLT 299   Basic Metabolic Panel: Recent Labs  Lab 01/22/24 1709  NA 131*  K 4.4  CL 97*  CO2 23  GLUCOSE 339*  BUN 18  CREATININE 1.00  CALCIUM 9.1   GFR: Estimated Creatinine Clearance: 74.7 mL/min (by C-G formula based on SCr of 1 mg/dL). Liver Function Tests: No results for input(s): "AST", "ALT", "ALKPHOS", "BILITOT", "PROT", "ALBUMIN" in the last 168 hours. No results for input(s): "LIPASE", "AMYLASE" in the last 168 hours. No results for input(s): "AMMONIA" in the last 168 hours. Coagulation Profile: No results for input(s): "INR", "PROTIME" in the last 168 hours. Cardiac Enzymes: No results for input(s): "CKTOTAL", "CKMB", "CKMBINDEX", "TROPONINI" in the last 168 hours. BNP (last 3 results) No results for input(s): "PROBNP" in the last 8760 hours. HbA1C: No results for input(s): "HGBA1C" in the last 72 hours. CBG: No results for input(s): "GLUCAP" in the last 168 hours. Lipid Profile: No results for input(s): "CHOL", "HDL", "LDLCALC", "TRIG", "CHOLHDL", "LDLDIRECT" in the last 72 hours. Thyroid Function Tests: No results for input(s): "TSH", "T4TOTAL", "FREET4", "T3FREE", "THYROIDAB" in the last 72 hours. Anemia Panel: No results for input(s):  "VITAMINB12", "FOLATE", "FERRITIN", "TIBC", "IRON", "RETICCTPCT" in the last 72 hours. Urine analysis: No results found for: "COLORURINE", "APPEARANCEUR", "LABSPEC", "PHURINE", "GLUCOSEU", "HGBUR", "BILIRUBINUR", "KETONESUR", "PROTEINUR", "UROBILINOGEN", "NITRITE", "LEUKOCYTESUR"  Radiological Exams on Admission: CT Angio Chest/Abd/Pel for Dissection W and/or Wo Contrast Result Date: 01/22/2024 CLINICAL DATA:  Acute aortic syndrome suspected. Sharp pain radiating from chest to back for 2 days. EXAM: CT ANGIOGRAPHY CHEST, ABDOMEN AND PELVIS TECHNIQUE: Non-contrast CT of the chest was initially obtained. Multidetector CT imaging through the chest, abdomen and pelvis was performed using the standard protocol during bolus administration of intravenous contrast. Multiplanar reconstructed images and MIPs were obtained and reviewed to evaluate the vascular anatomy. RADIATION DOSE REDUCTION: This exam was performed according to the departmental dose-optimization program which includes automated exposure control, adjustment of the mA and/or kV according to patient size and/or use of iterative reconstruction technique. CONTRAST:  OMNIPAQUE IOHEXOL 350 MG/ML SOLN COMPARISON:  est radiograph earlier today and CT abdomen and pelvis 11/20/2010 FINDINGS: CTA CHEST FINDINGS Cardiovascular: No acute aortic syndrome in the chest. Aortic atherosclerotic calcification. Normal heart size. No pericardial effusion. Coronary artery calcification. No pulmonary embolism. Mediastinum/Nodes: Trachea and esophagus are unremarkable. No thoracic adenopathy. Lungs/Pleura: Centrilobular and paraseptal emphysema greatest in the upper lobes. Reticular and ground-glass opacities with a peripheral and lower lung predominance. Suggestion of honeycombing in the posterior right lower lobe. Associated bronchiolectasis. No focal pneumonia. No pleural effusion or pneumothorax. Musculoskeletal: No acute fracture. Review of the MIP images confirms  the above findings. CTA ABDOMEN AND PELVIS FINDINGS VASCULAR Aorta: Scattered calcified atherosclerotic plaque. No hemodynamically significant stenosis. No aneurysm or dissection. Celiac: Scattered calcified atherosclerotic plaque causing moderate narrowing at the origin. No aneurysm or dissection. SMA: Scattered calcified atherosclerotic plaque. No aneurysm or dissection. No hemodynamically significant stenosis. Renals: Calcified atherosclerotic plaque causes mild narrowing of both renal arteries. No aneurysm or dissection. IMA: Patent. Inflow: Calcified atherosclerotic plaque causes up to mild narrowing. No aneurysm or dissection. Veins: No obvious venous abnormality within the limitations of this arterial phase study. Review of the MIP images confirms the above findings. NON-VASCULAR Hepatobiliary: No acute abnormality. Pancreas: Unremarkable. Spleen: Unremarkable. Adrenals/Urinary Tract: Normal adrenal glands. No urinary calculi or hydronephrosis. Unremarkable bladder. Stomach/Bowel: No bowel obstruction or bowel wall thickening. Stomach and appendix are within normal limits. Lymphatic: No lymphadenopathy. Reproductive: No acute  abnormality.  Prosthetic right testicle. Other: No free intraperitoneal fluid or air. Musculoskeletal: No acute fracture. Review of the MIP images confirms the above findings. IMPRESSION: 1. No acute aortic syndrome. 2. No acute abnormality in the chest, abdomen, or pelvis. 3. Emphysema and interstitial lung disease. Aortic Atherosclerosis (ICD10-I70.0) and Emphysema (ICD10-J43.9). Electronically Signed   By: Minerva Fester M.D.   On: 01/22/2024 21:07   DG Chest 2 View Result Date: 01/22/2024 CLINICAL DATA:  Chest pain since yesterday, bilateral arm pain EXAM: CHEST - 2 VIEW COMPARISON:  06/15/2018 FINDINGS: Frontal and lateral views of the chest demonstrate an unremarkable cardiac silhouette. Stable eventration of the right hemidiaphragm. No acute airspace disease, effusion, or  pneumothorax. No acute bony abnormalities. IMPRESSION: 1. No acute intrathoracic process. Electronically Signed   By: Sharlet Salina M.D.   On: 01/22/2024 17:49     Data Reviewed: Relevant notes from primary care and specialist visits, past discharge summaries as available in EHR, including Care Everywhere. Prior diagnostic testing as pertinent to current admission diagnoses Updated medications and problem lists for reconciliation ED course, including vitals, labs, imaging, treatment and response to treatment Triage notes, nursing and pharmacy notes and ED provider's notes Notable results as noted in HPI   Assessment and Plan: * Unstable angina (HCC) CAD with history of multiple stents Patient with typical chest pain  Currently on DAPT continue Nitropaste, with morphine for breakthrough Continue metoprolol, aspirin, atorvastatin Cardiology consulted Will keep n.p.o. for possible cath  Hypertensive urgency BP 192/72 on arrival, improving Continue home losartan, metoprolol and amlodipine  Uncontrolled type 2 diabetes mellitus with hyperglycemia, without long-term current use of insulin (HCC) Blood glucose over 300 Sliding scale insulin coverage Follow A1c  Chronic obstructive pulmonary disease (COPD) (HCC) Not acutely exacerbated Continue home inhalers DuoNebs as needed        DVT prophylaxis: Heparin infusion  Consults: [facility] cardiology  Advance Care Planning:   Code Status: Prior   Family Communication: none  Disposition Plan: Back to previous home environment  Severity of Illness: The appropriate patient status for this patient is OBSERVATION. Observation status is judged to be reasonable and necessary in order to provide the required intensity of service to ensure the patient's safety. The patient's presenting symptoms, physical exam findings, and initial radiographic and laboratory data in the context of their medical condition is felt to place them at decreased  risk for further clinical deterioration. Furthermore, it is anticipated that the patient will be medically stable for discharge from the hospital within 2 midnights of admission.   Author: Andris Baumann, MD 01/22/2024 10:09 PM  For on call review [email or web address].

## 2024-01-22 NOTE — Assessment & Plan Note (Signed)
 BP 192/72 on arrival, improving Continue home losartan, metoprolol and amlodipine

## 2024-01-22 NOTE — Progress Notes (Signed)
 PHARMACY - ANTICOAGULATION CONSULT NOTE  Pharmacy Consult for heparin Indication: chest pain/ACS  No Known Allergies  Patient Measurements: Height: 5\' 9"  (175.3 cm) Weight: 100.7 kg (222 lb) IBW/kg (Calculated) : 70.7 Heparin Dosing Weight: 92.1 kg  Vital Signs: Temp: 97.7 F (36.5 C) (03/02 2105) Temp Source: Oral (03/02 2105) BP: 194/91 (03/02 2105) Pulse Rate: 73 (03/02 2105)  Labs: Recent Labs    01/22/24 1709 01/22/24 1943  HGB 13.3  --   HCT 39.9  --   PLT 299  --   CREATININE 1.00  --   TROPONINIHS 11 63*    Estimated Creatinine Clearance: 74.7 mL/min (by C-G formula based on SCr of 1 mg/dL).   Medical History: Past Medical History:  Diagnosis Date   Coronary artery disease    s/p three coronary stents   Diabetes mellitus without complication (HCC)    Hypercholesterolemia    Hypertension    Hypothyroidism    Testicular cancer [facility])    Assessment: 78 y/o male presenting with bilateral arm pain. Pharmacy has been consulted to initiate heparin infusion. Per chart review, patient is not on anticoagulation prior to admission.  Baseline labs: hgb 13.3, plt 299, INR pending  Goal of Therapy:  Heparin level 0.3-0.7 units/ml Monitor platelets by anticoagulation protocol: Yes   Plan:  Give 4000 units bolus x 1 Start heparin infusion at 1200 units/hr Check anti-Xa level in 8 hours and daily while on heparin Continue to monitor H&H and platelets  Thank you for involving pharmacy in this patient's care.   Rockwell Alexandria, PharmD Clinical Pharmacist 01/22/2024 9:24 PM

## 2024-01-23 DIAGNOSIS — J449 Chronic obstructive pulmonary disease, unspecified: Secondary | ICD-10-CM

## 2024-01-23 DIAGNOSIS — Z9861 Coronary angioplasty status: Secondary | ICD-10-CM | POA: Diagnosis not present

## 2024-01-23 DIAGNOSIS — E1165 Type 2 diabetes mellitus with hyperglycemia: Secondary | ICD-10-CM

## 2024-01-23 DIAGNOSIS — I2510 Atherosclerotic heart disease of native coronary artery without angina pectoris: Secondary | ICD-10-CM | POA: Diagnosis not present

## 2024-01-23 DIAGNOSIS — I10 Essential (primary) hypertension: Secondary | ICD-10-CM | POA: Diagnosis not present

## 2024-01-23 DIAGNOSIS — I160 Hypertensive urgency: Secondary | ICD-10-CM

## 2024-01-23 LAB — CBC
HCT: 36.2 % — ABNORMAL LOW (ref 39.0–52.0)
Hemoglobin: 12.2 g/dL — ABNORMAL LOW (ref 13.0–17.0)
MCH: 30 pg (ref 26.0–34.0)
MCHC: 33.7 g/dL (ref 30.0–36.0)
MCV: 89.2 fL (ref 80.0–100.0)
Platelets: 258 10*3/uL (ref 150–400)
RBC: 4.06 MIL/uL — ABNORMAL LOW (ref 4.22–5.81)
RDW: 13.2 % (ref 11.5–15.5)
WBC: 7 10*3/uL (ref 4.0–10.5)
nRBC: 0 % (ref 0.0–0.2)

## 2024-01-23 LAB — CBG MONITORING, ED
Glucose-Capillary: 116 mg/dL — ABNORMAL HIGH (ref 70–99)
Glucose-Capillary: 154 mg/dL — ABNORMAL HIGH (ref 70–99)
Glucose-Capillary: 251 mg/dL — ABNORMAL HIGH (ref 70–99)

## 2024-01-23 LAB — HEPARIN LEVEL (UNFRACTIONATED): Heparin Unfractionated: 0.59 [IU]/mL (ref 0.30–0.70)

## 2024-01-23 LAB — HEMOGLOBIN A1C
Hgb A1c MFr Bld: 8.3 % — ABNORMAL HIGH (ref 4.8–5.6)
Mean Plasma Glucose: 191.51 mg/dL

## 2024-01-23 MED ORDER — METOPROLOL SUCCINATE ER 50 MG PO TB24: 50.0000 mg | ORAL_TABLET | Freq: Every day | ORAL | 2 refills | Status: DC

## 2024-01-23 MED ORDER — NITROGLYCERIN 0.4 MG SL SUBL: 0.4000 mg | SUBLINGUAL_TABLET | SUBLINGUAL | 12 refills | Status: DC | PRN

## 2024-01-23 MED ORDER — ISOSORBIDE MONONITRATE ER 30 MG PO TB24: 30.0000 mg | ORAL_TABLET | Freq: Every day | ORAL | 2 refills | Status: DC

## 2024-01-23 MED ORDER — ISOSORBIDE MONONITRATE ER 60 MG PO TB24
30.0000 mg | ORAL_TABLET | Freq: Every day | ORAL | Status: DC
  Administered 2024-01-23: 30 mg via ORAL
  Filled 2024-01-23: qty 1

## 2024-01-23 NOTE — ED Notes (Signed)
Cardiology PA at bedside. 

## 2024-01-23 NOTE — Consult Note (Cosign Needed Addendum)
 [facility] CLINIC CARDIOLOGY CONSULT NOTE       Patient ID: Jason Mclean. MRN: [id number] DOB/AGE: Aug 05, 1946 78 y.o.  Admit date: 01/22/2024 Referring Physician Gala Romney, PA Primary Physician Barbette Reichmann, MD  Primary Cardiologist Dr. Darrold Junker Reason for Consultation chest pain  HPI: Jason Genis. is a 78 y.o. male  with a past medical history of coronary artery disease s/p stent to ramus 1999, stent to mid and distal RCA 2005, stenting to in-stent restenosis of RCA in 2019 and 2021, hypertension, hyperlipidemia, type 2 diabetes, COPD who presented to the ED on 01/22/2024 for chest pain. Cardiology was consulted for further evaluation.   Patient reports that on Saturday night he began having chest discomfort.  States that he took an aspirin and his pain eased off.  On Sunday he reports that he had recurrence and pain continued to worsen.  States that he decided to come in for evaluation at this time given his symptoms.  Workup in the ED notable for creatinine 1.0, potassium 4.4, sodium 131, hemoglobin 13.3, WBC 6.8.  Troponins trended 11 > 63.  EKG revealed normal sinus rhythm, rate 73 bpm with no acute ST-T changes.  CTA chest without acute abnormality.  At the time of my evaluation this morning patient is resting comfortably in ED stretcher.  He states that his pain has resolved at this time.  Feels this is related to the morphine he received.  Overall he is feeling much better and wishes to go home.  We discussed his symptoms in further detail.  He states he did had intermittent episodes of chest pain recently which has been an ongoing issue for many months.  He reports that he was recently sick with cough and congestion but was not tested for COVID or the flu.  He denies any recent fever or chills.  Overall he states that he is feeling back to his baseline and is without complaint.  Review of systems complete and found to be negative unless listed above    Past Medical  History:  Diagnosis Date   Coronary artery disease    s/p three coronary stents   Diabetes mellitus without complication (HCC)    Hypercholesterolemia    Hypertension    Hypothyroidism    Testicular cancer [facility])     Past Surgical History:  Procedure Laterality Date   CATARACT EXTRACTION W/PHACO Left 01/27/2022   Procedure: CATARACT EXTRACTION PHACO AND INTRAOCULAR LENS PLACEMENT (IOC) LEFT DIABETIC 6.17 01:17.1;  Surgeon: Lockie Mola, MD;  Location: [facility];  Service: Ophthalmology;  Laterality: Left;  Diabetic   CATARACT EXTRACTION W/PHACO Right 02/10/2022   Procedure: CATARACT EXTRACTION PHACO AND INTRAOCULAR LENS PLACEMENT (IOC) RIGHT DIABETIC 10.48 01:19.8;  Surgeon: Lockie Mola, MD;  Location: [facility];  Service: Ophthalmology;  Laterality: Right;  Diabetic   CORONARY ANGIOPLASTY WITH STENT PLACEMENT     CORONARY BALLOON ANGIOPLASTY N/A 01/29/2020   Procedure: CORONARY STENT INTERVENTION;  Surgeon: Marcina Millard, MD;  Location: [facility] INVASIVE CV LAB;  Service: Cardiovascular;  Laterality: N/A;   CORONARY STENT INTERVENTION N/A 06/16/2018   Procedure: CORONARY STENT INTERVENTION;  Surgeon: Marcina Millard, MD;  Location: [facility] INVASIVE CV LAB;  Service: Cardiovascular;  Laterality: N/A;   LEFT HEART CATH AND CORONARY ANGIOGRAPHY N/A 06/16/2018   Procedure: LEFT HEART CATH AND CORONARY ANGIOGRAPHY;  Surgeon: Marcina Millard, MD;  Location: [facility] INVASIVE CV LAB;  Service: Cardiovascular;  Laterality: N/A;   LEFT HEART CATH AND CORONARY ANGIOGRAPHY Left 01/29/2020  Procedure: LEFT HEART CATH AND CORONARY ANGIOGRAPHY;  Surgeon: Marcina Millard, MD;  Location: [facility] INVASIVE CV LAB;  Service: Cardiovascular;  Laterality: Left;    (Not in a hospital admission)  Social History   Socioeconomic History   Marital status: Divorced    Spouse name: Not on file   Number of children: Not on file   Years of education: Not on file   Highest  education level: Not on file  Occupational History   Not on file  Tobacco Use   Smoking status: Former    Current packs/day: 0.00    Types: Cigarettes    Quit date: 2014    Years since quitting: 11.1   Smokeless tobacco: Never  Vaping Use   Vaping status: Never Used  Substance and Sexual Activity   Alcohol use: Not Currently   Drug use: Never   Sexual activity: Not on file  Other Topics Concern   Not on file  Social History Narrative   Not on file   Social Drivers of Health   Financial Resource Strain: Low Risk  (11/24/2023)   Received from [facility]   Overall Financial Resource Strain (CARDIA)    Difficulty of Paying Living Expenses: Not hard at all  Food Insecurity: No Food Insecurity (11/24/2023)   Received from [facility]   Hunger Vital Sign    Worried About Running Out of Food in the Last Year: Never true    Ran Out of Food in the Last Year: Never true  Transportation Needs: No Transportation Needs (11/24/2023)   Received from [facility] - Transportation    In the past 12 months, has lack of transportation kept you from medical appointments or from getting medications?: No    Lack of Transportation (Non-Medical): No  Physical Activity: Not on file  Stress: Not on file  Social Connections: Not on file  Intimate Partner Violence: Not on file    Family History  Problem Relation Age of Onset   Strabismus Mother    Stroke Mother    CAD Father      Vitals:   01/23/24 0200 01/23/24 0332 01/23/24 0406 01/23/24 0630  BP: (!) 163/53 (!) 154/61 (!) 161/66 (!) 159/74  Pulse: (!) 58 (!) 55 (!) 58 60  Resp: 18 15 20 18   Temp:      TempSrc:      SpO2: 95% 96% 97% 98%  Weight:      Height:        PHYSICAL EXAM General: Well appearing male, well nourished, in no acute distress. HEENT: Normocephalic and atraumatic. Neck: No JVD.  Lungs: Normal respiratory effort on room air. Clear bilaterally to  auscultation. No wheezes, crackles, rhonchi.  Heart: HRRR. Normal S1 and S2 without gallops or murmurs.  Abdomen: Non-distended appearing.  Msk: Normal strength and tone for age. Extremities: Warm and well perfused. No clubbing, cyanosis. No edema.  Neuro: Alert and oriented X 3. Psych: Answers questions appropriately.   Labs: Basic Metabolic Panel: Recent Labs    01/22/24 1709  NA 131*  K 4.4  CL 97*  CO2 23  GLUCOSE 339*  BUN 18  CREATININE 1.00  CALCIUM 9.1   Liver Function Tests: No results for input(s): "AST", "ALT", "ALKPHOS", "BILITOT", "PROT", "ALBUMIN" in the last 72 hours. No results for input(s): "LIPASE", "AMYLASE" in the last 72 hours. CBC: Recent Labs    01/22/24 1709 01/23/24 0458  WBC 6.8 7.0  HGB 13.3  12.2*  HCT 39.9 36.2*  MCV 90.9 89.2  PLT 299 258   Cardiac Enzymes: Recent Labs    01/22/24 1709 01/22/24 1943  TROPONINIHS 11 63*   BNP: No results for input(s): "BNP" in the last 72 hours. D-Dimer: No results for input(s): "DDIMER" in the last 72 hours. Hemoglobin A1C: Recent Labs    01/22/24 2223  HGBA1C 8.3*   Fasting Lipid Panel: No results for input(s): "CHOL", "HDL", "LDLCALC", "TRIG", "CHOLHDL", "LDLDIRECT" in the last 72 hours. Thyroid Function Tests: No results for input(s): "TSH", "T4TOTAL", "T3FREE", "THYROIDAB" in the last 72 hours.  Invalid input(s): "FREET3" Anemia Panel: No results for input(s): "VITAMINB12", "FOLATE", "FERRITIN", "TIBC", "IRON", "RETICCTPCT" in the last 72 hours.   Radiology: CT Angio Chest/Abd/Pel for Dissection W and/or Wo Contrast Result Date: 01/22/2024 CLINICAL DATA:  Acute aortic syndrome suspected. Sharp pain radiating from chest to back for 2 days. EXAM: CT ANGIOGRAPHY CHEST, ABDOMEN AND PELVIS TECHNIQUE: Non-contrast CT of the chest was initially obtained. Multidetector CT imaging through the chest, abdomen and pelvis was performed using the standard protocol during bolus administration of  intravenous contrast. Multiplanar reconstructed images and MIPs were obtained and reviewed to evaluate the vascular anatomy. RADIATION DOSE REDUCTION: This exam was performed according to the departmental dose-optimization program which includes automated exposure control, adjustment of the mA and/or kV according to patient size and/or use of iterative reconstruction technique. CONTRAST:  OMNIPAQUE IOHEXOL 350 MG/ML SOLN COMPARISON:  est radiograph earlier today and CT abdomen and pelvis 11/20/2010 FINDINGS: CTA CHEST FINDINGS Cardiovascular: No acute aortic syndrome in the chest. Aortic atherosclerotic calcification. Normal heart size. No pericardial effusion. Coronary artery calcification. No pulmonary embolism. Mediastinum/Nodes: Trachea and esophagus are unremarkable. No thoracic adenopathy. Lungs/Pleura: Centrilobular and paraseptal emphysema greatest in the upper lobes. Reticular and ground-glass opacities with a peripheral and lower lung predominance. Suggestion of honeycombing in the posterior right lower lobe. Associated bronchiolectasis. No focal pneumonia. No pleural effusion or pneumothorax. Musculoskeletal: No acute fracture. Review of the MIP images confirms the above findings. CTA ABDOMEN AND PELVIS FINDINGS VASCULAR Aorta: Scattered calcified atherosclerotic plaque. No hemodynamically significant stenosis. No aneurysm or dissection. Celiac: Scattered calcified atherosclerotic plaque causing moderate narrowing at the origin. No aneurysm or dissection. SMA: Scattered calcified atherosclerotic plaque. No aneurysm or dissection. No hemodynamically significant stenosis. Renals: Calcified atherosclerotic plaque causes mild narrowing of both renal arteries. No aneurysm or dissection. IMA: Patent. Inflow: Calcified atherosclerotic plaque causes up to mild narrowing. No aneurysm or dissection. Veins: No obvious venous abnormality within the limitations of this arterial phase study. Review of the MIP  images confirms the above findings. NON-VASCULAR Hepatobiliary: No acute abnormality. Pancreas: Unremarkable. Spleen: Unremarkable. Adrenals/Urinary Tract: Normal adrenal glands. No urinary calculi or hydronephrosis. Unremarkable bladder. Stomach/Bowel: No bowel obstruction or bowel wall thickening. Stomach and appendix are within normal limits. Lymphatic: No lymphadenopathy. Reproductive: No acute abnormality.  Prosthetic right testicle. Other: No free intraperitoneal fluid or air. Musculoskeletal: No acute fracture. Review of the MIP images confirms the above findings. IMPRESSION: 1. No acute aortic syndrome. 2. No acute abnormality in the chest, abdomen, or pelvis. 3. Emphysema and interstitial lung disease. Aortic Atherosclerosis (ICD10-I70.0) and Emphysema (ICD10-J43.9). Electronically Signed   By: Minerva Fester M.D.   On: 01/22/2024 21:07   DG Chest 2 View Result Date: 01/22/2024 CLINICAL DATA:  Chest pain since yesterday, bilateral arm pain EXAM: CHEST - 2 VIEW COMPARISON:  06/15/2018 FINDINGS: Frontal and lateral views of the chest demonstrate an unremarkable cardiac silhouette. Stable  eventration of the right hemidiaphragm. No acute airspace disease, effusion, or pneumothorax. No acute bony abnormalities. IMPRESSION: 1. No acute intrathoracic process. Electronically Signed   By: Sharlet Salina M.D.   On: 01/22/2024 17:49    TELEMETRY reviewed by me 01/23/2024: Sinus rhythm rate 60s  EKG reviewed by me: normal sinus rhythm, rate 73 bpm with no acute ST-T changes  Data reviewed by me 01/23/2024: last 24h vitals tele labs imaging I/O ED provider note, admission H&P  Principal Problem:   Unstable angina (HCC) Active Problems:   Uncontrolled type 2 diabetes mellitus with hyperglycemia, without long-term current use of insulin (HCC)   CAD S/P percutaneous coronary angioplasty   Chronic obstructive pulmonary disease (COPD) (HCC)   Hypertensive urgency    ASSESSMENT AND PLAN:  Jason Shakespeare. is  a 78 y.o. male  with a past medical history of coronary artery disease s/p stent to ramus 1999, stent to mid and distal RCA 2005, stenting to in-stent restenosis of RCA in 2019 and 2021, hypertension, hyperlipidemia, type 2 diabetes, COPD who presented to the ED on 01/22/2024 for chest pain. Cardiology was consulted for further evaluation.   # Chest pain # Coronary artery disease # Hypertension Patient presented with chest pain symptoms which have been worsening recently.  Troponin trended 11 > 63.  EKG was without acute abnormality.  He received IV morphine and his pain resolved.  He is now without any chest pain and is feeling well. -Start Imdur 30 mg daily.  Continue losartan 50 mg daily, metoprolol succinate 50 mg daily, amlodipine 2.5 mg daily. -Continue atorvastatin 80 mg daily, aspirin 81 mg daily. -Will plan to see patient in clinic for follow-up and assess symptoms in further detail.  Ok for discharge today from a cardiac perspective. Will arrange for follow up in clinic with Dr. Darrold Junker in 1-2 weeks.    This patient's plan of care was discussed and created with Dr. Juliann Pares and he is in agreement.  Signed: Gale Journey, PA-C  01/23/2024, 8:35 AM [facility] Cardiology

## 2024-01-23 NOTE — Progress Notes (Signed)
 PHARMACY - ANTICOAGULATION CONSULT NOTE  Pharmacy Consult for heparin Indication: chest pain/ACS  No Known Allergies  Patient Measurements: Height: 5\' 9"  (175.3 cm) Weight: 100.7 kg (222 lb) IBW/kg (Calculated) : 70.7 Heparin Dosing Weight: 92.1 kg  Vital Signs: Temp: 97.9 F (36.6 C) (03/03 0146) Temp Source: Oral (03/03 0146) BP: 161/66 (03/03 0406) Pulse Rate: 58 (03/03 0406)  Labs: Recent Labs    01/22/24 1709 01/22/24 1943 01/22/24 2146 01/23/24 0458  HGB 13.3  --   --  12.2*  HCT 39.9  --   --  36.2*  PLT 299  --   --  258  LABPROT  --   --  12.5  --   INR  --   --  0.9  --   HEPARINUNFRC  --   --   --  0.59  CREATININE 1.00  --   --   --   TROPONINIHS 11 63*  --   --     Estimated Creatinine Clearance: 74.7 mL/min (by C-G formula based on SCr of 1 mg/dL).   Medical History: Past Medical History:  Diagnosis Date   Coronary artery disease    s/p three coronary stents   Diabetes mellitus without complication (HCC)    Hypercholesterolemia    Hypertension    Hypothyroidism    Testicular cancer [facility])    Assessment: 78 y/o male presenting with bilateral arm pain. Pharmacy has been consulted to initiate heparin infusion. Per chart review, patient is not on anticoagulation prior to admission.  Baseline labs: hgb 13.3, plt 299, INR pending  Goal of Therapy:  Heparin level 0.3-0.7 units/ml Monitor platelets by anticoagulation protocol: Yes   Plan:  3/3:  HL @ 0458 = 0.59, therapeutic X 1  - Will continue pt on current rate and recheck HL in 8 hrs.  Thank you for involving pharmacy in this patient's care.   Gayleen Sholtz D Clinical Pharmacist 01/23/2024 6:06 AM

## 2024-01-23 NOTE — Discharge Summary (Signed)
 Physician Discharge Summary   Patient: Jason Mclean. MRN: [id number] DOB: Apr 04, 1946  Admit date:     01/22/2024  Discharge date: 01/23/24  Discharge Physician: Arnetha Courser   PCP: Barbette Reichmann, MD   Recommendations at discharge:   Please obtain CBC and BMP and follow-up Follow-up with cardiology Follow-up with primary care provider  Discharge Diagnoses: Principal Problem:   Unstable angina [facility]) Active Problems:   CAD S/P percutaneous coronary angioplasty   Hypertensive urgency   Uncontrolled type 2 diabetes mellitus with hyperglycemia, without long-term current use of insulin (HCC)   Chronic obstructive pulmonary disease (COPD) [facility])   Hospital Course: Taken from H&P.  [name]. is a 78 y.o. male with medical history significant for CAD with multiple stents on DAPT, DM, HTN, HLD, COPD, last seen by his cardiologist on 12/27/23 when he was doing well who presents to the ED with typical chest pain that started the evening prior.  He took an aspirin however pain is now persistent and radiating to both arms.   On presentation patient has elevated blood pressure at 192/72, labs pertinent for troponin 11>>63, EKG with NSR, old anterior infarct but no acute ST-T wave changes.  Blood glucose elevated at 339 with pseudohyponatremia CTA chest PE protocol showed no acute aortic syndrome and no acute finding While in the ED he had an episode of chest tightness that resolved with morphine.  Patient started on a heparin infusion placed on Nitropaste.  3/3: Mildly elevated blood pressure at 159/74, A1c of 8.3.  Cardiology saw him.  Patient was chest pain-free, he was started on Imdur and they cleared him for discharge with a close outpatient follow-up.  Patient will continue on current medications and need to have a close follow-up with his providers for further recommendations.  Assessment and Plan: * Unstable angina (HCC) CAD with history of multiple stents Chest pain  resolved.  Cardiology saw him and cleared for discharge, he will restart Imdur.  And they will follow-up as outpatient  Hypertensive urgency BP 192/72 on arrival, improving Continue home losartan, metoprolol and amlodipine  Uncontrolled type 2 diabetes mellitus with hyperglycemia, without long-term current use of insulin (HCC) Blood glucose over 300 Sliding scale insulin coverage Follow A1c  Chronic obstructive pulmonary disease (COPD) (HCC) Not acutely exacerbated Continue home inhalers DuoNebs as needed  Consultants: Cardiology Procedures performed: None Disposition: Home Diet recommendation:  Discharge Diet Orders (From admission, onward)     Start     Ordered   01/23/24 0000  Diet - low sodium heart healthy        01/23/24 1015           Cardiac and Carb modified diet DISCHARGE MEDICATION: Allergies as of 01/23/2024   No Known Allergies      Medication List     STOP taking these medications    Rybelsus 3 MG Tabs Generic drug: Semaglutide   Semaglutide 14 MG Tabs       TAKE these medications    amLODipine 5 MG tablet Commonly known as: NORVASC Take 5 mg by mouth daily.   aspirin EC 81 MG tablet Take 81 mg by mouth daily.   atorvastatin 80 MG tablet Commonly known as: LIPITOR Take 1 tablet (80 mg total) by mouth daily at 6 PM.   clopidogrel 75 MG tablet Commonly known as: PLAVIX Take 1 tablet (75 mg total) by mouth daily with breakfast. What changed: when to take this   Coenzyme Q10 100 MG Tabs  Take 100 mg by mouth every other day.   gabapentin 100 MG capsule Commonly known as: NEURONTIN Take 100 mg by mouth at bedtime.   glimepiride 4 MG tablet Commonly known as: AMARYL Take 4 mg by mouth daily with breakfast.   isosorbide mononitrate 30 MG 24 hr tablet Commonly known as: IMDUR Take 1 tablet (30 mg total) by mouth daily. Start taking on: January 24, 2024   levothyroxine 75 MCG tablet Commonly known as: SYNTHROID Take 75 mcg by mouth  daily before breakfast.   losartan 50 MG tablet Commonly known as: COZAAR Take 50 mg by mouth daily.   metFORMIN 1000 MG tablet Commonly known as: GLUCOPHAGE Take 1,000 mg by mouth at bedtime.   metoprolol succinate 50 MG 24 hr tablet Commonly known as: TOPROL-XL Take 1 tablet (50 mg total) by mouth daily. Take with or immediately following a meal. Start taking on: January 24, 2024 What changed:  additional instructions Another medication with the same name was removed. Continue taking this medication, and follow the directions you see here.   multivitamin with minerals Tabs tablet Take 1 tablet by mouth daily. Centrum   nitroGLYCERIN 0.4 MG SL tablet Commonly known as: NITROSTAT Place 1 tablet (0.4 mg total) under the tongue every 5 (five) minutes x 3 doses as needed for chest pain.   pantoprazole 40 MG tablet Commonly known as: PROTONIX Take 40 mg by mouth daily.   TURMERIC PO Take by mouth.   Vitamin D3 50 MCG (2000 UT) Tabs Take 2,000 Units by mouth daily.        Follow-up Information     Barbette Reichmann, MD. Schedule an appointment as soon as possible for a visit in 1 week(s).   Specialty: Internal Medicine Contact information: [address] [place] [address] [phone]         Marcina Millard, MD. Schedule an appointment as soon as possible for a visit in 1 week(s).   Specialty: Cardiology Contact information: [address] [facility] [place] [address] [phone]                Discharge Exam: Ceasar Mons Weights   01/22/24 1706  Weight: 100.7 kg   General.  Overweight gentleman, in no acute distress. Pulmonary.  Lungs clear bilaterally, normal respiratory effort. CV.  Regular rate and rhythm, no JVD, rub or murmur. Abdomen.  Soft, nontender, nondistended, BS positive. CNS.  Alert and oriented .  No focal neurologic deficit. Extremities.  No edema, no cyanosis, pulses  intact and symmetrical. Psychiatry.  Judgment and insight appears normal.   Condition at discharge: stable  The results of significant diagnostics from this hospitalization (including imaging, microbiology, ancillary and laboratory) are listed below for reference.   Imaging Studies: CT Angio Chest/Abd/Pel for Dissection W and/or Wo Contrast Result Date: 01/22/2024 CLINICAL DATA:  Acute aortic syndrome suspected. Sharp pain radiating from chest to back for 2 days. EXAM: CT ANGIOGRAPHY CHEST, ABDOMEN AND PELVIS TECHNIQUE: Non-contrast CT of the chest was initially obtained. Multidetector CT imaging through the chest, abdomen and pelvis was performed using the standard protocol during bolus administration of intravenous contrast. Multiplanar reconstructed images and MIPs were obtained and reviewed to evaluate the vascular anatomy. RADIATION DOSE REDUCTION: This exam was performed according to the departmental dose-optimization program which includes automated exposure control, adjustment of the mA and/or kV according to patient size and/or use of iterative reconstruction technique. CONTRAST:  OMNIPAQUE IOHEXOL 350 MG/ML SOLN COMPARISON:  est radiograph earlier today and CT abdomen and pelvis 11/20/2010 FINDINGS: CTA CHEST FINDINGS Cardiovascular: No acute aortic syndrome in the chest. Aortic atherosclerotic calcification. Normal heart size. No pericardial effusion. Coronary artery calcification. No pulmonary embolism. Mediastinum/Nodes: Trachea and esophagus are unremarkable. No thoracic adenopathy. Lungs/Pleura: Centrilobular and paraseptal emphysema greatest in the upper lobes. Reticular and ground-glass opacities with a peripheral and lower lung predominance. Suggestion of honeycombing in the posterior right lower lobe. Associated bronchiolectasis. No focal pneumonia. No pleural effusion or pneumothorax. Musculoskeletal: No acute fracture. Review of the MIP images confirms the above findings. CTA  ABDOMEN AND PELVIS FINDINGS VASCULAR Aorta: Scattered calcified atherosclerotic plaque. No hemodynamically significant stenosis. No aneurysm or dissection. Celiac: Scattered calcified atherosclerotic plaque causing moderate narrowing at the origin. No aneurysm or dissection. SMA: Scattered calcified atherosclerotic plaque. No aneurysm or dissection. No hemodynamically significant stenosis. Renals: Calcified atherosclerotic plaque causes mild narrowing of both renal arteries. No aneurysm or dissection. IMA: Patent. Inflow: Calcified atherosclerotic plaque causes up to mild narrowing. No aneurysm or dissection. Veins: No obvious venous abnormality within the limitations of this arterial phase study. Review of the MIP images confirms the above findings. NON-VASCULAR Hepatobiliary: No acute abnormality. Pancreas: Unremarkable. Spleen: Unremarkable. Adrenals/Urinary Tract: Normal adrenal glands. No urinary calculi or hydronephrosis. Unremarkable bladder. Stomach/Bowel: No bowel obstruction or bowel wall thickening. Stomach and appendix are within normal limits. Lymphatic: No lymphadenopathy. Reproductive: No acute abnormality.  Prosthetic right testicle. Other: No free intraperitoneal fluid or air. Musculoskeletal: No acute fracture. Review of the MIP images confirms the above findings. IMPRESSION: 1. No acute aortic syndrome. 2. No acute abnormality in the chest, abdomen, or pelvis. 3. Emphysema and interstitial lung disease. Aortic Atherosclerosis (ICD10-I70.0) and Emphysema (ICD10-J43.9). Electronically Signed   By: Minerva Fester M.D.   On: 01/22/2024 21:07   DG Chest 2 View Result Date: 01/22/2024 CLINICAL DATA:  Chest pain since yesterday, bilateral arm pain EXAM: CHEST - 2 VIEW COMPARISON:  06/15/2018 FINDINGS: Frontal and lateral views of the chest demonstrate an unremarkable cardiac silhouette. Stable eventration of the right hemidiaphragm. No acute airspace disease, effusion, or pneumothorax. No acute bony  abnormalities. IMPRESSION: 1. No acute intrathoracic process. Electronically Signed   By: Sharlet Salina M.D.   On: 01/22/2024 17:49    Microbiology: Results for orders placed or performed during the hospital encounter of 01/25/20  SARS CORONAVIRUS 2 (TAT 6-24 HRS) Nasopharyngeal Nasopharyngeal Swab     Status: None   Collection Time: 01/25/20  1:31 PM   Specimen: Nasopharyngeal Swab  Result Value Ref Range Status   SARS Coronavirus 2 NEGATIVE NEGATIVE Final    Comment: (NOTE) SARS-CoV-2 target nucleic acids are NOT DETECTED. The SARS-CoV-2 RNA is generally detectable in upper and lower respiratory specimens during the acute phase of infection. Negative results do not preclude SARS-CoV-2 infection, do not rule out co-infections with other pathogens, and should not be used as the sole basis for treatment or other patient management decisions. Negative results must be combined with clinical observations, patient history, and epidemiological information. The expected result is Negative. Fact Sheet for Patients: HairSlick.no Fact Sheet for Healthcare Providers: quierodirigir.com This test is not yet approved or cleared by the [place] FDA and  has been authorized for detection and/or diagnosis of SARS-CoV-2 by FDA under an Emergency Use Authorization (EUA). This EUA will remain  in effect (meaning this test can be used) for the duration of the COVID-19 declaration under Section 56 4(b)(1) of the Act, 21 U.S.C. section 360bbb-3(b)(1), unless the  authorization is terminated or revoked sooner. Performed at [facility], [address], [place], [place] [address]     Labs: CBC: Recent Labs  Lab 01/22/24 1709 01/23/24 0458  WBC 6.8 7.0  HGB 13.3 12.2*  HCT 39.9 36.2*  MCV 90.9 89.2  PLT 299 258   Basic Metabolic Panel: Recent Labs  Lab 01/22/24 1709  NA 131*  K 4.4  CL 97*  CO2 23  GLUCOSE 339*  BUN 18   CREATININE 1.00  CALCIUM 9.1   Liver Function Tests: No results for input(s): "AST", "ALT", "ALKPHOS", "BILITOT", "PROT", "ALBUMIN" in the last 168 hours. CBG: Recent Labs  Lab 01/23/24 0013 01/23/24 0352 01/23/24 0913  GLUCAP 251* 154* 116*    Discharge time spent: greater than 30 minutes.  This record has been created using Conservation officer, historic buildings. Errors have been sought and corrected,but may not always be located. Such creation errors do not reflect on the standard of care.   Signed: Arnetha Courser, MD [facility] 01/23/2024

## 2024-01-23 NOTE — ED Notes (Signed)
 Pt ambulated to restroom in hallway without assistance from staff. Pt tolerated activity well. Pt was reconnected to the VS monitor and bed is in the lowest, locked position with call bell in reach.

## 2024-01-23 NOTE — Hospital Course (Addendum)
 Taken from H&P.  Jason Mclean. is a 78 y.o. male with medical history significant for CAD with multiple stents on DAPT, DM, HTN, HLD, COPD, last seen by his cardiologist on 12/27/23 when he was doing well who presents to the ED with typical chest pain that started the evening prior.  He took an aspirin however pain is now persistent and radiating to both arms.   On presentation patient has elevated blood pressure at 192/72, labs pertinent for troponin 11>>63, EKG with NSR, old anterior infarct but no acute ST-T wave changes.  Blood glucose elevated at 339 with pseudohyponatremia CTA chest PE protocol showed no acute aortic syndrome and no acute finding While in the ED he had an episode of chest tightness that resolved with morphine.  Patient started on a heparin infusion placed on Nitropaste.  3/3: Mildly elevated blood pressure at 159/74, A1c of 8.3.  Cardiology saw him.  Patient was chest pain-free, he was started on Imdur and they cleared him for discharge with a close outpatient follow-up.  Patient will continue on current medications and need to have a close follow-up with his providers for further recommendations.

## 2024-01-24 LAB — LIPOPROTEIN A (LPA): Lipoprotein (a): 26.4 nmol/L (ref <75.0)

## 2024-02-10 DIAGNOSIS — I493 Ventricular premature depolarization: Secondary | ICD-10-CM | POA: Diagnosis not present

## 2024-02-10 DIAGNOSIS — Z955 Presence of coronary angioplasty implant and graft: Secondary | ICD-10-CM | POA: Diagnosis not present

## 2024-02-10 DIAGNOSIS — I10 Essential (primary) hypertension: Secondary | ICD-10-CM | POA: Diagnosis not present

## 2024-02-10 DIAGNOSIS — R079 Chest pain, unspecified: Secondary | ICD-10-CM | POA: Diagnosis not present

## 2024-02-10 DIAGNOSIS — E1165 Type 2 diabetes mellitus with hyperglycemia: Secondary | ICD-10-CM | POA: Diagnosis not present

## 2024-02-10 DIAGNOSIS — Z9889 Other specified postprocedural states: Secondary | ICD-10-CM | POA: Diagnosis not present

## 2024-02-10 DIAGNOSIS — J410 Simple chronic bronchitis: Secondary | ICD-10-CM | POA: Diagnosis not present

## 2024-02-10 DIAGNOSIS — I214 Non-ST elevation (NSTEMI) myocardial infarction: Secondary | ICD-10-CM | POA: Diagnosis not present

## 2024-02-23 DIAGNOSIS — I214 Non-ST elevation (NSTEMI) myocardial infarction: Secondary | ICD-10-CM | POA: Diagnosis not present

## 2024-02-23 DIAGNOSIS — I2510 Atherosclerotic heart disease of native coronary artery without angina pectoris: Secondary | ICD-10-CM | POA: Diagnosis not present

## 2024-02-23 DIAGNOSIS — R079 Chest pain, unspecified: Secondary | ICD-10-CM | POA: Diagnosis not present

## 2024-02-23 DIAGNOSIS — Z9889 Other specified postprocedural states: Secondary | ICD-10-CM | POA: Diagnosis not present

## 2024-02-23 DIAGNOSIS — Z955 Presence of coronary angioplasty implant and graft: Secondary | ICD-10-CM | POA: Diagnosis not present

## 2024-02-23 DIAGNOSIS — I10 Essential (primary) hypertension: Secondary | ICD-10-CM | POA: Diagnosis not present

## 2024-02-29 ENCOUNTER — Other Ambulatory Visit: Payer: Self-pay

## 2024-02-29 ENCOUNTER — Inpatient Hospital Stay (HOSPITAL_COMMUNITY)
Admit: 2024-02-29 | Discharge: 2024-03-03 | DRG: 315 | Disposition: A | Discharge status: Other Acute Inpatient Hospital | Source: Other Acute Inpatient Hospital | Attending: Cardiovascular Disease | Admitting: Cardiovascular Disease

## 2024-02-29 ENCOUNTER — Encounter (HOSPITAL_COMMUNITY): Payer: Self-pay

## 2024-02-29 ENCOUNTER — Encounter
Admission: AD | Disposition: A | Discharge status: Other Acute Inpatient Hospital | Payer: Self-pay | Source: Home / Self Care | Attending: Cardiology

## 2024-02-29 ENCOUNTER — Inpatient Hospital Stay
Admission: AD | Admit: 2024-02-29 | Discharge: 2024-02-29 | DRG: 287 | Disposition: A | Discharge status: Other Acute Inpatient Hospital | Attending: Cardiology | Admitting: Cardiology

## 2024-02-29 ENCOUNTER — Encounter: Payer: Self-pay | Admitting: Cardiology

## 2024-02-29 DIAGNOSIS — Z87891 Personal history of nicotine dependence: Secondary | ICD-10-CM

## 2024-02-29 DIAGNOSIS — Z79899 Other long term (current) drug therapy: Secondary | ICD-10-CM

## 2024-02-29 DIAGNOSIS — Z7984 Long term (current) use of oral hypoglycemic drugs: Secondary | ICD-10-CM | POA: Diagnosis not present

## 2024-02-29 DIAGNOSIS — E7800 Pure hypercholesterolemia, unspecified: Secondary | ICD-10-CM | POA: Diagnosis not present

## 2024-02-29 DIAGNOSIS — E871 Hypo-osmolality and hyponatremia: Secondary | ICD-10-CM | POA: Diagnosis not present

## 2024-02-29 DIAGNOSIS — I10 Essential (primary) hypertension: Secondary | ICD-10-CM | POA: Insufficient documentation

## 2024-02-29 DIAGNOSIS — E1165 Type 2 diabetes mellitus with hyperglycemia: Secondary | ICD-10-CM | POA: Diagnosis not present

## 2024-02-29 DIAGNOSIS — Y831 Surgical operation with implant of artificial internal device as the cause of abnormal reaction of the patient, or of later complication, without mention of misadventure at the time of the procedure: Secondary | ICD-10-CM | POA: Diagnosis present

## 2024-02-29 DIAGNOSIS — I25110 Atherosclerotic heart disease of native coronary artery with unstable angina pectoris: Secondary | ICD-10-CM | POA: Diagnosis not present

## 2024-02-29 DIAGNOSIS — Z7982 Long term (current) use of aspirin: Secondary | ICD-10-CM | POA: Diagnosis not present

## 2024-02-29 DIAGNOSIS — E039 Hypothyroidism, unspecified: Secondary | ICD-10-CM | POA: Diagnosis present

## 2024-02-29 DIAGNOSIS — Z01810 Encounter for preprocedural cardiovascular examination: Secondary | ICD-10-CM | POA: Diagnosis not present

## 2024-02-29 DIAGNOSIS — T82855A Stenosis of coronary artery stent, initial encounter: Principal | ICD-10-CM | POA: Diagnosis present

## 2024-02-29 DIAGNOSIS — R9430 Abnormal result of cardiovascular function study, unspecified: Secondary | ICD-10-CM | POA: Diagnosis present

## 2024-02-29 DIAGNOSIS — Z955 Presence of coronary angioplasty implant and graft: Secondary | ICD-10-CM

## 2024-02-29 DIAGNOSIS — Z79890 Hormone replacement therapy (postmenopausal): Secondary | ICD-10-CM

## 2024-02-29 DIAGNOSIS — Z7902 Long term (current) use of antithrombotics/antiplatelets: Secondary | ICD-10-CM

## 2024-02-29 DIAGNOSIS — E119 Type 2 diabetes mellitus without complications: Secondary | ICD-10-CM | POA: Diagnosis present

## 2024-02-29 DIAGNOSIS — Z951 Presence of aortocoronary bypass graft: Secondary | ICD-10-CM

## 2024-02-29 DIAGNOSIS — I97190 Other postprocedural cardiac functional disturbances following cardiac surgery: Secondary | ICD-10-CM | POA: Diagnosis not present

## 2024-02-29 DIAGNOSIS — Z823 Family history of stroke: Secondary | ICD-10-CM | POA: Diagnosis not present

## 2024-02-29 DIAGNOSIS — J449 Chronic obstructive pulmonary disease, unspecified: Secondary | ICD-10-CM | POA: Diagnosis present

## 2024-02-29 DIAGNOSIS — Z8547 Personal history of malignant neoplasm of testis: Secondary | ICD-10-CM

## 2024-02-29 DIAGNOSIS — I2510 Atherosclerotic heart disease of native coronary artery without angina pectoris: Principal | ICD-10-CM | POA: Diagnosis present

## 2024-02-29 DIAGNOSIS — I214 Non-ST elevation (NSTEMI) myocardial infarction: Secondary | ICD-10-CM | POA: Diagnosis not present

## 2024-02-29 DIAGNOSIS — D6489 Other specified anemias: Secondary | ICD-10-CM | POA: Diagnosis not present

## 2024-02-29 DIAGNOSIS — E785 Hyperlipidemia, unspecified: Secondary | ICD-10-CM | POA: Diagnosis not present

## 2024-02-29 DIAGNOSIS — Z8249 Family history of ischemic heart disease and other diseases of the circulatory system: Secondary | ICD-10-CM | POA: Diagnosis not present

## 2024-02-29 DIAGNOSIS — R079 Chest pain, unspecified: Secondary | ICD-10-CM | POA: Diagnosis not present

## 2024-02-29 HISTORY — PX: LEFT HEART CATH AND CORONARY ANGIOGRAPHY: CATH118249

## 2024-02-29 LAB — APTT: aPTT: 59 s — ABNORMAL HIGH (ref 24–36)

## 2024-02-29 LAB — CBC
HCT: 39.7 % (ref 39.0–52.0)
Hemoglobin: 13.7 g/dL (ref 13.0–17.0)
MCH: 30.2 pg (ref 26.0–34.0)
MCHC: 34.5 g/dL (ref 30.0–36.0)
MCV: 87.4 fL (ref 80.0–100.0)
Platelets: 202 10*3/uL (ref 150–400)
RBC: 4.54 MIL/uL (ref 4.22–5.81)
RDW: 13.8 % (ref 11.5–15.5)
WBC: 7 10*3/uL (ref 4.0–10.5)
nRBC: 0 % (ref 0.0–0.2)

## 2024-02-29 LAB — GLUCOSE, CAPILLARY
Glucose-Capillary: 142 mg/dL — ABNORMAL HIGH (ref 70–99)
Glucose-Capillary: 152 mg/dL — ABNORMAL HIGH (ref 70–99)
Glucose-Capillary: 131 mg/dL — ABNORMAL HIGH (ref 70–99)

## 2024-02-29 LAB — PROTIME-INR
INR: 1 (ref 0.8–1.2)
Prothrombin Time: 12.9 s (ref 11.4–15.2)

## 2024-02-29 LAB — HEPARIN LEVEL (UNFRACTIONATED): Heparin Unfractionated: >1.1 [IU]/mL — ABNORMAL HIGH (ref 0.30–0.70)

## 2024-02-29 SURGERY — LEFT HEART CATH AND CORONARY ANGIOGRAPHY
Anesthesia: Moderate Sedation | Laterality: Left

## 2024-02-29 MED ORDER — HYDRALAZINE HCL 20 MG/ML IJ SOLN
10.0000 mg | INTRAMUSCULAR | Status: AC | PRN
  Administered 2024-02-29 (×2): 10 mg via INTRAVENOUS
  Filled 2024-02-29: qty 1

## 2024-02-29 MED ORDER — ATORVASTATIN CALCIUM 80 MG PO TABS
80.0000 mg | ORAL_TABLET | Freq: Every day | ORAL | Status: DC
  Administered 2024-02-29: 80 mg via ORAL
  Filled 2024-02-29: qty 1

## 2024-02-29 MED ORDER — SODIUM CHLORIDE 0.9 % WEIGHT BASED INFUSION: 1.0000 mL/kg/h | INTRAVENOUS | Status: AC

## 2024-02-29 MED ORDER — SODIUM CHLORIDE 0.9% FLUSH: 3.0000 mL | INTRAVENOUS | Status: DC | PRN

## 2024-02-29 MED ORDER — SODIUM CHLORIDE 0.9 % IV SOLN: 250.0000 mL | INTRAVENOUS | Status: DC | PRN

## 2024-02-29 MED ORDER — METFORMIN HCL 500 MG PO TABS: 1000.0000 mg | ORAL_TABLET | Freq: Every day | ORAL | Status: DC

## 2024-02-29 MED ORDER — HEPARIN (PORCINE) 25000 UT/250ML-% IV SOLN
INTRAVENOUS | Status: AC
  Filled 2024-02-29: qty 250

## 2024-02-29 MED ORDER — ACETAMINOPHEN 325 MG PO TABS: 650.0000 mg | ORAL_TABLET | ORAL | Status: DC | PRN

## 2024-02-29 MED ORDER — HEPARIN SODIUM (PORCINE) 1000 UNIT/ML IJ SOLN
INTRAMUSCULAR | Status: AC
  Filled 2024-02-29: qty 10

## 2024-02-29 MED ORDER — LABETALOL HCL 5 MG/ML IV SOLN: 10.0000 mg | INTRAVENOUS | Status: AC | PRN

## 2024-02-29 MED ORDER — HEPARIN (PORCINE) IN NACL 1000-0.9 UT/500ML-% IV SOLN
INTRAVENOUS | Status: AC
  Filled 2024-02-29: qty 1000

## 2024-02-29 MED ORDER — MIDAZOLAM HCL 2 MG/2ML IJ SOLN
INTRAMUSCULAR | Status: AC
  Filled 2024-02-29: qty 2

## 2024-02-29 MED ORDER — HEPARIN SODIUM (PORCINE) 1000 UNIT/ML IJ SOLN
INTRAMUSCULAR | Status: DC | PRN
  Administered 2024-02-29: 5000 [IU] via INTRAVENOUS

## 2024-02-29 MED ORDER — GABAPENTIN 100 MG PO CAPS
100.0000 mg | ORAL_CAPSULE | Freq: Every day | ORAL | Status: DC
  Filled 2024-02-29: qty 1

## 2024-02-29 MED ORDER — LOSARTAN POTASSIUM 50 MG PO TABS
50.0000 mg | ORAL_TABLET | Freq: Every day | ORAL | Status: DC
  Administered 2024-02-29: 50 mg via ORAL
  Filled 2024-02-29: qty 1

## 2024-02-29 MED ORDER — FENTANYL CITRATE (PF) 100 MCG/2ML IJ SOLN
INTRAMUSCULAR | Status: AC
  Filled 2024-02-29: qty 2

## 2024-02-29 MED ORDER — ASPIRIN 81 MG PO TBEC: 81.0000 mg | DELAYED_RELEASE_TABLET | Freq: Every day | ORAL | Status: DC

## 2024-02-29 MED ORDER — AMLODIPINE BESYLATE 5 MG PO TABS
5.0000 mg | ORAL_TABLET | Freq: Every day | ORAL | Status: DC
  Administered 2024-02-29: 5 mg via ORAL
  Filled 2024-02-29: qty 1

## 2024-02-29 MED ORDER — ISOSORBIDE MONONITRATE ER 30 MG PO TB24
30.0000 mg | ORAL_TABLET | Freq: Every day | ORAL | Status: DC
  Administered 2024-02-29: 30 mg via ORAL
  Filled 2024-02-29: qty 1

## 2024-02-29 MED ORDER — SODIUM CHLORIDE 0.9 % WEIGHT BASED INFUSION
1.0000 mL/kg/h | INTRAVENOUS | Status: DC
  Administered 2024-02-29: 1 mL/kg/h via INTRAVENOUS

## 2024-02-29 MED ORDER — VERAPAMIL HCL 2.5 MG/ML IV SOLN
INTRAVENOUS | Status: AC
  Filled 2024-02-29: qty 2

## 2024-02-29 MED ORDER — LEVOTHYROXINE SODIUM 75 MCG PO TABS: 75.0000 ug | ORAL_TABLET | Freq: Every day | ORAL | Status: DC

## 2024-02-29 MED ORDER — MIDAZOLAM HCL 2 MG/2ML IJ SOLN
INTRAMUSCULAR | Status: DC | PRN
  Administered 2024-02-29: 1 mg via INTRAVENOUS

## 2024-02-29 MED ORDER — SODIUM CHLORIDE 0.9% FLUSH: 3.0000 mL | Freq: Two times a day (BID) | INTRAVENOUS | Status: DC

## 2024-02-29 MED ORDER — VERAPAMIL HCL 2.5 MG/ML IV SOLN
INTRAVENOUS | Status: DC | PRN
  Administered 2024-02-29: 2.5 mg via INTRAVENOUS

## 2024-02-29 MED ORDER — HEPARIN (PORCINE) IN NACL 2000-0.9 UNIT/L-% IV SOLN
INTRAVENOUS | Status: DC | PRN
  Administered 2024-02-29: 1000 mL

## 2024-02-29 MED ORDER — NITROGLYCERIN 0.4 MG SL SUBL: 0.4000 mg | SUBLINGUAL_TABLET | SUBLINGUAL | Status: DC | PRN

## 2024-02-29 MED ORDER — AMLODIPINE BESYLATE 5 MG PO TABS: 5.0000 mg | ORAL_TABLET | Freq: Every day | ORAL | Status: DC

## 2024-02-29 MED ORDER — HEPARIN (PORCINE) 25000 UT/250ML-% IV SOLN: 1000.0000 [IU]/h | INTRAVENOUS | Status: DC

## 2024-02-29 MED ORDER — IOHEXOL 300 MG/ML  SOLN
INTRAMUSCULAR | Status: DC | PRN
  Administered 2024-02-29: 90 mL

## 2024-02-29 MED ORDER — ONDANSETRON HCL 4 MG/2ML IJ SOLN
4.0000 mg | Freq: Four times a day (QID) | INTRAMUSCULAR | Status: DC | PRN
Start: 2024-02-29 — End: 2024-02-29

## 2024-02-29 MED ORDER — ASPIRIN 81 MG PO CHEW
CHEWABLE_TABLET | ORAL | Status: AC
  Administered 2024-02-29: 81 mg via ORAL
  Filled 2024-02-29: qty 1

## 2024-02-29 MED ORDER — METOPROLOL SUCCINATE ER 50 MG PO TB24
50.0000 mg | ORAL_TABLET | Freq: Every day | ORAL | Status: DC
  Administered 2024-02-29: 50 mg via ORAL
  Filled 2024-02-29: qty 1

## 2024-02-29 MED ORDER — METOPROLOL SUCCINATE ER 50 MG PO TB24: 50.0000 mg | ORAL_TABLET | Freq: Every day | ORAL | Status: DC

## 2024-02-29 MED ORDER — FENTANYL CITRATE (PF) 100 MCG/2ML IJ SOLN
INTRAMUSCULAR | Status: DC | PRN
  Administered 2024-02-29: 25 ug via INTRAVENOUS

## 2024-02-29 MED ORDER — HEPARIN (PORCINE) 25000 UT/250ML-% IV SOLN
10.0000 [IU]/kg/h | INTRAVENOUS | Status: DC
  Administered 2024-02-29: 10 [IU]/kg/h via INTRAVENOUS

## 2024-02-29 MED ORDER — SODIUM CHLORIDE 0.9 % WEIGHT BASED INFUSION
3.0000 mL/kg/h | INTRAVENOUS | Status: AC
  Administered 2024-02-29: 3 mL/kg/h via INTRAVENOUS

## 2024-02-29 MED ORDER — GLIMEPIRIDE 4 MG PO TABS: 4.0000 mg | ORAL_TABLET | Freq: Every day | ORAL | Status: DC

## 2024-02-29 MED ORDER — ASPIRIN 81 MG PO CHEW: 81.0000 mg | CHEWABLE_TABLET | ORAL | Status: AC

## 2024-02-29 MED ORDER — PANTOPRAZOLE SODIUM 40 MG PO TBEC
40.0000 mg | DELAYED_RELEASE_TABLET | Freq: Every day | ORAL | Status: DC
  Administered 2024-02-29: 40 mg via ORAL
  Filled 2024-02-29: qty 1

## 2024-02-29 MED ORDER — LIDOCAINE HCL (PF) 1 % IJ SOLN
INTRAMUSCULAR | Status: DC | PRN
  Administered 2024-02-29: 2 mL

## 2024-02-29 SURGICAL SUPPLY — 11 items
CATH 5FR JL3.5 JR4 ANG PIG MP (CATHETERS) IMPLANT
DEVICE RAD TR BAND REGULAR (VASCULAR PRODUCTS) IMPLANT
DRAPE BRACHIAL (DRAPES) IMPLANT
GLIDESHEATH SLEND SS 6F .021 (SHEATH) IMPLANT
GUIDEWIRE INQWIRE 1.5J.035X260 (WIRE) IMPLANT
INQWIRE 1.5J .035X260CM (WIRE) ×1 IMPLANT
KIT SYRINGE INJ CVI SPIKEX1 (MISCELLANEOUS) IMPLANT
PACK CARDIAC CATH (CUSTOM PROCEDURE TRAY) ×1 IMPLANT
PROTECTION STATION PRESSURIZED (MISCELLANEOUS) ×1 IMPLANT
SET ATX-X65L (MISCELLANEOUS) IMPLANT
STATION PROTECTION PRESSURIZED (MISCELLANEOUS) IMPLANT

## 2024-02-29 NOTE — Plan of Care (Signed)

## 2024-02-29 NOTE — Progress Notes (Signed)
 Report given to Darryl RN at [facility]. [facility] came to pick patient to transfer to [facility]. Will continue to monitor.

## 2024-02-29 NOTE — Consult Note (Signed)
 PHARMACY - ANTICOAGULATION CONSULT NOTE  Pharmacy Consult for heparin infusion Indication: chest pain/ACS  No Known Allergies  Patient Measurements: Height: 5\' 9"  (175.3 cm) Weight: 99.5 kg (219 lb 5.7 oz) IBW/kg (Calculated) : 70.7 HEPARIN DW (KG): 91.7  Vital Signs: Temp: 97.5 F (36.4 C) (04/09 1042) Temp Source: Oral (04/09 1042) BP: 138/69 (04/09 1230) Pulse Rate: 83 (04/09 1230)  Labs: No results for input(s): "HGB", "HCT", "PLT", "APTT", "LABPROT", "INR", "HEPARINUNFRC", "HEPRLOWMOCWT", "CREATININE", "CKTOTAL", "CKMB", "TROPONINIHS" in the last 72 hours.  CrCl cannot be calculated (Patient's most recent lab result is older than the maximum 21 days allowed.).   Medical History: Past Medical History:  Diagnosis Date   Coronary artery disease    s/p three coronary stents   Diabetes mellitus without complication (HCC)    Hypercholesterolemia    Hypertension    Hypothyroidism    Testicular cancer (HCC)     Medications:  No home anticoagulants per pharmacist review  Assessment: 78 yo male presented for elective LHC.  Patient found to have significant left main disease.  Patient to be transferred to [facility] for CABG evaluation.  Heparin drip initiated in Cath Lab.  BL labs ordered  Goal of Therapy:  Heparin level 0.3-0.7 units/ml Monitor platelets by anticoagulation protocol: Yes   Plan:  Heparin infusion started in Cath lab at 1000 units/hr 4/9 @ 1045 No documentation of heparin bolus Check Anti-Xa level 8 hours after initiation CBC daily while on heparin  Barrie Folk, PharmD 02/29/2024,1:02 PM

## 2024-02-29 NOTE — Progress Notes (Signed)
 Brief Cardiology Note (not for billing)  Patient presented for outpatient LHC this am with Dr. Darrold Junker. Found to have significant left main disease, will be transferred to [facility] for CABG evaluation. Home medications reordered, holding plavix pending CABG. Patient is stable without complaints. EMTALA to be completed prior to patient departure.   Signed: Gale Journey, PA-C  [facility] 02/29/24 10:38 AM

## 2024-03-01 ENCOUNTER — Other Ambulatory Visit (HOSPITAL_COMMUNITY)

## 2024-03-01 ENCOUNTER — Encounter (HOSPITAL_COMMUNITY)

## 2024-03-01 ENCOUNTER — Encounter: Payer: Self-pay | Admitting: Cardiology

## 2024-03-01 ENCOUNTER — Inpatient Hospital Stay (HOSPITAL_COMMUNITY)

## 2024-03-01 DIAGNOSIS — I2510 Atherosclerotic heart disease of native coronary artery without angina pectoris: Principal | ICD-10-CM | POA: Insufficient documentation

## 2024-03-01 DIAGNOSIS — E785 Hyperlipidemia, unspecified: Secondary | ICD-10-CM

## 2024-03-01 DIAGNOSIS — R079 Chest pain, unspecified: Secondary | ICD-10-CM | POA: Diagnosis not present

## 2024-03-01 DIAGNOSIS — Z01810 Encounter for preprocedural cardiovascular examination: Secondary | ICD-10-CM

## 2024-03-01 DIAGNOSIS — I25110 Atherosclerotic heart disease of native coronary artery with unstable angina pectoris: Secondary | ICD-10-CM

## 2024-03-01 DIAGNOSIS — I10 Essential (primary) hypertension: Secondary | ICD-10-CM

## 2024-03-01 LAB — CBC WITH DIFFERENTIAL/PLATELET
Abs Immature Granulocytes: 0.03 10*3/uL (ref 0.00–0.07)
Basophils Absolute: 0.1 10*3/uL (ref 0.0–0.1)
Basophils Relative: 1 %
Eosinophils Absolute: 0.3 10*3/uL (ref 0.0–0.5)
Eosinophils Relative: 5 %
HCT: 38.2 % — ABNORMAL LOW (ref 39.0–52.0)
Hemoglobin: 12.9 g/dL — ABNORMAL LOW (ref 13.0–17.0)
Immature Granulocytes: 0 %
Lymphocytes Relative: 28 %
Lymphs Abs: 1.9 10*3/uL (ref 0.7–4.0)
MCH: 29.9 pg (ref 26.0–34.0)
MCHC: 33.8 g/dL (ref 30.0–36.0)
MCV: 88.6 fL (ref 80.0–100.0)
Monocytes Absolute: 0.7 10*3/uL (ref 0.1–1.0)
Monocytes Relative: 11 %
Neutro Abs: 3.8 10*3/uL (ref 1.7–7.7)
Neutrophils Relative %: 55 %
Platelets: 210 10*3/uL (ref 150–400)
RBC: 4.31 MIL/uL (ref 4.22–5.81)
RDW: 14.1 % (ref 11.5–15.5)
WBC: 6.8 10*3/uL (ref 4.0–10.5)
nRBC: 0 % (ref 0.0–0.2)

## 2024-03-01 LAB — LIPID PANEL
Cholesterol: 192 mg/dL (ref 0–200)
HDL: 46 mg/dL (ref >40)
LDL Cholesterol: 90 mg/dL (ref 0–99)
Total CHOL/HDL Ratio: 4.2 ratio
Triglycerides: 282 mg/dL — ABNORMAL HIGH (ref <150)
VLDL: 56 mg/dL — ABNORMAL HIGH (ref 0–40)

## 2024-03-01 LAB — COMPREHENSIVE METABOLIC PANEL WITH GFR
ALT: 18 U/L (ref 0–44)
AST: 23 U/L (ref 15–41)
Albumin: 3.6 g/dL (ref 3.5–5.0)
Alkaline Phosphatase: 49 U/L (ref 38–126)
Anion gap: 10 (ref 5–15)
BUN: 15 mg/dL (ref 8–23)
CO2: 21 mmol/L — ABNORMAL LOW (ref 22–32)
Calcium: 9 mg/dL (ref 8.9–10.3)
Chloride: 103 mmol/L (ref 98–111)
Creatinine, Ser: 1.1 mg/dL (ref 0.61–1.24)
GFR, Estimated: >60 mL/min (ref >60)
Glucose, Bld: 187 mg/dL — ABNORMAL HIGH (ref 70–99)
Potassium: 3.5 mmol/L (ref 3.5–5.1)
Sodium: 134 mmol/L — ABNORMAL LOW (ref 135–145)
Total Bilirubin: 0.6 mg/dL (ref 0.0–1.2)
Total Protein: 6.9 g/dL (ref 6.5–8.1)

## 2024-03-01 LAB — MAGNESIUM: Magnesium: 1.8 mg/dL (ref 1.7–2.4)

## 2024-03-01 LAB — VAS US DOPPLER PRE CABG

## 2024-03-01 LAB — ECHOCARDIOGRAM COMPLETE
AR max vel: 1.51 cm2
AV Area VTI: 1.57 cm2
AV Area mean vel: 1.37 cm2
AV Mean grad: 5 mmHg
AV Peak grad: 10.1 mmHg
Ao pk vel: 1.59 m/s
Area-P 1/2: 3.77 cm2
Calc EF: 71.5 %
Height: 69 in
S' Lateral: 4.2 cm
Single Plane A2C EF: 76.4 %
Single Plane A4C EF: 67.5 %
Weight: 3509.72 [oz_av]

## 2024-03-01 LAB — BRAIN NATRIURETIC PEPTIDE: B Natriuretic Peptide: 103.8 pg/mL — ABNORMAL HIGH (ref 0.0–100.0)

## 2024-03-01 LAB — TYPE AND SCREEN
ABO/RH(D): A POS
Antibody Screen: NEGATIVE

## 2024-03-01 LAB — HEPARIN LEVEL (UNFRACTIONATED)
Heparin Unfractionated: >1.1 [IU]/mL — ABNORMAL HIGH (ref 0.30–0.70)
Heparin Unfractionated: <0.1 [IU]/mL — ABNORMAL LOW (ref 0.30–0.70)
Heparin Unfractionated: >1.1 [IU]/mL — ABNORMAL HIGH (ref 0.30–0.70)

## 2024-03-01 LAB — GLUCOSE, CAPILLARY
Glucose-Capillary: 142 mg/dL — ABNORMAL HIGH (ref 70–99)
Glucose-Capillary: 148 mg/dL — ABNORMAL HIGH (ref 70–99)
Glucose-Capillary: 155 mg/dL — ABNORMAL HIGH (ref 70–99)
Glucose-Capillary: 160 mg/dL — ABNORMAL HIGH (ref 70–99)

## 2024-03-01 LAB — TSH: TSH: 2.918 u[IU]/mL (ref 0.350–4.500)

## 2024-03-01 LAB — ABO/RH: ABO/RH(D): A POS

## 2024-03-01 MED ORDER — PANTOPRAZOLE SODIUM 40 MG PO TBEC
40.0000 mg | DELAYED_RELEASE_TABLET | Freq: Every day | ORAL | Status: DC
  Administered 2024-03-01 – 2024-03-03 (×3): 40 mg via ORAL
  Filled 2024-03-01 (×3): qty 1

## 2024-03-01 MED ORDER — EZETIMIBE 10 MG PO TABS
10.0000 mg | ORAL_TABLET | Freq: Every day | ORAL | Status: DC
  Administered 2024-03-01 – 2024-03-03 (×3): 10 mg via ORAL
  Filled 2024-03-01 (×3): qty 1

## 2024-03-01 MED ORDER — ATORVASTATIN CALCIUM 80 MG PO TABS
80.0000 mg | ORAL_TABLET | Freq: Every day | ORAL | Status: DC
  Administered 2024-03-01 – 2024-03-02 (×2): 80 mg via ORAL
  Filled 2024-03-01 (×2): qty 1

## 2024-03-01 MED ORDER — AMLODIPINE BESYLATE 5 MG PO TABS
5.0000 mg | ORAL_TABLET | Freq: Every day | ORAL | Status: DC
Start: 2024-03-01 — End: 2024-03-03
  Administered 2024-03-01 – 2024-03-03 (×3): 5 mg via ORAL
  Filled 2024-03-01 (×3): qty 1

## 2024-03-01 MED ORDER — LEVOTHYROXINE SODIUM 75 MCG PO TABS
75.0000 ug | ORAL_TABLET | Freq: Every day | ORAL | Status: DC
  Administered 2024-03-01 – 2024-03-03 (×3): 75 ug via ORAL
  Filled 2024-03-01 (×3): qty 1

## 2024-03-01 MED ORDER — NITROGLYCERIN 0.4 MG SL SUBL
0.4000 mg | SUBLINGUAL_TABLET | SUBLINGUAL | Status: DC | PRN
  Administered 2024-03-01 – 2024-03-03 (×4): 0.4 mg via SUBLINGUAL
  Filled 2024-03-01: qty 1

## 2024-03-01 MED ORDER — CLOPIDOGREL BISULFATE 75 MG PO TABS
75.0000 mg | ORAL_TABLET | Freq: Every day | ORAL | Status: DC
  Administered 2024-03-01 – 2024-03-03 (×3): 75 mg via ORAL
  Filled 2024-03-01 (×3): qty 1

## 2024-03-01 MED ORDER — ASPIRIN 81 MG PO TBEC
81.0000 mg | DELAYED_RELEASE_TABLET | Freq: Every day | ORAL | Status: DC
Start: 2024-03-02 — End: 2024-03-03
  Administered 2024-03-02 – 2024-03-03 (×2): 81 mg via ORAL
  Filled 2024-03-01 (×2): qty 1

## 2024-03-01 MED ORDER — VITAMIN D3 25 MCG (1000 UNIT) PO TABS
2000.0000 [IU] | ORAL_TABLET | Freq: Every day | ORAL | Status: DC
  Administered 2024-03-01 – 2024-03-03 (×3): 2000 [IU] via ORAL
  Filled 2024-03-01 (×6): qty 2

## 2024-03-01 MED ORDER — GABAPENTIN 100 MG PO CAPS
100.0000 mg | ORAL_CAPSULE | Freq: Every day | ORAL | Status: DC
  Administered 2024-03-01 – 2024-03-02 (×2): 100 mg via ORAL
  Filled 2024-03-01 (×2): qty 1

## 2024-03-01 MED ORDER — ONDANSETRON HCL 4 MG/2ML IJ SOLN: 4.0000 mg | Freq: Four times a day (QID) | INTRAMUSCULAR | Status: DC | PRN

## 2024-03-01 MED ORDER — NITROGLYCERIN 2 % TD OINT
0.5000 [in_us] | TOPICAL_OINTMENT | Freq: Four times a day (QID) | TRANSDERMAL | Status: DC
  Filled 2024-03-01: qty 30

## 2024-03-01 MED ORDER — LOSARTAN POTASSIUM 50 MG PO TABS
50.0000 mg | ORAL_TABLET | Freq: Every day | ORAL | Status: DC
  Administered 2024-03-01 – 2024-03-03 (×3): 50 mg via ORAL
  Filled 2024-03-01 (×3): qty 1

## 2024-03-01 MED ORDER — HEPARIN (PORCINE) 25000 UT/250ML-% IV SOLN
1400.0000 [IU]/h | INTRAVENOUS | Status: DC
  Administered 2024-03-01: 900 [IU]/h via INTRAVENOUS
  Administered 2024-03-02: 1150 [IU]/h via INTRAVENOUS
  Filled 2024-03-01: qty 250

## 2024-03-01 MED ORDER — INSULIN ASPART 100 UNIT/ML IJ SOLN
0.0000 [IU] | Freq: Three times a day (TID) | INTRAMUSCULAR | Status: DC
  Administered 2024-03-01 – 2024-03-03 (×4): 1 [IU] via SUBCUTANEOUS

## 2024-03-01 MED ORDER — ISOSORBIDE MONONITRATE ER 30 MG PO TB24
30.0000 mg | ORAL_TABLET | Freq: Every day | ORAL | Status: DC
  Administered 2024-03-01: 30 mg via ORAL
  Filled 2024-03-01: qty 1

## 2024-03-01 MED ORDER — ADULT MULTIVITAMIN W/MINERALS CH
1.0000 | ORAL_TABLET | Freq: Every day | ORAL | Status: DC
  Administered 2024-03-01 – 2024-03-03 (×3): 1 via ORAL
  Filled 2024-03-01 (×3): qty 1

## 2024-03-01 MED ORDER — ACETAMINOPHEN 325 MG PO TABS
650.0000 mg | ORAL_TABLET | ORAL | Status: DC | PRN
  Administered 2024-03-01: 650 mg via ORAL
  Filled 2024-03-01 (×2): qty 2

## 2024-03-01 MED ORDER — HEPARIN (PORCINE) 25000 UT/250ML-% IV SOLN: 1000.0000 [IU]/h | INTRAVENOUS | Status: DC

## 2024-03-01 MED ORDER — SENNA 8.6 MG PO TABS
2.0000 | ORAL_TABLET | Freq: Every day | ORAL | Status: DC
  Administered 2024-03-01 – 2024-03-02 (×2): 17.2 mg via ORAL
  Filled 2024-03-01 (×2): qty 2

## 2024-03-01 MED ORDER — HEPARIN (PORCINE) 25000 UT/250ML-% IV SOLN
1000.0000 [IU]/h | INTRAVENOUS | Status: DC
  Administered 2024-03-01: 1000 [IU]/h via INTRAVENOUS
  Filled 2024-03-01: qty 250

## 2024-03-01 MED ORDER — METOPROLOL SUCCINATE ER 50 MG PO TB24
50.0000 mg | ORAL_TABLET | Freq: Every day | ORAL | Status: DC
  Administered 2024-03-01: 50 mg via ORAL
  Filled 2024-03-01: qty 1

## 2024-03-01 NOTE — Consult Note (Signed)
 PHARMACY - ANTICOAGULATION CONSULT NOTE  Pharmacy Consult for heparin infusion Indication: chest pain/ACS  No Known Allergies  Vital Signs: Temp: 97.8 F (36.6 C) (04/09 2300) Temp Source: Oral (04/09 2300) BP: 151/83 (04/09 2300) Pulse Rate: 87 (04/09 2300)  Labs: Recent Labs    02/29/24 1428 02/29/24 1918 03/01/24 0116  HGB 13.7  --  12.9*  HCT 39.7  --  38.2*  PLT 202  --  210  APTT 59*  --   --   LABPROT 12.9  --   --   INR 1.0  --   --   HEPARINUNFRC  --  >1.10* >1.10*    CrCl cannot be calculated (Patient's most recent lab result is older than the maximum 21 days allowed.).   Medical History: Past Medical History:  Diagnosis Date   Coronary artery disease    s/p three coronary stents   Diabetes mellitus without complication (HCC)    Hypercholesterolemia    Hypertension    Hypothyroidism    Testicular cancer (HCC)     Medications:  No home anticoagulants per pharmacist review  Assessment: 78 yo male presented for elective LHC.  Patient found to have significant left main disease.  Patient to be transferred to [facility] for CABG evaluation.  Heparin drip initiated in Cath Lab.  AM: heparin level >1.1 on 1000 units/hr. Per RN, no signs/symptoms of bleeding and level drawn correctly. Received ~5000 units in cath lab on 4/9 ~1000  Goal of Therapy:  Heparin level 0.3-0.7 units/ml Monitor platelets by anticoagulation protocol: Yes   Plan:  -Hold heparin for 1 hour -Restart heparin infusion at 800 units/hr -Check Anti-Xa level 8 hours after initiation -CBC daily while on heparin  Arabella Merles, PharmD. Clinical Pharmacist 03/01/2024 1:58 AM

## 2024-03-01 NOTE — Progress Notes (Signed)
  Echocardiogram 2D Echocardiogram has been performed.  Rosemary Holms, RDCS 03/01/2024, 10:43 AM

## 2024-03-01 NOTE — Progress Notes (Signed)
   Discussed with Dr. Tresa Endo, reports that patient is not a surgical candidate per Dr. Laneta Simmers. Will resume plavix and switch nitropaste to Imdur.   SignedLaverda Page, NP-C 03/01/2024, 2:06 PM Pager: [phone]

## 2024-03-01 NOTE — Consult Note (Addendum)
 [address]       [place] [address]             [phone]        Haydee Salter. [name] Medical Record #[id number] Date of Birth: February 17, 1946  Referring: Marcina Millard, MD Primary Care: Barbette Reichmann, MD Primary Cardiologist:None  Reason for consult: Evaluation for potential coronary bypass grafting for severe multivessel and left main coronary artery disease.  History of Present Illness: [name] is a 78 year old gentleman with a past history of hypertension, type 2 diabetes mellitus, macrocytosis dyslipidemia, hypothyroidism, COPD, former tobacco smoker having quit in 2015, and lumbar radiculopathy with L3-4 foraminal stenosis.  He has known coronary artery disease dating back with initial PCI to the ramus intermedius  in 1988.  He had subsequent stenting of the RCA in 2005 and since then has had additional PCI for in-stent stenosis in the RCA. Jason Mclean presented to the [facility] ED on 01/22/2019 complaining of chest pain radiating across his chest into both arms occurring both with exertion and rest workup in the ED included an EKG showing no acute ischemic changes.  CTA chest was negative for pulmonary embolus or acute aortic pathology.  Initial high-sensitivity troponin was 11 and subsequent assay was 63.  He was seen by cardiology.  Continuation of medical therapy along with the addition of IV heparin was recommended.  After stabilization and titration of his medications, he was discharged home.    Jason Mclean was seen for hospital follow-up by Dr. Cassie Freer at the [facility] on 3/21.  At that time, Jason Mclean reported having a couple more episodes of angina at home since his discharge from the hospital.  A nuclear myocardial perfusion Mclean was ordered and showed a small inferoapical perfusion abnormality that was reversible.  Left heart catheterization was recommended and was completed yesterday.  The Mclean showed advanced  multivessel coronary artery disease with an 80% left main coronary artery stenosis.  LV EDP was normal and the ventriculogram showed no wall motion abnormalities.  Jason Mclean was transferred to [facility] for CT surgery evaluation for potential coronary bypass grafting.  He has been on long-term dual antiplatelet therapy with his last dose of Plavix on 01/27/2024.  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  He remains active with caring for his home and yard and said he had considered returning to [employer] to work part-time.  His girlfriend had coronary bypass grafting in this hospital several years ago.  Currently, Jason Mclean is resting in the bed with heparin infusing intravenously.  Near the conclusion of this encounter, he had an episode of angina with radiation into his neck and both arms.  He was treated with sublingual nitroglycerin by his nurse with improvement of the symptoms.   Current Activity/ Functional Status: Patient is independent with mobility/ambulation, transfers, ADL's, IADL's.   Zubrod Score: At the time of surgery this patient's most appropriate activity status/level should be described as: []     0    Normal activity, no symptoms []     1    Restricted in physical strenuous activity but ambulatory, able to do out light work [x]     2    Ambulatory and capable of self care, unable to do work activities, up and about                 more  than 50%  Of the time                            []     3    Only limited self care, in bed greater than 50% of waking hours []     4    Completely disabled, no self care, confined to bed or chair []     5    Moribund  Past Medical History:  Diagnosis Date   Coronary artery disease    s/p three coronary stents   Diabetes mellitus without complication (HCC)    Hypercholesterolemia    Hypertension    Hypothyroidism    Testicular cancer [facility])     Past Surgical History:  Procedure  Laterality Date   CATARACT EXTRACTION W/PHACO Left 01/27/2022   Procedure: CATARACT EXTRACTION PHACO AND INTRAOCULAR LENS PLACEMENT (IOC) LEFT DIABETIC 6.17 01:17.1;  Surgeon: Lockie Mola, MD;  Location: [facility];  Service: Ophthalmology;  Laterality: Left;  Diabetic   CATARACT EXTRACTION W/PHACO Right 02/10/2022   Procedure: CATARACT EXTRACTION PHACO AND INTRAOCULAR LENS PLACEMENT (IOC) RIGHT DIABETIC 10.48 01:19.8;  Surgeon: Lockie Mola, MD;  Location: [facility];  Service: Ophthalmology;  Laterality: Right;  Diabetic   CORONARY ANGIOPLASTY WITH STENT PLACEMENT     CORONARY BALLOON ANGIOPLASTY N/A 01/29/2020   Procedure: CORONARY STENT INTERVENTION;  Surgeon: Marcina Millard, MD;  Location: [facility] INVASIVE CV LAB;  Service: Cardiovascular;  Laterality: N/A;   CORONARY STENT INTERVENTION N/A 06/16/2018   Procedure: CORONARY STENT INTERVENTION;  Surgeon: Marcina Millard, MD;  Location: [facility] INVAS[facility] CV [facility];  Service: Cardiovascular;  Laterality: N/A;   LEFT HEART CATH AND CORONARY ANGIOGRAPHY N/A 06/16/2018   Procedure: LEFT HEART CATH AND CORONARY ANGIOGRAPHY;  Surgeon: Marcina Millard, MD;  Location: [facility] INV[facility] CV [facility];  Service: Cardiovascular;  Laterality: N/A;   LEFT HEART CATH AND CORONARY ANGIOGRAPHY Left 01/29/2020   Procedure: LEFT HEART CATH AND CORONARY ANGIOGRAPHY;  Surgeon: Marcina Millard, MD;  Location: [facility] INV[facility] CV [facility];  Service: Cardiovascular;  Laterality: Left;   LEFT HEART CATH AND CORONARY ANGIOGRAPHY Left 02/29/2024   Procedure: LEFT HEART CATH AND CORONARY ANGIOGRAPHY;  Surgeon: Marcina Millard, MD;  Location: [facility] INVASIVE CV LAB;  Service: Cardiovascular;  Laterality: Left;    Social History   Tobacco Use  Smoking Status Former   Current packs/day: 0.00   Types: Cigarettes   Quit date: 2014   Years since quitting: 11.2  Smokeless Tobacco Never    Social History   Substance and Sexual Activity  Alcohol Use  Not Currently     No Known Allergies  Current Facility-Administered Medications  Medication Dose Route Frequency Provider Last Rate Last Admin   acetaminophen (TYLENOL) tablet 650 mg  650 mg Oral Q4H PRN Karl Ito, MD   650 mg at 03/01/24 0130   amLODipine (NORVASC) tablet 5 mg  5 mg Oral Daily Karl Ito, MD       [START ON 03/02/2024] aspirin EC tablet 81 mg  81 mg Oral Daily Karl Ito, MD       atorvastatin (LIPITOR) tablet 80 mg  80 mg Oral q1800 Karl Ito, MD       cholecalciferol (VITAMIN D3) tablet 2,000 Units  2,000 Units Oral Daily Karl Ito, MD       ezetimibe (ZETIA) tablet 10 mg  10 mg Oral Daily Karl Ito, MD       gabapentin (NEURONTIN) capsule 100 mg  100 mg  Oral QHS Karl Ito, MD       heparin ADULT infusion 100 units/mL (25000 units/225mL)  800 Units/hr Intravenous Continuous Arabella Merles, RPH 8 mL/hr at 03/01/24 0345 800 Units/hr at 03/01/24 0345   insulin aspart (novoLOG) injection 0-8 Units  0-8 Units Subcutaneous TID WC Karl Ito, MD       levothyroxine (SYNTHROID) tablet 75 mcg  75 mcg Oral QAC breakfast Karl Ito, MD   75 mcg at 03/01/24 0645   losartan (COZAAR) tablet 50 mg  50 mg Oral Daily Karl Ito, MD       metoprolol succinate (TOPROL-XL) 24 hr tablet 50 mg  50 mg Oral Daily Karl Ito, MD       multivitamin with minerals tablet 1 tablet  1 tablet Oral Daily Karl Ito, MD       nitroGLYCERIN (NITROSTAT) SL tablet 0.4 mg  0.4 mg Sublingual Q5 Min x 3 PRN Karl Ito, MD       ondansetron [facility]) injection 4 mg  4 mg Intravenous Q6H PRN Karl Ito, MD       pantoprazole (PROTONIX) EC tablet 40 mg  40 mg Oral Daily Karl Ito, MD       senna Mancel Parsons) tablet 17.2 mg  2 tablet Oral Daily Karl Ito, MD        Medications Prior to Admission  Medication Sig Dispense Refill Last Dose/Taking   amLODipine (NORVASC) 5 MG tablet Take 5 mg by mouth daily.   02/29/2024    aspirin EC 81 MG tablet Take 81 mg by mouth daily.    02/29/2024   atorvastatin (LIPITOR) 80 MG tablet Take 1 tablet (80 mg total) by mouth daily at 6 PM. 30 tablet 0 02/29/2024   Cholecalciferol (VITAMIN D3) 50 MCG (2000 UT) TABS Take 2,000 Units by mouth daily.   02/29/2024   clopidogrel (PLAVIX) 75 MG tablet Take 1 tablet (75 mg total) by mouth daily with breakfast. (Patient taking differently: Take 75 mg by mouth at bedtime.) 30 tablet 0 02/29/2024   Coenzyme Q10 100 MG TABS Take 100 mg by mouth every other day.    02/29/2024   gabapentin (NEURONTIN) 100 MG capsule Take 100 mg by mouth at bedtime.   02/29/2024   glimepiride (AMARYL) 4 MG tablet Take 4 mg by mouth daily with breakfast.   02/29/2024   isosorbide mononitrate (IMDUR) 30 MG 24 hr tablet Take 1 tablet (30 mg total) by mouth daily. 30 tablet 2 02/29/2024   levothyroxine (SYNTHROID, LEVOTHROID) 75 MCG tablet Take 75 mcg by mouth daily before breakfast.   3 02/29/2024   losartan (COZAAR) 50 MG tablet Take 50 mg by mouth daily.  3 02/29/2024   metFORMIN (GLUCOPHAGE) 1000 MG tablet Take 1,000 mg by mouth at bedtime.   02/29/2024   metoprolol succinate (TOPROL-XL) 50 MG 24 hr tablet Take 1 tablet (50 mg total) by mouth daily. Take with or immediately following a meal. 30 tablet 2 02/29/2024   Multiple Vitamin (MULTIVITAMIN WITH MINERALS) TABS tablet Take 1 tablet by mouth daily. Centrum   02/29/2024   nitroGLYCERIN (NITROSTAT) 0.4 MG SL tablet Place 1 tablet (0.4 mg total) under the tongue every 5 (five) minutes x 3 doses as needed for chest pain. 20 tablet 12 02/29/2024   pantoprazole (PROTONIX) 40 MG tablet Take 40 mg by mouth daily.   02/29/2024   RYBELSUS 14 MG TABS Take 1 tablet by mouth daily.   Taking   TURMERIC PO Take by mouth.   02/29/2024  Family History  Problem Relation Age of Onset   Strabismus Mother    Stroke Mother    CAD Father      Review of Systems:      Cardiac Review of Systems: Y or  [    ]= no  Chest Pain [  x  ]  Resting SOB [   x ] Exertional SOB  [ x ]  Orthopnea [  ]   Pedal Edema [   ]    Palpitations [  ] Syncope  [  ]   Presyncope [   ]  General Review of Systems: [Y] = yes [  ]=no Constitional: recent weight change [  ]; anorexia [  ]; fatigue [ x ]; nausea [  ]; night sweats [  ]; fever [  ]; or chills [  ]                                                               Dental: Last Dentist visit: He is edentulous  Eye : blurred vision [  ]; diplopia [   ]; vision changes [  ];  Amaurosis fugax[  ]; Resp: cough [  ];  wheezing[  ];  hemoptysis[  ];  shortness of breath[x ]; paroxysmal nocturnal dyspnea[  ]; dyspnea on exertion[  ]; or orthopnea[  ];  GI:  gallstones[  ], vomiting[  ];  dysphagia[  ]; melena[  ];  hematochezia [  ]; heartburn[  ];   Hx of  Colonoscopy[  ]; GU: kidney stones [  ]; hematuria[  ];   dysuria [  ];  nocturia[  ];  history of     obstruction [  ]; urinary frequency [  ]             Skin: rash, swelling[  ];, hair loss[  ];  peripheral edema[  ];  or itching[  ]; Musculosketetal: myalgias[  ];  joint swelling[  ];  joint erythema[  ];  joint pain[  ];  back pain[  ];  Heme/Lymph: bruising[  ];  bleeding[  ];  anemia[  ];  Neuro: TIA[  ];  headaches[  ];  stroke[  ];  vertigo[  ];  seizures[  ];   paresthesias[  ];  difficulty walking[  ];  Psych:depression[  ]; anxiety[  ];  Endocrine: diabetes[ x ];  thyroid dysfunction[  ];               Physical Exam: BP 139/65 (BP Location: Left Arm)   Pulse 87   Temp (!) 97.4 F (36.3 C) (Oral)   Resp 13   Ht 5\' 9"  (1.753 m)   Wt 99.5 kg   SpO2 96%   BMI 32.39 kg/m    General appearance: alert, cooperative, and mild distress Head: Normocephalic, without obvious abnormality, atraumatic Neck: no adenopathy, no carotid bruit, no JVD, and supple, symmetrical, trachea midline Lymph nodes: No cervical or clavicular adenopathy Resp: Normal work of breathing on room air.  Breath sounds full, equal, and clear to auscultation Cardio: Regular  rate and rhythm, no murmur.  Monitor shows normal sinus rhythm with no significant arrhythmias GI: Soft, nontender, active bowel sounds Extremities: No obvious deformity, no peripheral edema.  All peripheral  pulses are palpable Neurologic: Grossly normal  Diagnostic Studies & Laboratory data:  LEFT HEART CATH AND CORONARY ANGIOGRAPHY   Conclusion      Prox RCA to Mid RCA lesion is 30% stenosed.   Mid RCA lesion is 75% stenosed.   Mid RCA to Dist RCA lesion is 60% stenosed.   3rd RPL lesion is 80% stenosed.   RPDA lesion is 90% stenosed.   Mid LAD lesion is 60% stenosed.   Mid LM to Dist LM lesion is 80% stenosed.   1st Mrg lesion is 75% stenosed.   The left ventricular systolic function is normal.   LV end diastolic pressure is normal.   The left ventricular ejection fraction is 50-55% by visual estimate.   1.  Left main and three-vessel coronary artery disease, with hazy appearing 80% stenosis distal left main, 60% stenosis mid LAD, 75% stenosis small caliber OM1, 75% in-stent restenosis mid RCA, 90% stenosis small caliber RPDA, 80% stenosis small caliber RPL 3 2.  Normal left ventricular function 3.  Unstable angina  Diagnostic Dominance: Right    Recommendations   1.  Transfer to [facility] for CABG 2.  Hold clopidogrel 3.  Start heparin drip   Recent Radiology Findings:   CLINICAL DATA:  Acute aortic syndrome suspected. Sharp pain radiating from chest to back for 2 days.   EXAM: CT ANGIOGRAPHY CHEST, ABDOMEN AND PELVIS   TECHNIQUE: Non-contrast CT of the chest was initially obtained.   Multidetector CT imaging through the chest, abdomen and pelvis was performed using the standard protocol during bolus administration of intravenous contrast. Multiplanar reconstructed images and MIPs were obtained and reviewed to evaluate the vascular anatomy.   RADIATION DOSE REDUCTION: This exam was performed according to the departmental dose-optimization program which includes  automated exposure control, adjustment of the mA and/or kV according to patient size and/or use of iterative reconstruction technique.   CONTRAST:  OMNIPAQUE IOHEXOL 350 MG/ML SOLN   COMPARISON:  est radiograph earlier today and CT abdomen and pelvis 11/20/2010   FINDINGS: CTA CHEST FINDINGS   Cardiovascular: No acute aortic syndrome in the chest. Aortic atherosclerotic calcification. Normal heart size. No pericardial effusion. Coronary artery calcification. No pulmonary embolism.   Mediastinum/Nodes: Trachea and esophagus are unremarkable. No thoracic adenopathy.   Lungs/Pleura: Centrilobular and paraseptal emphysema greatest in the upper lobes. Reticular and ground-glass opacities with a peripheral and lower lung predominance. Suggestion of honeycombing in the posterior right lower lobe. Associated bronchiolectasis. No focal pneumonia. No pleural effusion or pneumothorax.   Musculoskeletal: No acute fracture.   Review of the MIP images confirms the above findings.   CTA ABDOMEN AND PELVIS FINDINGS   VASCULAR   Aorta: Scattered calcified atherosclerotic plaque. No hemodynamically significant stenosis. No aneurysm or dissection.   Celiac: Scattered calcified atherosclerotic plaque causing moderate narrowing at the origin. No aneurysm or dissection.   SMA: Scattered calcified atherosclerotic plaque. No aneurysm or dissection. No hemodynamically significant stenosis.   Renals: Calcified atherosclerotic plaque causes mild narrowing of both renal arteries. No aneurysm or dissection.   IMA: Patent.   Inflow: Calcified atherosclerotic plaque causes up to mild narrowing. No aneurysm or dissection.   Veins: No obvious venous abnormality within the limitations of this arterial phase Mclean.   Review of the MIP images confirms the above findings.   NON-VASCULAR   Hepatobiliary: No acute abnormality.   Pancreas: Unremarkable.   Spleen: Unremarkable.    Adrenals/Urinary Tract: Normal adrenal glands. No urinary calculi or hydronephrosis. Unremarkable bladder.  Stomach/Bowel: No bowel obstruction or bowel wall thickening. Stomach and appendix are within normal limits.   Lymphatic: No lymphadenopathy.   Reproductive: No acute abnormality.  Prosthetic right testicle.   Other: No free intraperitoneal fluid or air.   Musculoskeletal: No acute fracture.   Review of the MIP images confirms the above findings.   IMPRESSION: 1. No acute aortic syndrome. 2. No acute abnormality in the chest, abdomen, or pelvis. 3. Emphysema and interstitial lung disease.   Aortic Atherosclerosis (ICD10-I70.0) and Emphysema (ICD10-J43.9).     Electronically Signed   By: Minerva Fester M.D.   On: 01/22/2024 21:07   CLINICAL DATA:  Chest pain since yesterday, bilateral arm pain   EXAM: CHEST - 2 VIEW   COMPARISON:  06/15/2018   FINDINGS: Frontal and lateral views of the chest demonstrate an unremarkable cardiac silhouette. Stable eventration of the right hemidiaphragm. No acute airspace disease, effusion, or pneumothorax. No acute bony abnormalities.   IMPRESSION: 1. No acute intrathoracic process.     Electronically Signed   By: Sharlet Salina M.D.   On: 01/22/2024 17:49   I have independently reviewed the above radiologic studies and discussed with the patient   Recent Lab Findings: Lab Results  Component Value Date   WBC 6.8 03/01/2024   HGB 12.9 (L) 03/01/2024   HCT 38.2 (L) 03/01/2024   PLT 210 03/01/2024   GLUCOSE 187 (H) 03/01/2024   CHOL 192 03/01/2024   TRIG 282 (H) 03/01/2024   HDL 46 03/01/2024   LDLCALC 90 03/01/2024   ALT 18 03/01/2024   AST 23 03/01/2024   NA 134 (L) 03/01/2024   K 3.5 03/01/2024   CL 103 03/01/2024   CREATININE 1.10 03/01/2024   BUN 15 03/01/2024   CO2 21 (L) 03/01/2024   TSH 2.918 03/01/2024   INR 1.0 02/29/2024   HGBA1C 8.3 (H) 01/22/2024      Assessment / Plan:      - Coronary  artery disease: Very pleasant 78 year old gentleman with known history of longstanding coronary artery disease status post multiple PCI's now presents with progression of three-vessel coronary artery disease with an 80% distal left main coronary artery stenosis.  Left ventricular function is well-preserved by ventriculogram.  Echocardiogram is pending.  He appears to be in a suitable candidate for coronary bypass grafting and we agree this would be his best option for the revascularization.  Will tentatively plan for surgery next week after Plavix washout.  Dr. Laneta Simmers will review his history and angiography and comment further.  - Type 2 diabetes mellitus: On metformin and glimepiride at home.  Currently being managed with sliding scale insulin.  Hemoglobin A1c in the last month was 8.3.  - Former tobacco smoker, history of COPD.  Quit smoking over 15 years.  He believes his activity level is not lower extremity behind the angina and fatigue and not much shortness of breath.  - History of hypertension: Controlled on losartan, metoprolol, and amlodipine.  - Dyslipidemia: On atorvastatin 80 mg daily and historically has tolerated this well.   I  spent 30 minutes counseling the patient face to face.   Leary Roca, PA-C  03/01/2024 8:21 AM    Chart reviewed, patient examined, agree with above.  This 78 year old gentleman has a hazy 80% left main trifurcation stenosis and severe three-vessel coronary disease.  He has in-stent restenosis in the RCA as well as diffuse high-grade stenoses throughout the posterior descending and posterolateral branches which are small.  I do not  think it is possible to bypass beyond the stenoses.  The LAD has 60% mid vessel stenosis before a diagonal branch and then probably at least 60% stenosis at the junction of the middle and distal thirds.  Beyond this the vessel is small caliber and not graftable.  The midportion of the vessel is adequate caliber but is  probably intramyocardial.  The left circumflex branches are too small to graft.  The only vessel that I would consider potentially graftable anywhere would be the LAD in the midportion and that would still leave the left circumflex and RCA territories ischemic.  I would not recommend bypass surgery in this circumstance because it would likely have a poor outcome.  I will discuss with interventional cardiology whether high risk PCI of the left main is an option.  I reviewed the catheterization findings with the patient and his family and answered all their questions.  They are obviously disappointed that I do not think surgery is a good option.  Jason Borne, MD

## 2024-03-01 NOTE — Progress Notes (Signed)
 Pt had a chest pain, rates 5 out of 10,PA myron at bedside,BP checked Nitroglycerin given,12 lead EKG shows sinus rhythm,pt state pain resolved after taking nitroglycerin, bp checked again

## 2024-03-01 NOTE — Consult Note (Addendum)
 PHARMACY - ANTICOAGULATION CONSULT NOTE  Pharmacy Consult for heparin infusion Indication: chest pain/ACS  No Known Allergies  Vital Signs: Temp: 97.5 F (36.4 C) (04/10 1929) Temp Source: Oral (04/10 1929) BP: 147/70 (04/10 1929) Pulse Rate: 74 (04/10 1507)  Labs: Recent Labs    02/29/24 1428 02/29/24 1918 03/01/24 0116 03/01/24 1127 03/01/24 2027  HGB 13.7  --  12.9*  --   --   HCT 39.7  --  38.2*  --   --   PLT 202  --  210  --   --   APTT 59*  --   --   --   --   LABPROT 12.9  --   --   --   --   INR 1.0  --   --   --   --   HEPARINUNFRC  --    < > >1.10* <0.10* >1.10*  CREATININE  --   --  1.10  --   --    < > = values in this interval not displayed.    Estimated Creatinine Clearance: 67.5 mL/min (by C-G formula based on SCr of 1.1 mg/dL).   Medical History: Past Medical History:  Diagnosis Date   Coronary artery disease    s/p three coronary stents   Diabetes mellitus without complication (HCC)    Hypercholesterolemia    Hypertension    Hypothyroidism    Testicular cancer (HCC)     Medications:  No home anticoagulants per pharmacist review  Assessment:  78 yo male presented for elective LHC.  Patient found to have significant left main disease.    Heparin level tonight came back undetectably high (>1.1), on 1000 units/hr. No s/sx of bleeding or infusion issues. Per notes not likely surgical candidate. Hep in L hand, drawn below - restricted access from cath.   Goal of Therapy:  Heparin level 0.3-0.7 units/ml Monitor platelets by anticoagulation protocol: Yes   Plan:  -Hold heparin infusion x1 hour then reduce heparin to 900 units/hr  -Heparin level in 8 hrs after restart  -Daily heparin level and CBC -F/u duration of heparin infusion  Thank you for allowing pharmacy to participate in this patient's care,  Sherron Monday, PharmD, BCCCP Clinical Pharmacist  Phone: [phone] 03/01/2024 9:22 PM  Please check AMION for all [facility] phone  numbers After 10:00 PM, call Main Pharmacy [phone]

## 2024-03-01 NOTE — Discharge Summary (Addendum)
   Discharge Summary     Patient ID: Jason Mclean. MRN: [id number] DOB/AGE: [birth year] 78 y.o.  Admit date: 02/29/2024 Discharge date: 03/01/2024  Primary Discharge Diagnosis Coronary artery disease Secondary Discharge Diagnosis S/p coronary catheterization  Significant Diagnostic Studies: LHC  Consults: None  Hospital Course: The patient was brought to the cardiac cath lab and underwent outpatient left heart catheterization and coronary angiography with Dr. Darrold Junker on 03/01/2023. The patient tolerated with procedure well without complications.  Found to have left main and three-vessel coronary artery disease, with hazy appearing 80% stenosis distal left main, 60% stenosis mid LAD, 75% stenosis small caliber OM1, 75% in-stent restenosis mid RCA, 90% stenosis small caliber RPDA, 80% stenosis small caliber RPL 3. Normal LV function on LV gram. Post-procedurally without significant erythema, tenderness to palpation, or apparent aneurysm. Hospital course was overall uneventful, patient was transferred overnight to [facility] in [place], [place] for CABG evaluation.   Discharge Exam: Blood pressure (!) 147/73, pulse 74, temperature 97.8 F (36.6 C), temperature source Oral, resp. rate 20, height 5\' 9"  (1.753 m), weight 99.5 kg, SpO2 95%.   General: Well appearing male, well nourished, in no acute distress.  HEENT:  Normocephalic and atraumatic. Neck:   No JVD.  Lungs: Normal respiratory effort on room air.  Clear to ascultation bilaterally. Heart: HRRR . Normal S1 and S2 without gallops or murmurs.  Abdomen: non-distended appearing.  Msk: Normal strength and tone for age. Extremities: No peripheral edema. R radial access site without bleeding, significant tenderness to palpation, apparent aneurysm, or significant ecchymosis. Covered with clean gauze and tegaderm dressing.  Neuro: Alert and oriented x3 Psych:  calm and cooperative.   Labs:   Lab Results  Component Value Date    WBC 6.8 03/01/2024   HGB 12.9 (L) 03/01/2024   HCT 38.2 (L) 03/01/2024   MCV 88.6 03/01/2024   PLT 210 03/01/2024    Recent Labs  Lab 03/01/24 0116  NA 134*  K 3.5  CL 103  CO2 21*  BUN 15  CREATININE 1.10  CALCIUM 9.0  PROT 6.9  BILITOT 0.6  ALKPHOS 49  ALT 18  AST 23  GLUCOSE 187*      Radiology: None EKG: None  FOLLOW UP PLANS AND APPOINTMENTS: F/u Dr. Darrold Junker in 1-2 weeks of discharge from [facility]   PLEASE BRING ALL MEDICATIONS WITH YOU TO FOLLOW UP APPOINTMENTS  Time spent with patient: 30  Signed: Gale Journey PA-C 03/01/2024, 3:48 PM [facility] Cardiology

## 2024-03-01 NOTE — H&P (Addendum)
 Cardiology Admission History and Physical   Patient ID: Jason Mclean. MRN: [id number]; DOB: 03-05-1946   Admission date: 02/29/2024  PCP:  Barbette Reichmann, MD   [facility] Providers Cardiologist:  None        Chief Complaint: Chest pain  Patient Profile:   Jason Millett. is a 78 y.o. male with CAD s/p multiple PCIs, DM2, HTN, HLD, hypothyroidism, COPD who is being seen 03/01/2024 for CABG evaluation.  History of Present Illness:   Jason Mclean was recently admitted to [facility] from 3/2-3/3 for chest pain.  He does not have any ACS at that time and was cleared for discharge.  He follows with his cardiologist Dr. Darrold Junker at [place] [facility] who saw the patient on 4/3 and underwent SPECT stress which was positive by symptoms primarily.  He subsequently underwent LHC electively yesterday which revealed extensive CAD most notably involving an 80% stenosis mid-distal LM.  Following this catheterization the patient was instructed to undergo CABG evaluation at [facility] and he was admitted as a direct admit.  He states that his last dose of Plavix was on 4/7.  On admission the patient states that he currently feels fine.  He denies active chest pain, SOB, PND, orthopnea, swelling, N/V/D, urinary changes, syncope, or urinary changes.  On admission VS were afebrile, HR 87, BP 151/83, RR 20 satting 96% on RA.  Labs notable for triglycerides 282, LDL 90, hemoglobin 12.9, sodium 134 and BNP elevated at 103.8.  ECG was nonischemic.  The patient has no active complaints.   Past Medical History:  Diagnosis Date   Coronary artery disease    s/p three coronary stents   Diabetes mellitus without complication (HCC)    Hypercholesterolemia    Hypertension    Hypothyroidism    Testicular cancer [facility])     Past Surgical History:  Procedure Laterality Date   CATARACT EXTRACTION W/PHACO Left 01/27/2022   Procedure: CATARACT EXTRACTION PHACO AND INTRAOCULAR LENS PLACEMENT (IOC) LEFT  DIABETIC 6.17 01:17.1;  Surgeon: Lockie Mola, MD;  Location: [facility];  Service: Ophthalmology;  Laterality: Left;  Diabetic   CATARACT EXTRACTION W/PHACO Right 02/10/2022   Procedure: CATARACT EXTRACTION PHACO AND INTRAOCULAR LENS PLACEMENT (IOC) RIGHT DIABETIC 10.48 01:19.8;  Surgeon: Lockie Mola, MD;  Location: [facility];  Service: Ophthalmology;  Laterality: Right;  Diabetic   CORONARY ANGIOPLASTY WITH STENT PLACEMENT     CORONARY BALLOON ANGIOPLASTY N/A 01/29/2020   Procedure: CORONARY STENT INTERVENTION;  Surgeon: Marcina Millard, MD;  Location: [facility] INVASIVE CV LAB;  Service: Cardiovascular;  Laterality: N/A;   CORONARY STENT INTERVENTION N/A 06/16/2018   Procedure: CORONARY STENT INTERVENTION;  Surgeon: Marcina Millard, MD;  Location: [facility] INVASIVE CV LAB;  Service: Cardiovascular;  Laterality: N/A;   LEFT HEART CATH AND CORONARY ANGIOGRAPHY N/A 06/16/2018   Procedure: LEFT HEART CATH AND CORONARY ANGIOGRAPHY;  Surgeon: Marcina Millard, MD;  Location: [facility] INVASIVE CV LAB;  Service: Cardiovascular;  Laterality: N/A;   LEFT HEART CATH AND CORONARY ANGIOGRAPHY Left 01/29/2020   Procedure: LEFT HEART CATH AND CORONARY ANGIOGRAPHY;  Surgeon: Marcina Millard, MD;  Location: [facility] INVASIVE CV LAB;  Service: Cardiovascular;  Laterality: Left;     Medications Prior to Admission: Prior to Admission medications   Medication Sig Start Date End Date Taking? Authorizing Provider  amLODipine (NORVASC) 5 MG tablet Take 5 mg by mouth daily. 11/24/23 11/23/24  [provider]  aspirin EC 81 MG tablet Take 81 mg by mouth daily.  [provider]  atorvastatin (LIPITOR) 80 MG tablet Take 1 tablet (80 mg total) by mouth daily at 6 PM. 06/17/18   Auburn Bilberry, MD  Cholecalciferol (VITAMIN D3) 50 MCG (2000 UT) TABS Take 2,000 Units by mouth daily.    [provider]  clopidogrel (PLAVIX) 75 MG tablet Take 1 tablet (75 mg total) by  mouth daily with breakfast. Patient taking differently: Take 75 mg by mouth at bedtime. 01/30/20   Paraschos, Alexander, MD  Coenzyme Q10 100 MG TABS Take 100 mg by mouth every other day.     [provider]  gabapentin (NEURONTIN) 100 MG capsule Take 100 mg by mouth at bedtime. 02/21/23   [provider]  glimepiride (AMARYL) 4 MG tablet Take 4 mg by mouth daily with breakfast. 12/26/23   [provider]  isosorbide mononitrate (IMDUR) 30 MG 24 hr tablet Take 1 tablet (30 mg total) by mouth daily. 01/24/24   Arnetha Courser, MD  levothyroxine (SYNTHROID, LEVOTHROID) 75 MCG tablet Take 75 mcg by mouth daily before breakfast.  03/28/18   [provider]  losartan (COZAAR) 50 MG tablet Take 50 mg by mouth daily. 05/29/18   [provider]  metFORMIN (GLUCOPHAGE) 1000 MG tablet Take 1,000 mg by mouth at bedtime. 01/06/24 01/05/25  [provider]  metoprolol succinate (TOPROL-XL) 50 MG 24 hr tablet Take 1 tablet (50 mg total) by mouth daily. Take with or immediately following a meal. 01/24/24   Arnetha Courser, MD  Multiple Vitamin (MULTIVITAMIN WITH MINERALS) TABS tablet Take 1 tablet by mouth daily. Centrum    [provider]  nitroGLYCERIN (NITROSTAT) 0.4 MG SL tablet Place 1 tablet (0.4 mg total) under the tongue every 5 (five) minutes x 3 doses as needed for chest pain. 01/23/24   Arnetha Courser, MD  pantoprazole (PROTONIX) 40 MG tablet Take 40 mg by mouth daily. 11/24/23 11/23/24  [provider]  TURMERIC PO Take by mouth.    [provider]     Allergies:   No Known Allergies  Social History:   Social History   Socioeconomic History   Marital status: Divorced    Spouse name: Not on file   Number of children: Not on file   Years of education: Not on file   Highest education level: Not on file  Occupational History   Not on file  Tobacco Use   Smoking status: Former    Current packs/day: 0.00    Types: Cigarettes    Quit date:  2014    Years since quitting: 11.2   Smokeless tobacco: Never  Vaping Use   Vaping status: Never Used  Substance and Sexual Activity   Alcohol use: Not Currently   Drug use: Never   Sexual activity: Not on file  Other Topics Concern   Not on file  Social History Narrative   Not on file   Social Drivers of Health   Financial Resource Strain: Low Risk  (11/24/2023)   Received from [facility]   Overall Financial Resource Strain (CARDIA)    Difficulty of Paying Living Expenses: Not hard at all  Food Insecurity: No Food Insecurity (02/29/2024)   Hunger Vital Sign    Worried About Running Out of Food in the Last Year: Never true    Ran Out of Food in the Last Year: Never true  Transportation Needs: No Transportation Needs (02/29/2024)   PRAPARE - Administrator, Civil Service (Medical): No  Lack of Transportation (Non-Medical): No  Physical Activity: Not on file  Stress: Not on file  Social Connections: Not on file  Intimate Partner Violence: Not on file    Family History:   The patient's family history includes CAD in his father; Strabismus in his mother; Stroke in his mother.    ROS:  Please see the history of present illness.  All other ROS reviewed and negative.     Physical Exam/Data:   Vitals:   02/29/24 2300  BP: (!) 151/83  Pulse: 87  Resp: 20  Temp: 97.8 F (36.6 C)  TempSrc: Oral  SpO2: 96%   No intake or output data in the 24 hours ending 03/01/24 0007    02/29/2024    8:38 AM 01/22/2024    5:06 PM 04/05/2023    9:21 AM  Last 3 Weights  Weight (lbs) 219 lb 5.7 oz 222 lb 214 lb  Weight (kg) 99.5 kg 100.699 kg 97.07 kg     There is no height or weight on file to calculate BMI.  General:  Well nourished, well developed, in no acute distress HEENT: normal Neck: no JVD Vascular: No carotid bruits; radial pulses 2+ bilaterally   Cardiac:  normal S1, S2; RRR; no murmur  Lungs:  clear to auscultation bilaterally, no wheezing,  rhonchi or rales  Abd: soft, nontender, no hepatomegaly  Ext: no edema Musculoskeletal:  No deformities, BUE and BLE strength normal and equal Skin: warm and dry  Neuro:  CNs 2-12 intact, no focal abnormalities noted Psych:  Normal affect    EKG:  The ECG that was done  was personally reviewed and demonstrates     Relevant CV Studies:   NM Stress 02/23/24:  Impression   1.  Normal left ventricular function 2.  No regional wall motion maladies 3.  Mild inferior apical ischemia   LHC 02/29/24:    Prox RCA to Mid RCA lesion is 30% stenosed.   Mid RCA lesion is 75% stenosed.   Mid RCA to Dist RCA lesion is 60% stenosed.   3rd RPL lesion is 80% stenosed.   RPDA lesion is 90% stenosed.   Mid LAD lesion is 60% stenosed.   Mid LM to Dist LM lesion is 80% stenosed.   1st Mrg lesion is 75% stenosed.   The left ventricular systolic function is normal.   LV end diastolic pressure is normal.   The left ventricular ejection fraction is 50-55% by visual estimate.   1.  Left main and three-vessel coronary artery disease, with hazy appearing 80% stenosis distal left main, 60% stenosis mid LAD, 75% stenosis small caliber OM1, 75% in-stent restenosis mid RCA, 90% stenosis small caliber RPDA, 80% stenosis small caliber RPL 3 2.  Normal left ventricular function 3.  Unstable angina   Recommendations   1.  Transfer to [facility] for CABG 2.  Hold clopidogrel 3.  Start heparin drip   Laboratory Data:  High Sensitivity Troponin:  No results for input(s): "TROPONINIHS" in the last 720 hours.    ChemistryNo results for input(s): "NA", "K", "CL", "CO2", "GLUCOSE", "BUN", "CREATININE", "CALCIUM", "MG", "GFRNONAA", "GFRAA", "ANIONGAP" in the last 168 hours.  No results for input(s): "PROT", "ALBUMIN", "AST", "ALT", "ALKPHOS", "BILITOT" in the last 168 hours. Lipids No results for input(s): "CHOL", "TRIG", "HDL", "LABVLDL", "LDLCALC", "CHOLHDL" in the last 168 hours. Hematology Recent Labs  Lab  02/29/24 1428  WBC 7.0  RBC 4.54  HGB 13.7  HCT 39.7  MCV 87.4  MCH 30.2  MCHC 34.5  RDW 13.8  PLT 202   Thyroid No results for input(s): "TSH", "FREET4" in the last 168 hours. BNPNo results for input(s): "BNP", "PROBNP" in the last 168 hours.  DDimer No results for input(s): "DDIMER" in the last 168 hours.   Radiology/Studies:  CARDIAC CATHETERIZATION Result Date: 02/29/2024   Prox RCA to Mid RCA lesion is 30% stenosed.   Mid RCA lesion is 75% stenosed.   Mid RCA to Dist RCA lesion is 60% stenosed.   3rd RPL lesion is 80% stenosed.   RPDA lesion is 90% stenosed.   Mid LAD lesion is 60% stenosed.   Mid LM to Dist LM lesion is 80% stenosed.   1st Mrg lesion is 75% stenosed.   The left ventricular systolic function is normal.   LV end diastolic pressure is normal.   The left ventricular ejection fraction is 50-55% by visual estimate. 1.  Left main and three-vessel coronary artery disease, with hazy appearing 80% stenosis distal left main, 60% stenosis mid LAD, 75% stenosis small caliber OM1, 75% in-stent restenosis mid RCA, 90% stenosis small caliber RPDA, 80% stenosis small caliber RPL 3 2.  Normal left ventricular function 3.  Unstable angina Recommendations 1.  Transfer to [facility] for CABG 2.  Hold clopidogrel 3.  Start heparin drip     Assessment and Plan:   Jason Matuszak. is a 78 y.o. male with CAD s/p multiple PCIs, DM2, HTN, HLD, hypothyroidism, COPD who is being seen 03/01/2024 for CABG evaluation.  #Severe Multivessel CAD :: Patient underwent LHC yesterday revealing severe LM stenosis as well as other severe stenoses in multiple vessels warranting CABG evaluation.  His last dose of Plavix he reports was on Monday 4/7 p.m.  He is currently very stable and I will continue him on heparin pending possible CABG. - Speak with CT surgery regarding CABG candidacy - Complete echo for EF assessment prior to possible CABG - Carotid U/S  - Continue heparin per pharmacy - Continue aspirin 81  mg daily - Hold home Plavix pending possible surgery  #HTN - Continue home losartan 50 mg daily - Continue amlodipine 5 mg daily - Continue metoprolol succinate 50 Mg daily  #HLD - Continue atorvastatin 80 mg daily - Start Zetia 10 mg daily for persistent elevated LDL - May need icosapent ethyl in the future if triglycerides remain high  #DMII - Hold home glimepiride and metformin - Start SSI  #Hypothyroidism - Continue home Synthroid   Risk Assessment/Risk Scores:          Code Status: Full Code  Severity of Illness: The appropriate patient status for this patient is INPATIENT. Inpatient status is judged to be reasonable and necessary in order to provide the required intensity of service to ensure the patient's safety. The patient's presenting symptoms, physical exam findings, and initial radiographic and laboratory data in the context of their chronic comorbidities is felt to place them at high risk for further clinical deterioration. Furthermore, it is not anticipated that the patient will be medically stable for discharge from the hospital within 2 midnights of admission.   * I certify that at the point of admission it is my clinical judgment that the patient will require inpatient hospital care spanning beyond 2 midnights from the point of admission due to high intensity of service, high risk for further deterioration and high frequency of surveillance required.*   For questions or updates, please contact [facility] Please consult [email or web address] for contact info under  Signed, Karl Ito, MD  03/01/2024 12:07 AM

## 2024-03-01 NOTE — Progress Notes (Addendum)
   Patient Name: Jason Mclean. Date of Encounter: 03/01/2024 [facility] Cardiologist: None   Interval Summary  .    Mild chest discomfort while walking back from the bathroom. Otherwise doing well. Wife at the bedside.   Vital Signs .    Vitals:   03/01/24 0950 03/01/24 0953 03/01/24 1000 03/01/24 1129  BP: (!) 113/101 (!) 167/65 136/72 (!) 153/68  Pulse:  89 80 73  Resp:  20 18 18   Temp:    97.7 F (36.5 C)  TempSrc:    Oral  SpO2:  97% 96% 96%  Weight:      Height:        Intake/Output Summary (Last 24 hours) at 03/01/2024 1135 Last data filed at 03/01/2024 0345 Gross per 24 hour  Intake 0 ml  Output --  Net 0 ml      03/01/2024    1:00 AM 02/29/2024    8:38 AM 01/22/2024    5:06 PM  Last 3 Weights  Weight (lbs) 219 lb 5.7 oz 219 lb 5.7 oz 222 lb  Weight (kg) 99.5 kg 99.5 kg 100.699 kg      Telemetry/ECG    Sinus Rhythm - Personally Reviewed  Physical Exam .    GEN: No acute distress.   Neck: No JVD Cardiac: RRR, no murmurs, rubs, or gallops.  Respiratory: Clear to auscultation bilaterally. GI: Soft, nontender, non-distended  MS: No edema  Assessment & Plan .     78 y.o. male with CAD s/p multiple PCIs, DM2, HTN, HLD, hypothyroidism, COPD who was seen 03/01/2024 for CABG evaluation.   CAD  -- underwent cardiac cath 4/9 with severe LM stenosis along with 60% stenosis mid LAD, 75% stenosis small caliber OM1, 75% in-stent restenosis mid RCA, 90% stenosis small caliber RPDA, 80% stenosis small caliber RPL 3  -- he was transferred to [facility] for further evaluation and consideration of CABG. Potentially scheduled for next week after plavix washout -- echo 4/10 with LVEF of 60-65%, g1DD, no rWMA, or significant valvular disease  -- mild chest pain with ambulation to the bathroom which resolved with SL NTG but otherwise stable. Will add nitropaste  -- continue IV heparin, ASA, statin   Hypertension -- continue losartan 50 mg daily, amlodipine 5 mg daily,  metoprolol succinate 50 Mg daily  HLD -- LDL 90, HDL 46, Trig 282 -- Continue atorvastatin 80 mg daily, Zetia 10 mg daily   DM -- Hgb A1c 8.3 (01/2024) -- on SSI  Hypothyroidism -- continue synthroid   For questions or updates, please contact [facility] Please consult [email or web address] for contact info under        Signed, Laverda Page, NP   Patient seen and examined. Agree with assessment and plan.  As I went to see the patient today, Dr. Laneta Simmers was in the room.  He feels the patient's anatomy is not suitable for CABG revascularization due to diffuse disease very small caliber vessels and the only place in the LAD that the graft can be inserted would be near the apex where vessel is very small.  Consequently, he does not feel CABG is an option.  As result, will not washout Plavix presently.  Will need to review angiogram with colleagues.  Very difficult situation.  Will add nitrate therapy to the patient's amlodipine and metoprolol succinate.  Resume Plavix.   Lennette Bihari, MD, [facility] 03/01/2024 1:48 PM

## 2024-03-01 NOTE — Plan of Care (Signed)
  Problem: Education: Goal: Knowledge of General Education information will improve Description: Including pain rating scale, medication(s)/side effects and non-pharmacologic comfort measures Outcome: Not Progressing   Problem: Health Behavior/Discharge Planning: Goal: Ability to manage health-related needs will improve Outcome: Not Progressing   Problem: Clinical Measurements: Goal: Ability to maintain clinical measurements within normal limits will improve Outcome: Not Progressing Goal: Will remain free from infection Outcome: Not Progressing Goal: Diagnostic test results will improve Outcome: Not Progressing Goal: Respiratory complications will improve Outcome: Not Progressing Goal: Cardiovascular complication will be avoided Outcome: Not Progressing   Problem: Activity: Goal: Risk for activity intolerance will decrease Outcome: Not Progressing   Problem: Nutrition: Goal: Adequate nutrition will be maintained Outcome: Not Progressing   Problem: Coping: Goal: Level of anxiety will decrease Outcome: Not Progressing   Problem: Elimination: Goal: Will not experience complications related to bowel motility Outcome: Not Progressing Goal: Will not experience complications related to urinary retention Outcome: Not Progressing   Problem: Pain Managment: Goal: General experience of comfort will improve and/or be controlled Outcome: Not Progressing   Problem: Safety: Goal: Ability to remain free from injury will improve Outcome: Not Progressing   Problem: Skin Integrity: Goal: Risk for impaired skin integrity will decrease Outcome: Not Progressing   Problem: Education: Goal: Understanding of cardiac disease, CV risk reduction, and recovery process will improve Outcome: Not Progressing Goal: Individualized Educational Video(s) Outcome: Not Progressing   Problem: Activity: Goal: Ability to tolerate increased activity will improve Outcome: Not Progressing   Problem:  Cardiac: Goal: Ability to achieve and maintain adequate cardiovascular perfusion will improve Outcome: Not Progressing   Problem: Health Behavior/Discharge Planning: Goal: Ability to safely manage health-related needs after discharge will improve Outcome: Not Progressing   Problem: Education: Goal: Ability to describe self-care measures that may prevent or decrease complications (Diabetes Survival Skills Education) will improve Outcome: Not Progressing Goal: Individualized Educational Video(s) Outcome: Not Progressing   Problem: Coping: Goal: Ability to adjust to condition or change in health will improve Outcome: Not Progressing   Problem: Fluid Volume: Goal: Ability to maintain a balanced intake and output will improve Outcome: Not Progressing   Problem: Health Behavior/Discharge Planning: Goal: Ability to identify and utilize available resources and services will improve Outcome: Not Progressing Goal: Ability to manage health-related needs will improve Outcome: Not Progressing   Problem: Metabolic: Goal: Ability to maintain appropriate glucose levels will improve Outcome: Not Progressing   Problem: Nutritional: Goal: Maintenance of adequate nutrition will improve Outcome: Not Progressing Goal: Progress toward achieving an optimal weight will improve Outcome: Not Progressing   Problem: Skin Integrity: Goal: Risk for impaired skin integrity will decrease Outcome: Not Progressing   Problem: Tissue Perfusion: Goal: Adequacy of tissue perfusion will improve Outcome: Not Progressing

## 2024-03-01 NOTE — Consult Note (Addendum)
 PHARMACY - ANTICOAGULATION CONSULT NOTE  Pharmacy Consult for heparin infusion Indication: chest pain/ACS  No Known Allergies  Vital Signs: Temp: 97.7 F (36.5 C) (04/10 1129) Temp Source: Oral (04/10 1129) BP: 153/68 (04/10 1129) Pulse Rate: 73 (04/10 1129)  Labs: Recent Labs    02/29/24 1428 02/29/24 1918 03/01/24 0116 03/01/24 1127  HGB 13.7  --  12.9*  --   HCT 39.7  --  38.2*  --   PLT 202  --  210  --   APTT 59*  --   --   --   LABPROT 12.9  --   --   --   INR 1.0  --   --   --   HEPARINUNFRC  --  >1.10* >1.10* <0.10*  CREATININE  --   --  1.10  --     Estimated Creatinine Clearance: 67.5 mL/min (by C-G formula based on SCr of 1.1 mg/dL).   Medical History: Past Medical History:  Diagnosis Date   Coronary artery disease    s/p three coronary stents   Diabetes mellitus without complication (HCC)    Hypercholesterolemia    Hypertension    Hypothyroidism    Testicular cancer (HCC)     Medications:  No home anticoagulants per pharmacist review  Assessment: 78 yo male presented for elective LHC.  Patient found to have significant left main disease.    -Heparin level undetectable after infusion hold and decrease to 800 units/hr; per RN there was a 15 minute interruption in the infusion earlier today -plans are noted for likely CABG next week  Goal of Therapy:  Heparin level 0.3-0.7 units/ml Monitor platelets by anticoagulation protocol: Yes   Plan:  -Increase heparin to 1000 units/hr -Heparin level in 8 hrs -Daily heparin level and CBC  Harland German, PharmD Clinical Pharmacist **Pharmacist phone directory can now be found on [email or web address] ([email or web address]).  Listed under [facility].

## 2024-03-01 NOTE — Progress Notes (Signed)
 VASCULAR LAB    Pre CABG Dopplers have been performed.  See CV proc for preliminary results.   Oseas Detty, RVT 03/01/2024, 12:47 PM

## 2024-03-02 DIAGNOSIS — I2510 Atherosclerotic heart disease of native coronary artery without angina pectoris: Secondary | ICD-10-CM

## 2024-03-02 LAB — GLUCOSE, CAPILLARY
Glucose-Capillary: 128 mg/dL — ABNORMAL HIGH (ref 70–99)
Glucose-Capillary: 167 mg/dL — ABNORMAL HIGH (ref 70–99)
Glucose-Capillary: 175 mg/dL — ABNORMAL HIGH (ref 70–99)
Glucose-Capillary: 170 mg/dL — ABNORMAL HIGH (ref 70–99)

## 2024-03-02 LAB — CBC
HCT: 35.8 % — ABNORMAL LOW (ref 39.0–52.0)
Hemoglobin: 12.1 g/dL — ABNORMAL LOW (ref 13.0–17.0)
MCH: 30 pg (ref 26.0–34.0)
MCHC: 33.8 g/dL (ref 30.0–36.0)
MCV: 88.8 fL (ref 80.0–100.0)
Platelets: 200 10*3/uL (ref 150–400)
RBC: 4.03 MIL/uL — ABNORMAL LOW (ref 4.22–5.81)
RDW: 14 % (ref 11.5–15.5)
WBC: 7 10*3/uL (ref 4.0–10.5)
nRBC: 0 % (ref 0.0–0.2)

## 2024-03-02 LAB — HEPARIN LEVEL (UNFRACTIONATED)
Heparin Unfractionated: >1.1 [IU]/mL — ABNORMAL HIGH (ref 0.30–0.70)
Heparin Unfractionated: <0.1 [IU]/mL — ABNORMAL LOW (ref 0.30–0.70)

## 2024-03-02 MED ORDER — ISOSORBIDE MONONITRATE ER 60 MG PO TB24
60.0000 mg | ORAL_TABLET | Freq: Every day | ORAL | Status: DC
  Administered 2024-03-02 – 2024-03-03 (×2): 60 mg via ORAL
  Filled 2024-03-02 (×2): qty 1

## 2024-03-02 MED ORDER — METOPROLOL SUCCINATE ER 50 MG PO TB24
75.0000 mg | ORAL_TABLET | Freq: Every day | ORAL | Status: DC
  Administered 2024-03-02 – 2024-03-03 (×2): 75 mg via ORAL
  Filled 2024-03-02 (×2): qty 1

## 2024-03-02 MED ORDER — HEPARIN BOLUS VIA INFUSION
2000.0000 [IU] | Freq: Once | INTRAVENOUS | Status: AC
  Administered 2024-03-02: 2000 [IU] via INTRAVENOUS
  Filled 2024-03-02: qty 2000

## 2024-03-02 NOTE — Progress Notes (Signed)
 Mobility Specialist Progress Note:    03/02/24 1127  Mobility  Activity Ambulated independently in room;Ambulated independently in hallway  Level of Assistance Independent after set-up  Assistive Device None  Distance Ambulated (ft) 470 ft  Activity Response Tolerated well  Mobility Referral Yes  Mobility visit 1 Mobility  Mobility Specialist Start Time (ACUTE ONLY) 1123  Mobility Specialist Stop Time (ACUTE ONLY) 1137  Mobility Specialist Time Calculation (min) (ACUTE ONLY) 14 min   Pt received in chair, family in room. Agreeable to mobility session. Ambulated in hallway, no AD required. Tolerated well, asx throughout. Returned pt to room, all needs met, call bell in reach.    Feliciana Rossetti Mobility Specialist Please contact via Special educational needs teacher or  Rehab office at [phone]

## 2024-03-02 NOTE — Consult Note (Signed)
 PHARMACY - ANTICOAGULATION CONSULT NOTE  Pharmacy Consult for heparin infusion Indication: chest pain/ACS  No Known Allergies  Vital Signs: Temp: 97.5 F (36.4 C) (04/11 0736) Temp Source: Oral (04/11 0736) BP: 158/67 (04/11 0945) Pulse Rate: 76 (04/11 0945)  Labs: Recent Labs    02/29/24 1428 02/29/24 1918 03/01/24 0116 03/01/24 1127 03/01/24 2027 03/02/24 0858 03/02/24 0938  HGB 13.7  --  12.9*  --   --  12.1*  --   HCT 39.7  --  38.2*  --   --  35.8*  --   PLT 202  --  210  --   --  200  --   APTT 59*  --   --   --   --   --   --   LABPROT 12.9  --   --   --   --   --   --   INR 1.0  --   --   --   --   --   --   HEPARINUNFRC  --    < > >1.10* <0.10* >1.10*  --  >1.10*  CREATININE  --   --  1.10  --   --   --   --    < > = values in this interval not displayed.    Estimated Creatinine Clearance: 67.5 mL/min (by C-G formula based on SCr of 1.1 mg/dL).   Medical History: Past Medical History:  Diagnosis Date   Coronary artery disease    s/p three coronary stents   Diabetes mellitus without complication (HCC)    Hypercholesterolemia    Hypertension    Hypothyroidism    Testicular cancer (HCC)     Medications:  No home anticoagulants per pharmacist review  Assessment:  78 yo male presented for elective LHC.  Patient found to have significant left main disease.    -Heparin remains undetectably high (>1.1), on 900 units/hr. CBC stable -lab was collected on the same arm of the heparin infusion (possible lab error) -plans noted for likely transfer to [facility] for surgery or PCI   Goal of Therapy:  Heparin level 0.3-0.7 units/ml Monitor platelets by anticoagulation protocol: Yes   Plan:  -Continue heparin at 900 units/hr -Rn to change site of heparin infusion -Will recheck heparin level later today  Harland German, PharmD Clinical Pharmacist **Pharmacist phone directory can now be found on [email or web address] ([email or web address]).  Listed under [facility].

## 2024-03-02 NOTE — Consult Note (Signed)
 PHARMACY - ANTICOAGULATION CONSULT NOTE  Pharmacy Consult for heparin infusion Indication: chest pain/ACS  No Known Allergies  Vital Signs: Temp: 97.3 F (36.3 C) (04/11 1648) Temp Source: Oral (04/11 1648) BP: 126/67 (04/11 1648) Pulse Rate: 80 (04/11 1648)  Labs: Recent Labs    02/29/24 1428 02/29/24 1918 03/01/24 0116 03/01/24 1127 03/01/24 2027 03/02/24 0858 03/02/24 0938 03/02/24 1528  HGB 13.7  --  12.9*  --   --  12.1*  --   --   HCT 39.7  --  38.2*  --   --  35.8*  --   --   PLT 202  --  210  --   --  200  --   --   APTT 59*  --   --   --   --   --   --   --   LABPROT 12.9  --   --   --   --   --   --   --   INR 1.0  --   --   --   --   --   --   --   HEPARINUNFRC  --    < > >1.10*   < > >1.10*  --  >1.10* <0.10*  CREATININE  --   --  1.10  --   --   --   --   --    < > = values in this interval not displayed.    Estimated Creatinine Clearance: 67.5 mL/min (by C-G formula based on SCr of 1.1 mg/dL).   Medical History: Past Medical History:  Diagnosis Date   Coronary artery disease    s/p three coronary stents   Diabetes mellitus without complication (HCC)    Hypercholesterolemia    Hypertension    Hypothyroidism    Testicular cancer (HCC)     Medications:  No home anticoagulants per pharmacist review  Assessment:  78 yo male presented for elective LHC.  Patient found to have significant left main disease.    Heparin level undetectable on infusion at 900 units/hr. Lab was collected correctly. No issues with line or bleeding reported per RN.  Goal of Therapy:  Heparin level 0.3-0.7 units/ml Monitor platelets by anticoagulation protocol: Yes   Plan:  Rebolus heparin 2000 units Increase heparin infusion to 1150 units/hr F/u 8 hr heparin level  Christoper Fabian, PharmD, BCPS Please see amion for complete clinical pharmacist phone list 03/02/2024 5:05 PM

## 2024-03-02 NOTE — Progress Notes (Addendum)
 Rounding Note    Patient Name: Jason Mclean. Date of Encounter: 03/02/2024  [facility] Cardiologist:  Dr. Cassie Fre[facility])  Subjective   Patient had mild chest pain this morning, relieved with sublingual nitroglycerin.  Inpatient Medications    Scheduled Meds:  amLODipine  5 mg Oral Daily   aspirin EC  81 mg Oral Daily   atorvastatin  80 mg Oral q1800   cholecalciferol  2,000 Units Oral Daily   clopidogrel  75 mg Oral Daily   ezetimibe  10 mg Oral Daily   gabapentin  100 mg Oral QHS   insulin aspart  0-8 Units Subcutaneous TID WC   isosorbide mononitrate  30 mg Oral Daily   levothyroxine  75 mcg Oral QAC breakfast   losartan  50 mg Oral Daily   metoprolol succinate  50 mg Oral Daily   multivitamin with minerals  1 tablet Oral Daily   pantoprazole  40 mg Oral Daily   senna  2 tablet Oral Daily   Continuous Infusions:  heparin 900 Units/hr (03/01/24 2258)   PRN Meds: acetaminophen, nitroGLYCERIN, ondansetron (ZOFRAN) IV   Vital Signs    Vitals:   03/01/24 1929 03/01/24 2314 03/02/24 0300 03/02/24 0736  BP: (!) 147/70 (!) 147/75 (!) 112/53 (!) 140/75  Pulse:  73 71 76  Resp: 20 20 14 15   Temp: (!) 97.5 F (36.4 C) (!) 97.5 F (36.4 C) (!) 97.5 F (36.4 C) (!) 97.5 F (36.4 C)  TempSrc: Oral Oral Oral Oral  SpO2: 96% 96% 92% 96%  Weight:      Height:        Intake/Output Summary (Last 24 hours) at 03/02/2024 0828 Last data filed at 03/02/2024 0744 Gross per 24 hour  Intake 1249.02 ml  Output 500 ml  Net 749.02 ml      03/01/2024    1:00 AM 02/29/2024    8:38 AM 01/22/2024    5:06 PM  Last 3 Weights  Weight (lbs) 219 lb 5.7 oz 219 lb 5.7 oz 222 lb  Weight (kg) 99.5 kg 99.5 kg 100.699 kg      Telemetry    Sinus rhythm in the 70s- Personally Reviewed  ECG    03/02/2024 ECG (independently read by me): Normal sinus rhythm at 73 bpm.  PR interval 202 ms.  Poor R wave progression V1 through V3.  Physical Exam   BP (!) 140/75 (BP  Location: Right Arm)   Pulse 76   Temp (!) 97.5 F (36.4 C) (Oral)   Resp 15   Ht 5\' 9"  (1.753 m)   Wt 99.5 kg   SpO2 96%   BMI 32.39 kg/m  General: Alert, oriented, no distress.  Skin: normal turgor, no rashes, warm and dry HEENT: Normocephalic, atraumatic. Pupils equal round and reactive to light; sclera anicteric; extraocular muscles intact; Nose without nasal septal hypertrophy Mouth/Parynx benign;  Neck: No JVD, no carotid bruits; normal carotid upstroke Lungs: clear to ausculatation and percussion; no wheezing or rales Chest wall: without tenderness to palpitation Heart: PMI not displaced, RRR, s1 s2 normal, 1/6 systolic murmur, no diastolic murmur, no rubs, gallops, thrills, or heaves Abdomen: soft, nontender; no hepatosplenomehaly, BS+; abdominal aorta nontender and not dilated by palpation. Back: no CVA tenderness Pulses 2+ right radial site stable Musculoskeletal: full range of motion, normal strength, no joint deformities Extremities: no clubbing cyanosis or edema, Homan's sign negative  Neurologic: grossly nonfocal; Cranial nerves grossly wnl Psychologic: Normal mood and affect  Labs    High Sensitivity Troponin:  No results for input(s): "TROPONINIHS" in the last 720 hours.   Chemistry Recent Labs  Lab 03/01/24 0116  NA 134*  K 3.5  CL 103  CO2 21*  GLUCOSE 187*  BUN 15  CREATININE 1.10  CALCIUM 9.0  MG 1.8  PROT 6.9  ALBUMIN 3.6  AST 23  ALT 18  ALKPHOS 49  BILITOT 0.6  GFRNONAA >60  ANIONGAP 10    Lipids  Recent Labs  Lab 03/01/24 0116  CHOL 192  TRIG 282*  HDL 46  LDLCALC 90  CHOLHDL 4.2    Hematology Recent Labs  Lab 02/29/24 1428 03/01/24 0116  WBC 7.0 6.8  RBC 4.54 4.31  HGB 13.7 12.9*  HCT 39.7 38.2*  MCV 87.4 88.6  MCH 30.2 29.9  MCHC 34.5 33.8  RDW 13.8 14.1  PLT 202 210   Thyroid  Recent Labs  Lab 03/01/24 0116  TSH 2.918    BNP Recent Labs  Lab 03/01/24 0132  BNP 103.8*    DDimer No results for  input(s): "DDIMER" in the last 168 hours.   Radiology    VAS US DOPPLER PRE CABG Result Date: 03/01/2024 PREOPERATIVE VASCULAR EVALUATION Patient Name:  Kamali Nephew.  Date of Exam:   03/01/2024 Medical Rec #: [id number]         Accession #:    [id number] Date of Birth: [birth year]         Patient Gender: M Patient Age:   63 years Exam Location:  [facility] Procedure:      VAS US DOPPLER PRE CABG Referring Phys: Viviann Spare HENDRICKSON --------------------------------------------------------------------------------  Indications:      Pre-CABG. Risk Factors:     Coronary artery disease (Multiple stents). Hypertension.                   Diabetes. Hyperlipidemia. History of tobacco abuse, quit 2015 Comparison Study: No prior studies on file Performing Technologist: Sherren Kerns RVS  Examination Guidelines: A complete evaluation includes B-mode imaging, spectral Doppler, color Doppler, and power Doppler as needed of all accessible portions of each vessel. Bilateral testing is considered an integral part of a complete examination. Limited examinations for reoccurring indications may be performed as noted.  Right Carotid Findings: +----------+--------+--------+--------+------------+--------+           PSV cm/sEDV cm/sStenosisDescribe    Comments +----------+--------+--------+--------+------------+--------+ CCA Prox  116     22              heterogenous         +----------+--------+--------+--------+------------+--------+ CCA Distal128     24              heterogenous         +----------+--------+--------+--------+------------+--------+ ICA Prox  123     32      1-39%   heterogenous         +----------+--------+--------+--------+------------+--------+ ICA Mid   120     27              heterogenous         +----------+--------+--------+--------+------------+--------+ ICA Distal80      23              heterogenous          +----------+--------+--------+--------+------------+--------+ ECA       139     9               calcific             +----------+--------+--------+--------+------------+--------+ +----------+--------+-------+--------+------------+  PSV cm/sEDV cmsDescribeArm Pressure +----------+--------+-------+--------+------------+ [id number]                                 +----------+--------+-------+--------+------------+ +---------+--------+--+--------+--+ VertebralPSV cm/s34EDV cm/s13 +---------+--------+--+--------+--+ Left Carotid Findings: +----------+--------+--------+--------+------------+------------------+           PSV cm/sEDV cm/sStenosisDescribe    Comments           +----------+--------+--------+--------+------------+------------------+ CCA Prox  108     22                          intimal thickening +----------+--------+--------+--------+------------+------------------+ CCA Distal104     21                          intimal thickening +----------+--------+--------+--------+------------+------------------+ ICA Prox  85      23      1-39%   heterogenous                   +----------+--------+--------+--------+------------+------------------+ ICA Mid   105     28              heterogenous                   +----------+--------+--------+--------+------------+------------------+ ICA Distal79      27              heterogenous                   +----------+--------+--------+--------+------------+------------------+ ECA       178     23              calcific                       +----------+--------+--------+--------+------------+------------------+  +----------+--------+--------+--------+------------+ SubclavianPSV cm/sEDV cm/sDescribeArm Pressure +----------+--------+--------+--------+------------+           132                                  +----------+--------+--------+--------+------------+  +---------+--------+--+--------+--+ VertebralPSV cm/s79EDV cm/s17 +---------+--------+--+--------+--+  ABI Findings: +------------------+-----+---------+----------+ Rt Pressure (mmHg)IndexWaveform Comment    +------------------+-----+---------+----------+                        triphasicrestricted +------------------+-----+---------+----------+ 151               1.01 biphasic            +------------------+-----+---------+----------+ 151               1.01 biphasic            +------------------+-----+---------+----------+ 100               0.67 Normal              +------------------+-----+---------+----------+ +------------------+-----+---------+ Lt Pressure (mmHg)IndexWaveform  +------------------+-----+---------+ 149                    triphasic +------------------+-----+---------+ 137               0.92 biphasic  +------------------+-----+---------+ 64                0.43 biphasic  +------------------+-----+---------+ 43                0.29 Abnormal  +------------------+-----+---------+ +-------+---------------+ ABI/TBIToday's ABI/TBI +-------+---------------+ Right  1.01/0.67       +-------+---------------+  Left   0.92/0.29       +-------+---------------+  Right Doppler Findings: +--------+---------+----------+ Site    Doppler  Comments   +--------+---------+----------+ Brachialtriphasicrestricted +--------+---------+----------+ Radial  biphasic            +--------+---------+----------+ Ulnar   triphasic           +--------+---------+----------+  Left Doppler Findings: +--------+--------+---------+ Site    PressureDoppler   +--------+--------+---------+ [id number]     triphasic +--------+--------+---------+ Radial          triphasic +--------+--------+---------+ Ulnar           triphasic +--------+--------+---------+   Summary: Right Carotid: Velocities in the right ICA are consistent with a 1-39% stenosis. Left  Carotid: Velocities in the left ICA are consistent with a 1-39% stenosis. Vertebrals:  Bilateral vertebral arteries demonstrate antegrade flow. Subclavians: Normal flow hemodynamics were seen in bilateral subclavian              arteries. Right ABI: Resting right ankle-brachial index is within normal range. The right toe-brachial index is normal. Left ABI: Resting left ankle-brachial index indicates mild left lower extremity arterial disease. The left toe-brachial index is abnormal. Right Upper Extremity: Doppler waveforms remain within normal limits with right radial compression. Doppler waveforms remain within normal limits with right ulnar compression. Left Upper Extremity: Doppler waveforms remain within normal limits with left radial compression. Doppler waveform obliterate with left ulnar compression.  Electronically signed by Carolynn Sayers on 03/01/2024 at 3:04:24 PM.    Final    ECHOCARDIOGRAM COMPLETE Result Date: 03/01/2024    ECHOCARDIOGRAM REPORT   Patient Name:   Cyrus Ramsburg. Date of Exam: 03/01/2024 Medical Rec #:  [id number]        Height:       69.0 in Accession #:    [id number]       Weight:       219.4 lb Date of Birth:  [birth year]        BSA:          2.149 m Patient Age:    77 years         BP:           139/65 mmHg Patient Gender: M                HR:           76 bpm. Exam Location:  Inpatient Procedure: 2D Echo, Color Doppler and Cardiac Doppler (Both Spectral and Color            Flow Doppler were utilized during procedure). Indications:    R07.9* Chest pain, unspecified  History:        Patient has no prior history of Echocardiogram examinations. CAD                 and Previous Myocardial Infarction, COPD; Risk Factors:Diabetes                 and Hypertension.  Sonographer:    Maxwell Marion Referring Phys: [id number] Karl Ito IMPRESSIONS  1. Left ventricular ejection fraction, by estimation, is 65 to 70%. The left ventricle has normal function. The left ventricle has no regional wall  motion abnormalities. There is severe asymmetric left ventricular hypertrophy of the septal segment. Left  ventricular diastolic parameters are consistent with Grade I diastolic dysfunction (impaired relaxation).  2. Right ventricular systolic function is normal. The right ventricular size is normal. Tricuspid regurgitation signal is inadequate for assessing PA pressure.  3.  The mitral valve is normal in structure. No evidence of mitral valve regurgitation. No evidence of mitral stenosis.  4. The aortic valve is normal in structure. Aortic valve regurgitation is not visualized. No aortic stenosis is present.  5. The inferior vena cava is normal in size with greater than 50% respiratory variability, suggesting right atrial pressure of 3 mmHg. FINDINGS  Left Ventricle: Left ventricular ejection fraction, by estimation, is 65 to 70%. The left ventricle has normal function. The left ventricle has no regional wall motion abnormalities. The left ventricular internal cavity size was normal in size. There is  severe asymmetric left ventricular hypertrophy of the septal segment. Left ventricular diastolic parameters are consistent with Grade I diastolic dysfunction (impaired relaxation). Right Ventricle: The right ventricular size is normal. No increase in right ventricular wall thickness. Right ventricular systolic function is normal. Tricuspid regurgitation signal is inadequate for assessing PA pressure. Left Atrium: Left atrial size was normal in size. Right Atrium: Right atrial size was normal in size. Pericardium: There is no evidence of pericardial effusion. Mitral Valve: The mitral valve is normal in structure. No evidence of mitral valve regurgitation. No evidence of mitral valve stenosis. Tricuspid Valve: The tricuspid valve is normal in structure. Tricuspid valve regurgitation is not demonstrated. No evidence of tricuspid stenosis. Aortic Valve: The aortic valve is normal in structure. Aortic valve regurgitation is  not visualized. No aortic stenosis is present. Aortic valve mean gradient measures 5.0 mmHg. Aortic valve peak gradient measures 10.1 mmHg. Aortic valve area, by VTI measures 1.57 cm. Pulmonic Valve: The pulmonic valve was normal in structure. Pulmonic valve regurgitation is not visualized. No evidence of pulmonic stenosis. Aorta: The aortic root is normal in size and structure. Venous: The inferior vena cava is normal in size with greater than 50% respiratory variability, suggesting right atrial pressure of 3 mmHg. IAS/Shunts: No atrial level shunt detected by color flow Doppler.  LEFT VENTRICLE PLAX 2D LVIDd:         4.30 cm     Diastology LVIDs:         4.20 cm     LV e' medial:    5.91 cm/s LV PW:         1.20 cm     LV E/e' medial:  10.7 LV IVS:        1.70 cm     LV e' lateral:   7.30 cm/s LVOT diam:     2.00 cm     LV E/e' lateral: 8.6 LV SV:         51 LV SV Index:   24 LVOT Area:     3.14 cm  LV Volumes (MOD) LV vol d, MOD A2C: 93.8 ml LV vol d, MOD A4C: 91.6 ml LV vol s, MOD A2C: 22.1 ml LV vol s, MOD A4C: 29.8 ml LV SV MOD A2C:     71.7 ml LV SV MOD A4C:     91.6 ml LV SV MOD BP:      66.6 ml RIGHT VENTRICLE RV S prime:     15.20 cm/s TAPSE (M-mode): 2.2 cm LEFT ATRIUM             Index LA diam:        3.60 cm 1.68 cm/m LA Vol (A2C):   47.6 ml 22.15 ml/m LA Vol (A4C):   26.9 ml 12.52 ml/m LA Biplane Vol: 38.5 ml 17.92 ml/m  AORTIC VALVE AV Area (Vmax):    1.51 cm AV Area (Vmean):  1.37 cm AV Area (VTI):     1.57 cm AV Vmax:           159.00 cm/s AV Vmean:          104.000 cm/s AV VTI:            0.324 m AV Peak Grad:      10.1 mmHg AV Mean Grad:      5.0 mmHg LVOT Vmax:         76.50 cm/s LVOT Vmean:        45.300 cm/s LVOT VTI:          0.162 m LVOT/AV VTI ratio: 0.50  AORTA Ao Asc diam: 3.10 cm MITRAL VALVE MV Area (PHT): 3.77 cm    SHUNTS MV Decel Time: 201 msec    Systemic VTI:  0.16 m MV E velocity: 63.00 cm/s  Systemic Diam: 2.00 cm MV A velocity: 85.10 cm/s MV E/A ratio:  0.74 Kardie  Tobb DO Electronically signed by Thomasene Ripple DO Signature Date/Time: 03/01/2024/11:32:06 AM    Final    CARDIAC CATHETERIZATION Result Date: 02/29/2024   Prox RCA to Mid RCA lesion is 30% stenosed.   Mid RCA lesion is 75% stenosed.   Mid RCA to Dist RCA lesion is 60% stenosed.   3rd RPL lesion is 80% stenosed.   RPDA lesion is 90% stenosed.   Mid LAD lesion is 60% stenosed.   Mid LM to Dist LM lesion is 80% stenosed.   1st Mrg lesion is 75% stenosed.   The left ventricular systolic function is normal.   LV end diastolic pressure is normal.   The left ventricular ejection fraction is 50-55% by visual estimate. 1.  Left main and three-vessel coronary artery disease, with hazy appearing 80% stenosis distal left main, 60% stenosis mid LAD, 75% stenosis small caliber OM1, 75% in-stent restenosis mid RCA, 90% stenosis small caliber RPDA, 80% stenosis small caliber RPL 3 2.  Normal left ventricular function 3.  Unstable angina Recommendations 1.  Transfer to [facility] for CABG 2.  Hold clopidogrel 3.  Start heparin drip    Cardiac Studies     Prox RCA to Mid RCA lesion is 30% stenosed.   Mid RCA lesion is 75% stenosed.   Mid RCA to Dist RCA lesion is 60% stenosed.   3rd RPL lesion is 80% stenosed.   RPDA lesion is 90% stenosed.   Mid LAD lesion is 60% stenosed.   Mid LM to Dist LM lesion is 80% stenosed.   1st Mrg lesion is 75% stenosed.   The left ventricular systolic function is normal.   LV end diastolic pressure is normal.   The left ventricular ejection fraction is 50-55% by visual estimate.   1.  Left main and three-vessel coronary artery disease, with hazy appearing 80% stenosis distal left main, 60% stenosis mid LAD, 75% stenosis small caliber OM1, 75% in-stent restenosis mid RCA, 90% stenosis small caliber RPDA, 80% stenosis small caliber RPL 3 2.  Normal left ventricular function 3.  Unstable angina   Recommendations   1.  Transfer to [facility] for CABG 2.  Hold clopidogrel 3.  Start heparin drip        ECHO: 03/01/2024  1. Left ventricular ejection fraction, by estimation, is 65 to 70%. The  left ventricle has normal function. The left ventricle has no regional  wall motion abnormalities. There is severe asymmetric left ventricular  hypertrophy of the septal segment. Left   ventricular diastolic parameters are  consistent with Grade I diastolic  dysfunction (impaired relaxation).   2. Right ventricular systolic function is normal. The right ventricular  size is normal. Tricuspid regurgitation signal is inadequate for assessing  PA pressure.   3. The mitral valve is normal in structure. No evidence of mitral valve  regurgitation. No evidence of mitral stenosis.   4. The aortic valve is normal in structure. Aortic valve regurgitation is  not visualized. No aortic stenosis is present.   5. The inferior vena cava is normal in size with greater than 50%  respiratory variability, suggesting right atrial pressure of 3 mmHg.    Surgical evaluation: 03/01/2024 Chart reviewed, patient examined, agree with above.  This 78 year old gentleman has a hazy 80% left main trifurcation stenosis and severe three-vessel coronary disease.  He has in-stent restenosis in the RCA as well as diffuse high-grade stenoses throughout the posterior descending and posterolateral branches which are small.  I do not think it is possible to bypass beyond the stenoses.  The LAD has 60% mid vessel stenosis before a diagonal branch and then probably at least 60% stenosis at the junction of the middle and distal thirds.  Beyond this the vessel is small caliber and not graftable.  The midportion of the vessel is adequate caliber but is probably intramyocardial.  The left circumflex branches are too small to graft.  The only vessel that I would consider potentially graftable anywhere would be the LAD in the midportion and that would still leave the left circumflex and RCA territories ischemic.  I would not recommend bypass  surgery in this circumstance because it would likely have a poor outcome.  I will discuss with interventional cardiology whether high risk PCI of the left main is an option.  I reviewed the catheterization findings with the patient and his family and answered all their questions.  They are obviously disappointed that I do not think surgery is a good option.   Alleen Borne, MD  Patient Profile     78 y.o. male with established CAD dating back to 1999.  He states he is had a total of 6 stents placed in his RCA.  He had recently developed increased anginal symptomatology.  Assessment & Plan    Multivessel CAD: Patient tells me he had his initial RCA stent placed approximately 26 years ago.  He has had multiple stents since and has 6 stents in total in his RCA.  He had developed recurrent chest pain symptomatology 1 month ago and apparently had an abnormal nuclear study resulting in his catheterization at [facility] by Dr. Cassie Freer on February 29, 2024.  Patient was transferred to [facility] for surgical evaluation and CABG.  He was seen by Dr. Laneta Simmers yesterday as noted above who felt the surgical op procedure was suboptimal particularly with the extensive disease in the RCA with small distal vessels and that the LAD which potentially is graftable would potentially fit into the apical LAD portion which is very small caliber or possible the mid LAD which was intramyocardial.  I looked at the films today with interventional colleague.  He has high-grade 80 to 85% left main stenosis which trifurcates into the LAD,  ramus and circumflex system.  As result, intervention would be extremely high risk.  I tried to call Dr. Cassie Freer earlier.  Discussed with the patient.  Options include possibly sending patient to [facility] for further evaluation for CABG revascularization or high risk complex intervention.  Patient did have chest pain this morning on  IV heparin.  Will increase nitrate therapy; continue amlodipine currently  at 5 mg but depending on blood pressure and symptoms may need further titration.  Resting pulse in the mid 70s, increase metoprolol succinate to 75 mg.  At present continue DAPT.  With chest pain this morning will need to stay in the hospital.  Consider further discussion of possible limited CABG to improve LAD territory if at all possible.  Echo Doppler study shows normal LV function with EF 65 to 70% without regional wall motion abnormalities. Hyperlipidemia: Total cholesterol 192, triglycerides 282, VLDL 56 and LDL 90.  Mixed hyperlipidemia panel.  Will need to maximize lipid-lowering therapy and achieve target LDL less than 55 if at all possible.  LP(a) is normal at 26.4.  Patient is on atorvastatin 80 mg and Zetia 10 mg.  Will need to initiate PCSK9 inhibition.   I tried to call Dr. Cassie Freer, no answer.  Will try again later.   Time spent: 60 minutes, reviewing films, reviewing case with Dr. Clifton James and colleagues, and long discussion with patient.  For questions or updates, please contact  [facility] Please consult [email or web address] for contact info under    Signed, Nicki Guadalajara, MD  03/02/2024, 8:28 AM     Addendum: I have subsequently spoken to Dr. Cassie Freer.  He has contacted [facility] and has spoken with Dr. Anette Guarneri at [facility].  Apparently, Dr. Cassie Freer sent him the cath studies for his review.  He said that [facility] would accept the patient that the patient potentially can be transferred today depending upon bed availability.  I had put in a initial call to Dr. Yetta Barre earlier.  He has just gotten back in touch with me.  The plan will be for the patient most likely to be discharged from [facility] tomorrow to go to [facility] once bed is available tomorrow.  Present, will continue Plavix and IV heparin.  Tentative plans would be to attempt  surgery or coronary invention next week  Nicki Guadalajara, MD  11:05 AM

## 2024-03-03 DIAGNOSIS — Z01818 Encounter for other preprocedural examination: Secondary | ICD-10-CM | POA: Diagnosis not present

## 2024-03-03 DIAGNOSIS — J90 Pleural effusion, not elsewhere classified: Secondary | ICD-10-CM | POA: Diagnosis not present

## 2024-03-03 DIAGNOSIS — Z452 Encounter for adjustment and management of vascular access device: Secondary | ICD-10-CM | POA: Diagnosis not present

## 2024-03-03 DIAGNOSIS — E039 Hypothyroidism, unspecified: Secondary | ICD-10-CM | POA: Insufficient documentation

## 2024-03-03 DIAGNOSIS — I10 Essential (primary) hypertension: Secondary | ICD-10-CM | POA: Diagnosis not present

## 2024-03-03 DIAGNOSIS — R918 Other nonspecific abnormal finding of lung field: Secondary | ICD-10-CM | POA: Diagnosis not present

## 2024-03-03 DIAGNOSIS — J9811 Atelectasis: Secondary | ICD-10-CM | POA: Diagnosis not present

## 2024-03-03 DIAGNOSIS — I440 Atrioventricular block, first degree: Secondary | ICD-10-CM | POA: Diagnosis not present

## 2024-03-03 DIAGNOSIS — E785 Hyperlipidemia, unspecified: Secondary | ICD-10-CM | POA: Insufficient documentation

## 2024-03-03 DIAGNOSIS — Z4682 Encounter for fitting and adjustment of non-vascular catheter: Secondary | ICD-10-CM | POA: Diagnosis not present

## 2024-03-03 DIAGNOSIS — I97190 Other postprocedural cardiac functional disturbances following cardiac surgery: Secondary | ICD-10-CM | POA: Diagnosis not present

## 2024-03-03 DIAGNOSIS — E782 Mixed hyperlipidemia: Secondary | ICD-10-CM | POA: Diagnosis not present

## 2024-03-03 DIAGNOSIS — E119 Type 2 diabetes mellitus without complications: Secondary | ICD-10-CM | POA: Diagnosis not present

## 2024-03-03 DIAGNOSIS — I214 Non-ST elevation (NSTEMI) myocardial infarction: Secondary | ICD-10-CM | POA: Diagnosis not present

## 2024-03-03 DIAGNOSIS — E1159 Type 2 diabetes mellitus with other circulatory complications: Secondary | ICD-10-CM | POA: Diagnosis not present

## 2024-03-03 DIAGNOSIS — E871 Hypo-osmolality and hyponatremia: Secondary | ICD-10-CM | POA: Diagnosis not present

## 2024-03-03 DIAGNOSIS — J449 Chronic obstructive pulmonary disease, unspecified: Secondary | ICD-10-CM | POA: Diagnosis not present

## 2024-03-03 DIAGNOSIS — I2510 Atherosclerotic heart disease of native coronary artery without angina pectoris: Secondary | ICD-10-CM | POA: Diagnosis not present

## 2024-03-03 DIAGNOSIS — Z951 Presence of aortocoronary bypass graft: Secondary | ICD-10-CM | POA: Diagnosis not present

## 2024-03-03 DIAGNOSIS — E1165 Type 2 diabetes mellitus with hyperglycemia: Secondary | ICD-10-CM | POA: Diagnosis not present

## 2024-03-03 DIAGNOSIS — D6489 Other specified anemias: Secondary | ICD-10-CM | POA: Diagnosis not present

## 2024-03-03 DIAGNOSIS — Z794 Long term (current) use of insulin: Secondary | ICD-10-CM | POA: Diagnosis not present

## 2024-03-03 DIAGNOSIS — J939 Pneumothorax, unspecified: Secondary | ICD-10-CM | POA: Diagnosis not present

## 2024-03-03 DIAGNOSIS — I25110 Atherosclerotic heart disease of native coronary artery with unstable angina pectoris: Secondary | ICD-10-CM | POA: Diagnosis not present

## 2024-03-03 LAB — GLUCOSE, CAPILLARY
Glucose-Capillary: 129 mg/dL — ABNORMAL HIGH (ref 70–99)
Glucose-Capillary: 199 mg/dL — ABNORMAL HIGH (ref 70–99)

## 2024-03-03 LAB — CBC
HCT: 37 % — ABNORMAL LOW (ref 39.0–52.0)
Hemoglobin: 12.4 g/dL — ABNORMAL LOW (ref 13.0–17.0)
MCH: 29.7 pg (ref 26.0–34.0)
MCHC: 33.5 g/dL (ref 30.0–36.0)
MCV: 88.5 fL (ref 80.0–100.0)
Platelets: 201 10*3/uL (ref 150–400)
RBC: 4.18 MIL/uL — ABNORMAL LOW (ref 4.22–5.81)
RDW: 14.1 % (ref 11.5–15.5)
WBC: 6.5 10*3/uL (ref 4.0–10.5)
nRBC: 0 % (ref 0.0–0.2)

## 2024-03-03 LAB — HEPARIN LEVEL (UNFRACTIONATED)
Heparin Unfractionated: 0.2 [IU]/mL — ABNORMAL LOW (ref 0.30–0.70)
Heparin Unfractionated: 0.43 [IU]/mL (ref 0.30–0.70)

## 2024-03-03 MED ORDER — HEPARIN (PORCINE) 25000 UT/250ML-% IV SOLN: 1400.0000 [IU]/h | INTRAVENOUS | Status: DC

## 2024-03-03 MED ORDER — EZETIMIBE 10 MG PO TABS: 10.0000 mg | ORAL_TABLET | Freq: Every day | ORAL | Status: DC

## 2024-03-03 NOTE — Consult Note (Signed)
 PHARMACY - ANTICOAGULATION CONSULT NOTE  Pharmacy Consult for heparin infusion Indication: chest pain/ACS  No Known Allergies  Vital Signs: Temp: 97.8 F (36.6 C) (04/11 2249) Temp Source: Oral (04/11 2249) BP: 151/61 (04/11 2249) Pulse Rate: 77 (04/11 2249)  Labs: Recent Labs    02/29/24 1428 02/29/24 1918 03/01/24 0116 03/01/24 1127 03/02/24 0858 03/02/24 0938 03/02/24 1528 03/03/24 0018  HGB 13.7  --  12.9*  --  12.1*  --   --   --   HCT 39.7  --  38.2*  --  35.8*  --   --   --   PLT 202  --  210  --  200  --   --   --   APTT 59*  --   --   --   --   --   --   --   LABPROT 12.9  --   --   --   --   --   --   --   INR 1.0  --   --   --   --   --   --   --   HEPARINUNFRC  --    < > >1.10*   < >  --  >1.10* <0.10* 0.20*  CREATININE  --   --  1.10  --   --   --   --   --    < > = values in this interval not displayed.    Estimated Creatinine Clearance: 67.5 mL/min (by C-G formula based on SCr of 1.1 mg/dL).   Medical History: Past Medical History:  Diagnosis Date   Coronary artery disease    s/p three coronary stents   Diabetes mellitus without complication (HCC)    Hypercholesterolemia    Hypertension    Hypothyroidism    Testicular cancer (HCC)     Medications:  No home anticoagulants per pharmacist review  Assessment:  78 yo male presented for elective LHC.  Patient found to have significant left main disease.    Heparin level 0.2 on infusion at 1150 units/hr. Lab was collected correctly. No issues with line or bleeding reported per RN.  Goal of Therapy:  Heparin level 0.3-0.7 units/ml Monitor platelets by anticoagulation protocol: Yes   Plan:  Increase heparin infusion to 1400 units/hr F/u 8 hr heparin level CBC daily  Young Hensen, PharmD. Clinical Pharmacist 03/03/2024 1:02 AM

## 2024-03-03 NOTE — Progress Notes (Addendum)
 Rounding Note    Patient Name: Jason Mclean. Date of Encounter: 03/03/2024  [facility] Cardiologist:  Dr. Cleda Curly [facility])  Subjective   Patient had mild chest pain again this morning, relieved with sublingual nitroglycerin.  Inpatient Medications    Scheduled Meds:  amLODipine  5 mg Oral Daily   aspirin EC  81 mg Oral Daily   atorvastatin  80 mg Oral q1800   cholecalciferol  2,000 Units Oral Daily   clopidogrel  75 mg Oral Daily   ezetimibe  10 mg Oral Daily   gabapentin  100 mg Oral QHS   insulin aspart  0-8 Units Subcutaneous TID WC   isosorbide mononitrate  60 mg Oral Daily   levothyroxine  75 mcg Oral QAC breakfast   losartan  50 mg Oral Daily   metoprolol succinate  75 mg Oral Daily   multivitamin with minerals  1 tablet Oral Daily   pantoprazole  40 mg Oral Daily   senna  2 tablet Oral Daily   Continuous Infusions:  heparin 1,400 Units/hr (03/03/24 0114)   PRN Meds: acetaminophen, nitroGLYCERIN, ondansetron (ZOFRAN) IV   Vital Signs    Vitals:   03/03/24 0338 03/03/24 0517 03/03/24 0613 03/03/24 0935  BP: 139/65 (!) 132/118 (!) 162/89 (!) 157/62  Pulse: 78 82 76 76  Resp: 17 16 16 18   Temp: 98.2 F (36.8 C)   97.7 F (36.5 C)  TempSrc: Oral   Oral  SpO2: 94% 97%  97%  Weight:      Height:        Intake/Output Summary (Last 24 hours) at 03/03/2024 1054 Last data filed at 03/03/2024 0338 Gross per 24 hour  Intake 480 ml  Output 350 ml  Net 130 ml      03/01/2024    1:00 AM 02/29/2024    8:38 AM 01/22/2024    5:06 PM  Last 3 Weights  Weight (lbs) 219 lb 5.7 oz 219 lb 5.7 oz 222 lb  Weight (kg) 99.5 kg 99.5 kg 100.699 kg      Telemetry    Sinus rhythm in the 70s- Personally Reviewed  ECG    03/02/2024 ECG (independently read by me): Normal sinus rhythm at 73 bpm.  PR interval 202 ms.  Poor R wave progression V1 through V3.  Physical Exam   BP (!) 157/62 (BP Location: Left Arm)   Pulse 76   Temp 97.7 F (36.5 C) (Oral)    Resp 18   Ht 5\' 9"  (1.753 m)   Wt 99.5 kg   SpO2 97%   BMI 32.39 kg/m  Gen: Appears comfortable, well-nourished CV: RRR, no dependent edema; no M/R/G Pulm: breathing easily      Labs    High Sensitivity Troponin:  No results for input(s): "TROPONINIHS" in the last 720 hours.   Chemistry Recent Labs  Lab 03/01/24 0116  NA 134*  K 3.5  CL 103  CO2 21*  GLUCOSE 187*  BUN 15  CREATININE 1.10  CALCIUM 9.0  MG 1.8  PROT 6.9  ALBUMIN 3.6  AST 23  ALT 18  ALKPHOS 49  BILITOT 0.6  GFRNONAA >60  ANIONGAP 10    Lipids  Recent Labs  Lab 03/01/24 0116  CHOL 192  TRIG 282*  HDL 46  LDLCALC 90  CHOLHDL 4.2    Hematology Recent Labs  Lab 03/01/24 0116 03/02/24 0858 03/03/24 0054  WBC 6.8 7.0 6.5  RBC 4.31 4.03* 4.18*  HGB 12.9*  12.1* 12.4*  HCT 38.2* 35.8* 37.0*  MCV 88.6 88.8 88.5  MCH 29.9 30.0 29.7  MCHC 33.8 33.8 33.5  RDW 14.1 14.0 14.1  PLT 210 200 201   Thyroid  Recent Labs  Lab 03/01/24 0116  TSH 2.918    BNP Recent Labs  Lab 03/01/24 0132  BNP 103.8*    DDimer No results for input(s): "DDIMER" in the last 168 hours.   Radiology    VAS US  DOPPLER PRE CABG Result Date: 03/01/2024 PREOPERATIVE VASCULAR EVALUATION Patient Name:  Jason Mclean.  Date of Exam:   03/01/2024 Medical Rec #: [id number]         Accession #:    [id number] Date of Birth: [birth year]         Patient Gender: M Patient Age:   30 years Exam Location:  [facility] Procedure:      VAS US  DOPPLER PRE CABG Referring Phys: Landon Pinion HENDRICKSON --------------------------------------------------------------------------------  Indications:      Pre-CABG. Risk Factors:     Coronary artery disease (Multiple stents). Hypertension.                   Diabetes. Hyperlipidemia. History of tobacco abuse, quit 2015 Comparison Study: No prior studies on file Performing Technologist: Carleene Chase RVS  Examination Guidelines: A complete evaluation includes B-mode imaging, spectral  Doppler, color Doppler, and power Doppler as needed of all accessible portions of each vessel. Bilateral testing is considered an integral part of a complete examination. Limited examinations for reoccurring indications may be performed as noted.  Right Carotid Findings: +----------+--------+--------+--------+------------+--------+           PSV cm/sEDV cm/sStenosisDescribe    Comments +----------+--------+--------+--------+------------+--------+ CCA Prox  116     22              heterogenous         +----------+--------+--------+--------+------------+--------+ CCA Distal128     24              heterogenous         +----------+--------+--------+--------+------------+--------+ ICA Prox  123     32      1-39%   heterogenous         +----------+--------+--------+--------+------------+--------+ ICA Mid   120     27              heterogenous         +----------+--------+--------+--------+------------+--------+ ICA Distal80      23              heterogenous         +----------+--------+--------+--------+------------+--------+ ECA       139     9               calcific             +----------+--------+--------+--------+------------+--------+ +----------+--------+-------+--------+------------+           PSV cm/sEDV cmsDescribeArm Pressure +----------+--------+-------+--------+------------+ [id number]                                 +----------+--------+-------+--------+------------+ +---------+--------+--+--------+--+ VertebralPSV cm/s34EDV cm/s13 +---------+--------+--+--------+--+ Left Carotid Findings: +----------+--------+--------+--------+------------+------------------+           PSV cm/sEDV cm/sStenosisDescribe    Comments           +----------+--------+--------+--------+------------+------------------+ CCA Prox  108     22  intimal thickening  +----------+--------+--------+--------+------------+------------------+ CCA Distal104     21                          intimal thickening +----------+--------+--------+--------+------------+------------------+ ICA Prox  85      23      1-39%   heterogenous                   +----------+--------+--------+--------+------------+------------------+ ICA Mid   105     28              heterogenous                   +----------+--------+--------+--------+------------+------------------+ ICA Distal79      27              heterogenous                   +----------+--------+--------+--------+------------+------------------+ ECA       178     23              calcific                       +----------+--------+--------+--------+------------+------------------+  +----------+--------+--------+--------+------------+ SubclavianPSV cm/sEDV cm/sDescribeArm Pressure +----------+--------+--------+--------+------------+           132                                  +----------+--------+--------+--------+------------+ +---------+--------+--+--------+--+ VertebralPSV cm/s79EDV cm/s17 +---------+--------+--+--------+--+  ABI Findings: +------------------+-----+---------+----------+ Rt Pressure (mmHg)IndexWaveform Comment    +------------------+-----+---------+----------+                        triphasicrestricted +------------------+-----+---------+----------+ 151               1.01 biphasic            +------------------+-----+---------+----------+ 151               1.01 biphasic            +------------------+-----+---------+----------+ 100               0.67 Normal              +------------------+-----+---------+----------+ +------------------+-----+---------+ Lt Pressure (mmHg)IndexWaveform  +------------------+-----+---------+ 149                    triphasic +------------------+-----+---------+ 137               0.92 biphasic   +------------------+-----+---------+ 64                0.43 biphasic  +------------------+-----+---------+ 43                0.29 Abnormal  +------------------+-----+---------+ +-------+---------------+ ABI/TBIToday's ABI/TBI +-------+---------------+ Right  1.01/0.67       +-------+---------------+ Left   0.92/0.29       +-------+---------------+  Right Doppler Findings: +--------+---------+----------+ Site    Doppler  Comments   +--------+---------+----------+ Brachialtriphasicrestricted +--------+---------+----------+ Radial  biphasic            +--------+---------+----------+ Ulnar   triphasic           +--------+---------+----------+  Left Doppler Findings: +--------+--------+---------+ Site    PressureDoppler   +--------+--------+---------+ [id number]     triphasic +--------+--------+---------+ Radial          triphasic +--------+--------+---------+ Ulnar           triphasic +--------+--------+---------+  Summary: Right Carotid: Velocities in the right ICA are consistent with a 1-39% stenosis. Left Carotid: Velocities in the left ICA are consistent with a 1-39% stenosis. Vertebrals:  Bilateral vertebral arteries demonstrate antegrade flow. Subclavians: Normal flow hemodynamics were seen in bilateral subclavian              arteries. Right ABI: Resting right ankle-brachial index is within normal range. The right toe-brachial index is normal. Left ABI: Resting left ankle-brachial index indicates mild left lower extremity arterial disease. The left toe-brachial index is abnormal. Right Upper Extremity: Doppler waveforms remain within normal limits with right radial compression. Doppler waveforms remain within normal limits with right ulnar compression. Left Upper Extremity: Doppler waveforms remain within normal limits with left radial compression. Doppler waveform obliterate with left ulnar compression.  Electronically signed by Carolynn Sayers on 03/01/2024 at  3:04:24 PM.    Final     Cardiac Studies     Prox RCA to Mid RCA lesion is 30% stenosed.   Mid RCA lesion is 75% stenosed.   Mid RCA to Dist RCA lesion is 60% stenosed.   3rd RPL lesion is 80% stenosed.   RPDA lesion is 90% stenosed.   Mid LAD lesion is 60% stenosed.   Mid LM to Dist LM lesion is 80% stenosed.   1st Mrg lesion is 75% stenosed.   The left ventricular systolic function is normal.   LV end diastolic pressure is normal.   The left ventricular ejection fraction is 50-55% by visual estimate.   1.  Left main and three-vessel coronary artery disease, with hazy appearing 80% stenosis distal left main, 60% stenosis mid LAD, 75% stenosis small caliber OM1, 75% in-stent restenosis mid RCA, 90% stenosis small caliber RPDA, 80% stenosis small caliber RPL 3 2.  Normal left ventricular function 3.  Unstable angina   Recommendations   1.  Transfer to [facility] for CABG 2.  Hold clopidogrel 3.  Start heparin drip       ECHO: 03/01/2024  1. Left ventricular ejection fraction, by estimation, is 65 to 70%. The  left ventricle has normal function. The left ventricle has no regional  wall motion abnormalities. There is severe asymmetric left ventricular  hypertrophy of the septal segment. Left   ventricular diastolic parameters are consistent with Grade I diastolic  dysfunction (impaired relaxation).   2. Right ventricular systolic function is normal. The right ventricular  size is normal. Tricuspid regurgitation signal is inadequate for assessing  PA pressure.   3. The mitral valve is normal in structure. No evidence of mitral valve  regurgitation. No evidence of mitral stenosis.   4. The aortic valve is normal in structure. Aortic valve regurgitation is  not visualized. No aortic stenosis is present.   5. The inferior vena cava is normal in size with greater than 50%  respiratory variability, suggesting right atrial pressure of 3 mmHg.    Surgical evaluation: 03/01/2024 Chart  reviewed, patient examined, agree with above.  This 78 year old gentleman has a hazy 80% left main trifurcation stenosis and severe three-vessel coronary disease.  He has in-stent restenosis in the RCA as well as diffuse high-grade stenoses throughout the posterior descending and posterolateral branches which are small.  I do not think it is possible to bypass beyond the stenoses.  The LAD has 60% mid vessel stenosis before a diagonal branch and then probably at least 60% stenosis at the junction of the middle and distal thirds.  Beyond this the vessel is small caliber  and not graftable.  The midportion of the vessel is adequate caliber but is probably intramyocardial.  The left circumflex branches are too small to graft.  The only vessel that I would consider potentially graftable anywhere would be the LAD in the midportion and that would still leave the left circumflex and RCA territories ischemic.  I would not recommend bypass surgery in this circumstance because it would likely have a poor outcome.  I will discuss with interventional cardiology whether high risk PCI of the left main is an option.  I reviewed the catheterization findings with the patient and his family and answered all their questions.  They are obviously disappointed that I do not think surgery is a good option.   Bartley Lightning, MD  Patient Profile     78 y.o. male with established CAD dating back to 1999.  He states he is had a total of 6 stents placed in his RCA.  He had recently developed increased anginal symptomatology.  Assessment & Plan     Per Dr. Arlana Bellini most recent note: Multivessel CAD: Patient tells me he had his initial RCA stent placed approximately 26 years ago.  He has had multiple stents since and has 6 stents in total in his RCA.  He had developed recurrent chest pain symptomatology 1 month ago and apparently had an abnormal nuclear study resulting in his catheterization at [facility] by Dr. Cleda Curly on  February 29, 2024.  Patient was transferred to [facility] for surgical evaluation and CABG.  He was seen by Dr. Sherene Dilling yesterday as noted above who felt the surgical op procedure was suboptimal particularly with the extensive disease in the RCA with small distal vessels and that the LAD which potentially is graftable would potentially fit into the apical LAD portion which is very small caliber or possible the mid LAD which was intramyocardial.  I looked at the films today with interventional colleague.  He has high-grade 80 to 85% left main stenosis which trifurcates into the LAD,  ramus and circumflex system.  As result, intervention would be extremely high risk.  I tried to call Dr. Cleda Curly earlier.  Discussed with the patient.  Options include possibly sending patient to [facility] for further evaluation for CABG revascularization or high risk complex intervention.  Patient did have chest pain this morning on IV heparin.  Will increase nitrate therapy; continue amlodipine currently at 5 mg but depending on blood pressure and symptoms may need further titration.  Resting pulse in the mid 70s, increase metoprolol succinate to 75 mg.  At present continue DAPT.  With chest pain this morning will need to stay in the hospital.  Consider further discussion of possible limited CABG to improve LAD territory if at all possible.  Echo Doppler study shows normal LV function with EF 65 to 70% without regional wall motion abnormalities. Hyperlipidemia: Total cholesterol 192, triglycerides 282, VLDL 56 and LDL 90.  Mixed hyperlipidemia panel.  Will need to maximize lipid-lowering therapy and achieve target LDL less than 55 if at all possible.  LP(a) is normal at 26.4.  Patient is on atorvastatin 80 mg and Zetia 10 mg.  Will need to initiate PCSK9 inhibition.   Dr. Loetta Ringer spoke with Dr. Cleda Curly, and transfer to [facility] is currently pending.   For questions or updates, please contact [facility] Please consult [email or web address] for  contact info under    Signed, Efraim Grange, MD  03/03/2024, 10:54 AM

## 2024-03-03 NOTE — Progress Notes (Signed)
 Patient c/o cp 8 out of 10, mid sternal. One nitro given. Pain went 4.

## 2024-03-03 NOTE — Plan of Care (Signed)
  Problem: Education: Goal: Knowledge of General Education information will improve Description: Including pain rating scale, medication(s)/side effects and non-pharmacologic comfort measures Outcome: Progressing   Problem: Health Behavior/Discharge Planning: Goal: Ability to manage health-related needs will improve Outcome: Progressing   Problem: Clinical Measurements: Goal: Ability to maintain clinical measurements within normal limits will improve Outcome: Progressing Goal: Will remain free from infection Outcome: Progressing Goal: Diagnostic test results will improve Outcome: Progressing Goal: Respiratory complications will improve Outcome: Progressing   Problem: Activity: Goal: Risk for activity intolerance will decrease Outcome: Progressing   Problem: Nutrition: Goal: Adequate nutrition will be maintained Outcome: Progressing   Problem: Coping: Goal: Level of anxiety will decrease Outcome: Progressing   Problem: Elimination: Goal: Will not experience complications related to bowel motility Outcome: Progressing Goal: Will not experience complications related to urinary retention Outcome: Progressing   Problem: Pain Managment: Goal: General experience of comfort will improve and/or be controlled Outcome: Progressing   Problem: Safety: Goal: Ability to remain free from injury will improve Outcome: Progressing   Problem: Skin Integrity: Goal: Risk for impaired skin integrity will decrease Outcome: Progressing   Problem: Education: Goal: Understanding of cardiac disease, CV risk reduction, and recovery process will improve Outcome: Progressing Goal: Individualized Educational Video(s) Outcome: Progressing   Problem: Activity: Goal: Ability to tolerate increased activity will improve Outcome: Progressing   Problem: Cardiac: Goal: Ability to achieve and maintain adequate cardiovascular perfusion will improve Outcome: Progressing   Problem: Health  Behavior/Discharge Planning: Goal: Ability to safely manage health-related needs after discharge will improve Outcome: Progressing   Problem: Education: Goal: Ability to describe self-care measures that may prevent or decrease complications (Diabetes Survival Skills Education) will improve Outcome: Progressing Goal: Individualized Educational Video(s) Outcome: Progressing   Problem: Coping: Goal: Ability to adjust to condition or change in health will improve Outcome: Progressing   Problem: Fluid Volume: Goal: Ability to maintain a balanced intake and output will improve Outcome: Progressing   Problem: Health Behavior/Discharge Planning: Goal: Ability to identify and utilize available resources and services will improve Outcome: Progressing Goal: Ability to manage health-related needs will improve Outcome: Progressing   Problem: Metabolic: Goal: Ability to maintain appropriate glucose levels will improve Outcome: Progressing   Problem: Nutritional: Goal: Maintenance of adequate nutrition will improve Outcome: Progressing Goal: Progress toward achieving an optimal weight will improve Outcome: Progressing   Problem: Skin Integrity: Goal: Risk for impaired skin integrity will decrease Outcome: Progressing   Problem: Tissue Perfusion: Goal: Adequacy of tissue perfusion will improve Outcome: Progressing

## 2024-03-03 NOTE — Discharge Summary (Signed)
 Discharge Summary    Patient ID: Jason Mclean. MRN: [id number]; DOB: [birth year]  Admit date: 02/29/2024 Discharge date: 03/03/2024  PCP:  Antonio Baumgarten, MD   [facility] Providers Cardiologist:  Percival Brace, MD     Discharge Diagnoses    Principal Problem:   CAD (coronary artery disease) Active Problems:   Uncontrolled type 2 diabetes mellitus with hyperglycemia, without long-term current use of insulin (HCC)   Hypertension   Hyperlipidemia   Hypothyroidism  Diagnostic Studies/Procedures    Cath: 02/29/2024    Prox RCA to Mid RCA lesion is 30% stenosed.   Mid RCA lesion is 75% stenosed.   Mid RCA to Dist RCA lesion is 60% stenosed.   3rd RPL lesion is 80% stenosed.   RPDA lesion is 90% stenosed.   Mid LAD lesion is 60% stenosed.   Mid LM to Dist LM lesion is 80% stenosed.   1st Mrg lesion is 75% stenosed.   The left ventricular systolic function is normal.   LV end diastolic pressure is normal.   The left ventricular ejection fraction is 50-55% by visual estimate.   1.  Left main and three-vessel coronary artery disease, with hazy appearing 80% stenosis distal left main, 60% stenosis mid LAD, 75% stenosis small caliber OM1, 75% in-stent restenosis mid RCA, 90% stenosis small caliber RPDA, 80% stenosis small caliber RPL 3 2.  Normal left ventricular function 3.  Unstable angina   Recommendations   1.  Transfer to [facility] for CABG 2.  Hold clopidogrel 3.  Start heparin drip _____________   History of Present Illness     Jason Mclean. is a 78 y.o. male with past medical history of CAD status post multiple PCI's, diabetes, hypertension, hyperlipidemia, hypothyroidism and COPD who initially presented to [facility] for outpatient cardiac catheterization after undergoing a SPECT stress test which was positive primarily by symptoms.  He underwent cardiac catheterization 4/9 showing extensive coronary artery disease most notably of 80% stenosis  in the mid to distal left main.  He was ultimately transferred to [facility] for further evaluation for consideration of CABG.  Hospital Course     Consultants: CT surgery  Unstable angina Coronary artery disease s/p multiple prior stents -- underwent cardiac cath 4/9 with severe LM stenosis along with 60% stenosis mid LAD, 75% stenosis small caliber OM1, 75% in-stent restenosis mid RCA, 90% stenosis small caliber RPDA, 80% stenosis small caliber RPL 3  -- he was transferred to [facility] for further evaluation and consideration of CABG. he was evaluated by Dr. Sherene Dilling who felt that his surgical options were suboptimal particularly with the extensive disease in his RCA with small distal vessels in the LAD which was potentially graftable though to a small caliber apical LAD portion, or possibly in the mid LAD which was intramyocardial.  Films were again reviewed with interventional MDs and felt that his high-grade 80 to 85% left main stenosis which trifurcates into LAD, ramus and circumflex system would be extremely high risk.  His case was discussed with Dr. Parks Bollman and Dr. Pearletha Bouche at [facility].  He was accepted for transfer for consideration of CABG versus high risk PCI. -- Given he was turned down for CABG at [facility] he had been resumed on Plavix, last dose 03/03/2024 am -- echo 4/10 with LVEF of 60-65%, g1DD, no rWMA, or significant valvular disease  -- Continue aspirin, atorvastatin 80 mg daily, Zetia 10 mg daily, Imdur 60 mg daily, metoprolol XL 75 mg  daily, losartan 50 mg daily  Hyperlipidemia -- LDL 90, HDL 46, Trig 282 -- Continue atorvastatin 80 mg daily, Zetia 10 mg daily   Hypertension -- Continue amlodipine 5 mg daily, losartan 50 mg daily, metoprolol XL 75 mg daily  Diabetes -- Hemoglobin A1c 8.3 -- Treated with SSI while inpatient  Hypothyroidism -- Continue Synthroid  Patient was evaluated by Dr. Nelly Laurence and deemed stable for transfer to [facility].  EMTALA and medical necessity form  completed prior to transfer.  _____________  Discharge Vitals Blood pressure (!) 157/85, pulse 83, temperature 97.8 F (36.6 C), temperature source Oral, resp. rate 18, height 5\' 9"  (1.753 m), weight 99.5 kg, SpO2 96%.  Filed Weights   03/01/24 0100  Weight: 99.5 kg    Labs & Radiologic Studies    CBC Recent Labs    03/01/24 0116 03/02/24 0858 03/03/24 0054  WBC 6.8 7.0 6.5  NEUTROABS 3.8  --   --   HGB 12.9* 12.1* 12.4*  HCT 38.2* 35.8* 37.0*  MCV 88.6 88.8 88.5  PLT 210 200 201   Basic Metabolic Panel Recent Labs    16/10/96 0116  NA 134*  K 3.5  CL 103  CO2 21*  GLUCOSE 187*  BUN 15  CREATININE 1.10  CALCIUM 9.0  MG 1.8   Liver Function Tests Recent Labs    03/01/24 0116  AST 23  ALT 18  ALKPHOS 49  BILITOT 0.6  PROT 6.9  ALBUMIN 3.6   No results for input(s): "LIPASE", "AMYLASE" in the last 72 hours. High Sensitivity Troponin:   No results for input(s): "TROPONINIHS" in the last 720 hours.  BNP Invalid input(s): "POCBNP" D-Dimer No results for input(s): "DDIMER" in the last 72 hours. Hemoglobin A1C No results for input(s): "HGBA1C" in the last 72 hours. Fasting Lipid Panel Recent Labs    03/01/24 0116  CHOL 192  HDL 46  LDLCALC 90  TRIG 282*  CHOLHDL 4.2   Thyroid Function Tests Recent Labs    03/01/24 0116  TSH 2.918   _____________  VAS US DOPPLER PRE CABG Result Date: 03/01/2024 PREOPERATIVE VASCULAR EVALUATION Patient Name:  Jason Mclean.  Date of Exam:   03/01/2024 Medical Rec #: [id number]         Accession #:    [id number] Date of Birth: 1946/07/04         Patient Gender: M Patient Age:   78 years Exam Location:  [facility] Procedure:      VAS US DOPPLER PRE CABG Referring Phys: Viviann Spare HENDRICKSON --------------------------------------------------------------------------------  Indications:      Pre-CABG. Risk Factors:     Coronary artery disease (Multiple stents). Hypertension.                   Diabetes.  Hyperlipidemia. History of tobacco abuse, quit 2015 Comparison Study: No prior studies on file Performing Technologist: Sherren Kerns RVS  Examination Guidelines: A complete evaluation includes B-mode imaging, spectral Doppler, color Doppler, and power Doppler as needed of all accessible portions of each vessel. Bilateral testing is considered an integral part of a complete examination. Limited examinations for reoccurring indications may be performed as noted.  Right Carotid Findings: +----------+--------+--------+--------+------------+--------+           PSV cm/sEDV cm/sStenosisDescribe    Comments +----------+--------+--------+--------+------------+--------+ CCA Prox  116     22              heterogenous         +----------+--------+--------+--------+------------+--------+ CCA [id number]  24              heterogenous         +----------+--------+--------+--------+------------+--------+ ICA Prox  123     32      1-39%   heterogenous         +----------+--------+--------+--------+------------+--------+ ICA Mid   120     27              heterogenous         +----------+--------+--------+--------+------------+--------+ ICA Distal80      23              heterogenous         +----------+--------+--------+--------+------------+--------+ ECA       139     9               calcific             +----------+--------+--------+--------+------------+--------+ +----------+--------+-------+--------+------------+           PSV cm/sEDV cmsDescribeArm Pressure +----------+--------+-------+--------+------------+ [id number]                                 +----------+--------+-------+--------+------------+ +---------+--------+--+--------+--+ VertebralPSV cm/s34EDV cm/s13 +---------+--------+--+--------+--+ Left Carotid Findings: +----------+--------+--------+--------+------------+------------------+           PSV cm/sEDV cm/sStenosisDescribe    Comments            +----------+--------+--------+--------+------------+------------------+ CCA Prox  108     22                          intimal thickening +----------+--------+--------+--------+------------+------------------+ CCA Distal104     21                          intimal thickening +----------+--------+--------+--------+------------+------------------+ ICA Prox  85      23      1-39%   heterogenous                   +----------+--------+--------+--------+------------+------------------+ ICA Mid   105     28              heterogenous                   +----------+--------+--------+--------+------------+------------------+ ICA Distal79      27              heterogenous                   +----------+--------+--------+--------+------------+------------------+ ECA       178     23              calcific                       +----------+--------+--------+--------+------------+------------------+  +----------+--------+--------+--------+------------+ SubclavianPSV cm/sEDV cm/sDescribeArm Pressure +----------+--------+--------+--------+------------+           132                                  +----------+--------+--------+--------+------------+ +---------+--------+--+--------+--+ VertebralPSV cm/s79EDV cm/s17 +---------+--------+--+--------+--+  ABI Findings: +------------------+-----+---------+----------+ Rt Pressure (mmHg)IndexWaveform Comment    +------------------+-----+---------+----------+                        triphasicrestricted +------------------+-----+---------+----------+ 151               1.01 biphasic            +------------------+-----+---------+----------+  151               1.01 biphasic            +------------------+-----+---------+----------+ 100               0.67 Normal              +------------------+-----+---------+----------+ +------------------+-----+---------+ Lt Pressure (mmHg)IndexWaveform   +------------------+-----+---------+ 149                    triphasic +------------------+-----+---------+ 137               0.92 biphasic  +------------------+-----+---------+ 64                0.43 biphasic  +------------------+-----+---------+ 43                0.29 Abnormal  +------------------+-----+---------+ +-------+---------------+ ABI/TBIToday's ABI/TBI +-------+---------------+ Right  1.01/0.67       +-------+---------------+ Left   0.92/0.29       +-------+---------------+  Right Doppler Findings: +--------+---------+----------+ Site    Doppler  Comments   +--------+---------+----------+ Brachialtriphasicrestricted +--------+---------+----------+ Radial  biphasic            +--------+---------+----------+ Ulnar   triphasic           +--------+---------+----------+  Left Doppler Findings: +--------+--------+---------+ Site    PressureDoppler   +--------+--------+---------+ [id number]     triphasic +--------+--------+---------+ Radial          triphasic +--------+--------+---------+ Ulnar           triphasic +--------+--------+---------+   Summary: Right Carotid: Velocities in the right ICA are consistent with a 1-39% stenosis. Left Carotid: Velocities in the left ICA are consistent with a 1-39% stenosis. Vertebrals:  Bilateral vertebral arteries demonstrate antegrade flow. Subclavians: Normal flow hemodynamics were seen in bilateral subclavian              arteries. Right ABI: Resting right ankle-brachial index is within normal range. The right toe-brachial index is normal. Left ABI: Resting left ankle-brachial index indicates mild left lower extremity arterial disease. The left toe-brachial index is abnormal. Right Upper Extremity: Doppler waveforms remain within normal limits with right radial compression. Doppler waveforms remain within normal limits with right ulnar compression. Left Upper Extremity: Doppler waveforms remain within normal  limits with left radial compression. Doppler waveform obliterate with left ulnar compression.  Electronically signed by Delaney Fearing on 03/01/2024 at 3:04:24 PM.    Final    ECHOCARDIOGRAM COMPLETE Result Date: 03/01/2024    ECHOCARDIOGRAM REPORT   Patient Name:   Gabriella Guile. Date of Exam: 03/01/2024 Medical Rec #:  [id number]        Height:       69.0 in Accession #:    [id number]       Weight:       219.4 lb Date of Birth:  [birth year]        BSA:          2.149 m Patient Age:    77 years         BP:           139/65 mmHg Patient Gender: M                HR:           76 bpm. Exam Location:  Inpatient Procedure: 2D Echo, Color Doppler and Cardiac Doppler (Both Spectral and Color            Flow Doppler  were utilized during procedure). Indications:    R07.9* Chest pain, unspecified  History:        Patient has no prior history of Echocardiogram examinations. CAD                 and Previous Myocardial Infarction, COPD; Risk Factors:Diabetes                 and Hypertension.  Sonographer:    Andrena Bang Referring Phys: [id number] Christena Covert IMPRESSIONS  1. Left ventricular ejection fraction, by estimation, is 65 to 70%. The left ventricle has normal function. The left ventricle has no regional wall motion abnormalities. There is severe asymmetric left ventricular hypertrophy of the septal segment. Left  ventricular diastolic parameters are consistent with Grade I diastolic dysfunction (impaired relaxation).  2. Right ventricular systolic function is normal. The right ventricular size is normal. Tricuspid regurgitation signal is inadequate for assessing PA pressure.  3. The mitral valve is normal in structure. No evidence of mitral valve regurgitation. No evidence of mitral stenosis.  4. The aortic valve is normal in structure. Aortic valve regurgitation is not visualized. No aortic stenosis is present.  5. The inferior vena cava is normal in size with greater than 50% respiratory variability, suggesting right  atrial pressure of 3 mmHg. FINDINGS  Left Ventricle: Left ventricular ejection fraction, by estimation, is 65 to 70%. The left ventricle has normal function. The left ventricle has no regional wall motion abnormalities. The left ventricular internal cavity size was normal in size. There is  severe asymmetric left ventricular hypertrophy of the septal segment. Left ventricular diastolic parameters are consistent with Grade I diastolic dysfunction (impaired relaxation). Right Ventricle: The right ventricular size is normal. No increase in right ventricular wall thickness. Right ventricular systolic function is normal. Tricuspid regurgitation signal is inadequate for assessing PA pressure. Left Atrium: Left atrial size was normal in size. Right Atrium: Right atrial size was normal in size. Pericardium: There is no evidence of pericardial effusion. Mitral Valve: The mitral valve is normal in structure. No evidence of mitral valve regurgitation. No evidence of mitral valve stenosis. Tricuspid Valve: The tricuspid valve is normal in structure. Tricuspid valve regurgitation is not demonstrated. No evidence of tricuspid stenosis. Aortic Valve: The aortic valve is normal in structure. Aortic valve regurgitation is not visualized. No aortic stenosis is present. Aortic valve mean gradient measures 5.0 mmHg. Aortic valve peak gradient measures 10.1 mmHg. Aortic valve area, by VTI measures 1.57 cm. Pulmonic Valve: The pulmonic valve was normal in structure. Pulmonic valve regurgitation is not visualized. No evidence of pulmonic stenosis. Aorta: The aortic root is normal in size and structure. Venous: The inferior vena cava is normal in size with greater than 50% respiratory variability, suggesting right atrial pressure of 3 mmHg. IAS/Shunts: No atrial level shunt detected by color flow Doppler.  LEFT VENTRICLE PLAX 2D LVIDd:         4.30 cm     Diastology LVIDs:         4.20 cm     LV e' medial:    5.91 cm/s LV PW:         1.20  cm     LV E/e' medial:  10.7 LV IVS:        1.70 cm     LV e' lateral:   7.30 cm/s LVOT diam:     2.00 cm     LV E/e' lateral: 8.6 LV SV:  51 LV SV Index:   24 LVOT Area:     3.14 cm  LV Volumes (MOD) LV vol d, MOD A2C: 93.8 ml LV vol d, MOD A4C: 91.6 ml LV vol s, MOD A2C: 22.1 ml LV vol s, MOD A4C: 29.8 ml LV SV MOD A2C:     71.7 ml LV SV MOD A4C:     91.6 ml LV SV MOD BP:      66.6 ml RIGHT VENTRICLE RV S prime:     15.20 cm/s TAPSE (M-mode): 2.2 cm LEFT ATRIUM             Index LA diam:        3.60 cm 1.68 cm/m LA Vol (A2C):   47.6 ml 22.15 ml/m LA Vol (A4C):   26.9 ml 12.52 ml/m LA Biplane Vol: 38.5 ml 17.92 ml/m  AORTIC VALVE AV Area (Vmax):    1.51 cm AV Area (Vmean):   1.37 cm AV Area (VTI):     1.57 cm AV Vmax:           159.00 cm/s AV Vmean:          104.000 cm/s AV VTI:            0.324 m AV Peak Grad:      10.1 mmHg AV Mean Grad:      5.0 mmHg LVOT Vmax:         76.50 cm/s LVOT Vmean:        45.300 cm/s LVOT VTI:          0.162 m LVOT/AV VTI ratio: 0.50  AORTA Ao Asc diam: 3.10 cm MITRAL VALVE MV Area (PHT): 3.77 cm    SHUNTS MV Decel Time: 201 msec    Systemic VTI:  0.16 m MV E velocity: 63.00 cm/s  Systemic Diam: 2.00 cm MV A velocity: 85.10 cm/s MV E/A ratio:  0.74 Kardie Tobb DO Electronically signed by Jerryl Morin DO Signature Date/Time: 03/01/2024/11:32:06 AM    Final    CARDIAC CATHETERIZATION Result Date: 02/29/2024   Prox RCA to Mid RCA lesion is 30% stenosed.   Mid RCA lesion is 75% stenosed.   Mid RCA to Dist RCA lesion is 60% stenosed.   3rd RPL lesion is 80% stenosed.   RPDA lesion is 90% stenosed.   Mid LAD lesion is 60% stenosed.   Mid LM to Dist LM lesion is 80% stenosed.   1st Mrg lesion is 75% stenosed.   The left ventricular systolic function is normal.   LV end diastolic pressure is normal.   The left ventricular ejection fraction is 50-55% by visual estimate. 1.  Left main and three-vessel coronary artery disease, with hazy appearing 80% stenosis distal left main, 60%  stenosis mid LAD, 75% stenosis small caliber OM1, 75% in-stent restenosis mid RCA, 90% stenosis small caliber RPDA, 80% stenosis small caliber RPL 3 2.  Normal left ventricular function 3.  Unstable angina Recommendations 1.  Transfer to [facility] for CABG 2.  Hold clopidogrel 3.  Start heparin drip   Disposition   Pt is being transferred to [facility] for further evaluation  Follow-up Plans & Appointments     Discharge Instructions     Diet - low sodium heart healthy   Complete by: As directed    Increase activity slowly   Complete by: As directed         Discharge Medications   Allergies as of 03/03/2024   No Known Allergies  Medication List     TAKE these medications    amLODipine 5 MG tablet Commonly known as: NORVASC Take 5 mg by mouth daily.   aspirin EC 81 MG tablet Take 81 mg by mouth daily.   atorvastatin 80 MG tablet Commonly known as: LIPITOR Take 1 tablet (80 mg total) by mouth daily at 6 PM.   clopidogrel 75 MG tablet Commonly known as: PLAVIX Take 1 tablet (75 mg total) by mouth daily with breakfast. What changed: when to take this   Coenzyme Q10 100 MG Tabs Take 100 mg by mouth every Jason day.   ezetimibe 10 MG tablet Commonly known as: ZETIA Take 1 tablet (10 mg total) by mouth daily. Start taking on: March 04, 2024   glimepiride 4 MG tablet Commonly known as: AMARYL Take 4 mg by mouth daily with breakfast.   heparin 25000 UT/250ML infusion Inject 1,400 Units/hr into the vein continuous.   isosorbide mononitrate 60 MG 24 hr tablet Commonly known as: IMDUR Take 60 mg by mouth daily. What changed: Another medication with the same name was removed. Continue taking this medication, and follow the directions you see here.   levothyroxine 75 MCG tablet Commonly known as: SYNTHROID Take 75 mcg by mouth daily before breakfast.   losartan 100 MG tablet Commonly known as: COZAAR Take 50 mg by mouth daily.   metFORMIN 1000 MG tablet Commonly  known as: GLUCOPHAGE Take 1,000 mg by mouth at bedtime.   metoprolol succinate 50 MG 24 hr tablet Commonly known as: TOPROL-XL Take 1 tablet (50 mg total) by mouth daily. Take with or immediately following a meal. What changed:  how much to take additional instructions   multivitamin with minerals Tabs tablet Take 1 tablet by mouth daily. Centrum   nitroGLYCERIN 0.4 MG SL tablet Commonly known as: NITROSTAT Place 1 tablet (0.4 mg total) under the tongue every 5 (five) minutes x 3 doses as needed for chest pain.   pantoprazole 40 MG tablet Commonly known as: PROTONIX Take 40 mg by mouth daily.   Rybelsus 14 MG Tabs Generic drug: Semaglutide Take 1 tablet by mouth daily.   TURMERIC PO Take 1 capsule by mouth daily.   VITAMIN D PO Take 1 capsule by mouth at bedtime.           Outstanding Labs/Studies   N/a   Duration of Discharge Encounter: APP Time: 25 minutes   Signed, Johnie Nailer, NP 03/03/2024, 1:42 PM

## 2024-03-03 NOTE — Progress Notes (Signed)
 Patient had another episode of cp 7 (0-10). One nitro given. Pain is now (0)

## 2024-03-03 NOTE — Consult Note (Signed)
 PHARMACY - ANTICOAGULATION CONSULT NOTE  Pharmacy Consult for heparin infusion Indication: chest pain/ACS  No Known Allergies  Vital Signs: Temp: 97.7 F (36.5 C) (04/12 0935) Temp Source: Oral (04/12 0935) BP: 157/62 (04/12 0935) Pulse Rate: 76 (04/12 0935)  Labs: Recent Labs    02/29/24 1428 02/29/24 1918 03/01/24 0116 03/01/24 1127 03/02/24 0858 03/02/24 0938 03/02/24 1528 03/03/24 0018 03/03/24 0054  HGB 13.7  --  12.9*  --  12.1*  --   --   --  12.4*  HCT 39.7  --  38.2*  --  35.8*  --   --   --  37.0*  PLT 202  --  210  --  200  --   --   --  201  APTT 59*  --   --   --   --   --   --   --   --   LABPROT 12.9  --   --   --   --   --   --   --   --   INR 1.0  --   --   --   --   --   --   --   --   HEPARINUNFRC  --    < > >1.10*   < >  --  >1.10* <0.10* 0.20*  --   CREATININE  --   --  1.10  --   --   --   --   --   --    < > = values in this interval not displayed.    Estimated Creatinine Clearance: 67.5 mL/min (by C-G formula based on SCr of 1.1 mg/dL).   Medical History: Past Medical History:  Diagnosis Date   Coronary artery disease    s/p three coronary stents   Diabetes mellitus without complication (HCC)    Hypercholesterolemia    Hypertension    Hypothyroidism    Testicular cancer (HCC)     Medications:  No home anticoagulants per pharmacist review  Assessment:  78 yo male presented for elective LHC.  Patient found to have significant left main disease.    Heparin level 0.43 on infusion at 1400 units/hr.  No issues with line or bleeding reported per RN. Hgb 12.4 and platelets 201 today.   Goal of Therapy:  Heparin level 0.3-0.7 units/ml Monitor platelets by anticoagulation protocol: Yes   Plan:  Continue heparin at 1400 units/hr F/u confirmatory 8 hr heparin level F/u daily heparin level  CBC daily  Leonora Ramus, PharmD, BCPS PGY2 Pharmacy Resident

## 2024-03-09 DIAGNOSIS — I2510 Atherosclerotic heart disease of native coronary artery without angina pectoris: Secondary | ICD-10-CM | POA: Diagnosis not present

## 2024-03-09 DIAGNOSIS — Z951 Presence of aortocoronary bypass graft: Secondary | ICD-10-CM | POA: Diagnosis not present

## 2024-03-09 DIAGNOSIS — I214 Non-ST elevation (NSTEMI) myocardial infarction: Secondary | ICD-10-CM | POA: Diagnosis not present

## 2024-03-19 DIAGNOSIS — C6211 Malignant neoplasm of descended right testis: Secondary | ICD-10-CM | POA: Diagnosis not present

## 2024-03-19 DIAGNOSIS — Z6832 Body mass index (BMI) 32.0-32.9, adult: Secondary | ICD-10-CM | POA: Diagnosis not present

## 2024-03-19 DIAGNOSIS — I10 Essential (primary) hypertension: Secondary | ICD-10-CM | POA: Diagnosis not present

## 2024-03-19 DIAGNOSIS — Z125 Encounter for screening for malignant neoplasm of prostate: Secondary | ICD-10-CM | POA: Diagnosis not present

## 2024-03-19 DIAGNOSIS — E039 Hypothyroidism, unspecified: Secondary | ICD-10-CM | POA: Diagnosis not present

## 2024-03-19 DIAGNOSIS — E1165 Type 2 diabetes mellitus with hyperglycemia: Secondary | ICD-10-CM | POA: Diagnosis not present

## 2024-03-19 DIAGNOSIS — K219 Gastro-esophageal reflux disease without esophagitis: Secondary | ICD-10-CM | POA: Diagnosis not present

## 2024-03-19 DIAGNOSIS — Z955 Presence of coronary angioplasty implant and graft: Secondary | ICD-10-CM | POA: Diagnosis not present

## 2024-03-19 DIAGNOSIS — J449 Chronic obstructive pulmonary disease, unspecified: Secondary | ICD-10-CM | POA: Diagnosis not present

## 2024-03-26 DIAGNOSIS — Z8547 Personal history of malignant neoplasm of testis: Secondary | ICD-10-CM | POA: Diagnosis not present

## 2024-03-26 DIAGNOSIS — E039 Hypothyroidism, unspecified: Secondary | ICD-10-CM | POA: Diagnosis not present

## 2024-03-26 DIAGNOSIS — D649 Anemia, unspecified: Secondary | ICD-10-CM | POA: Diagnosis not present

## 2024-03-26 DIAGNOSIS — I2510 Atherosclerotic heart disease of native coronary artery without angina pectoris: Secondary | ICD-10-CM | POA: Diagnosis not present

## 2024-03-26 DIAGNOSIS — Z87891 Personal history of nicotine dependence: Secondary | ICD-10-CM | POA: Diagnosis not present

## 2024-03-26 DIAGNOSIS — Z951 Presence of aortocoronary bypass graft: Secondary | ICD-10-CM | POA: Diagnosis not present

## 2024-03-26 DIAGNOSIS — E119 Type 2 diabetes mellitus without complications: Secondary | ICD-10-CM | POA: Diagnosis not present

## 2024-03-26 DIAGNOSIS — J449 Chronic obstructive pulmonary disease, unspecified: Secondary | ICD-10-CM | POA: Diagnosis not present

## 2024-03-28 DIAGNOSIS — Z951 Presence of aortocoronary bypass graft: Secondary | ICD-10-CM | POA: Diagnosis not present

## 2024-03-28 DIAGNOSIS — I252 Old myocardial infarction: Secondary | ICD-10-CM | POA: Diagnosis not present

## 2024-03-28 DIAGNOSIS — Z48812 Encounter for surgical aftercare following surgery on the circulatory system: Secondary | ICD-10-CM | POA: Diagnosis not present

## 2024-03-28 DIAGNOSIS — R9431 Abnormal electrocardiogram [ECG] [EKG]: Secondary | ICD-10-CM | POA: Diagnosis not present

## 2024-03-28 DIAGNOSIS — I440 Atrioventricular block, first degree: Secondary | ICD-10-CM | POA: Diagnosis not present

## 2024-03-28 DIAGNOSIS — I214 Non-ST elevation (NSTEMI) myocardial infarction: Secondary | ICD-10-CM | POA: Diagnosis not present

## 2024-03-29 NOTE — Discharge Summary (Signed)
 Physician Discharge Summary  Patient ID: Jason Mclean. MRN: [id number] DOB/AGE: [birth year] 78 y.o.  Admit date: 02/29/2024 Discharge date: 03/29/2024  Primary Discharge Diagnosis unstable angina Secondary Discharge Diagnosis coronary artery disease  Significant Diagnostic Studies: angiography:   Consults: None  Hospital Course: The patient underwent elective cardiac catheterization/07/2024 which revealed left main and three-vessel coronary artery disease with hazy appearing 80% stenosis of distal left main, 60% stenosis of mid LAD, 75% stenosis small caliber OM1, 75% in-stent restenosis mid RCA, 80% stenosis small caliber RPL 3 and 90% stenosis small caliber artery PDA.  The patient was urgently transferred to [facility] for coronary artery bypass graft surgery.   Discharge Exam: Blood pressure 125/89, pulse 91, temperature (!) 97.5 F (36.4 C), temperature source Oral, resp. rate 20, height 5\' 9"  (1.753 m), weight 99.5 kg, SpO2 99%.  General appearance: alert Head: Normocephalic, without obvious abnormality, atraumatic Eyes: conjunctivae/corneas clear. PERRL, EOM's intact. Fundi benign. Ears: normal TM's and external ear canals both ears Nose: Nares normal. Septum midline. Mucosa normal. No drainage or sinus tenderness. Throat: lips, mucosa, and tongue normal; teeth and gums normal Neck: no adenopathy, no carotid bruit, no JVD, supple, symmetrical, trachea midline, and thyroid  not enlarged, symmetric, no tenderness/mass/nodules Back: symmetric, no curvature. ROM normal. No CVA tenderness. Resp: clear to auscultation bilaterally Chest wall: no tenderness Cardio: regular rate and rhythm, S1, S2 normal, no murmur, click, rub or gallop GI: soft, non-tender; bowel sounds normal; no masses,  no organomegaly Extremities: extremities normal, atraumatic, no cyanosis or edema Pulses: 2+ and symmetric Skin: Skin color, texture, turgor normal. No rashes or lesions Neurologic:  Grossly normal Labs:   Lab Results  Component Value Date   WBC 6.5 03/03/2024   HGB 12.4 (L) 03/03/2024   HCT 37.0 (L) 03/03/2024   MCV 88.5 03/03/2024   PLT 201 03/03/2024   No results for input(s): "NA", "K", "CL", "CO2", "BUN", "CREATININE", "CALCIUM ", "PROT", "BILITOT", "ALKPHOS", "ALT", "AST", "GLUCOSE" in the last 168 hours.  Invalid input(s): "LABALBU"    Radiology:  EKG: Normal sinus rhythm  FOLLOW UP PLANS AND APPOINTMENTS  Allergies as of 02/29/2024   No Known Allergies      Medication List     ASK your doctor about these medications    amLODipine  5 MG tablet Commonly known as: NORVASC  Take 5 mg by mouth daily.   aspirin  EC 81 MG tablet Take 81 mg by mouth daily.   atorvastatin  80 MG tablet Commonly known as: LIPITOR  Take 1 tablet (80 mg total) by mouth daily at 6 PM.   clopidogrel  75 MG tablet Commonly known as: PLAVIX  Take 1 tablet (75 mg total) by mouth daily with breakfast.   Coenzyme Q10 100 MG Tabs Take 100 mg by mouth every other day.   glimepiride  4 MG tablet Commonly known as: AMARYL  Take 4 mg by mouth daily with breakfast.   levothyroxine  75 MCG tablet Commonly known as: SYNTHROID  Take 75 mcg by mouth daily before breakfast.   metFORMIN  1000 MG tablet Commonly known as: GLUCOPHAGE  Take 1,000 mg by mouth at bedtime.   metoprolol  succinate 50 MG 24 hr tablet Commonly known as: TOPROL -XL Take 1 tablet (50 mg total) by mouth daily. Take with or immediately following a meal.   multivitamin with minerals Tabs tablet Take 1 tablet by mouth daily. Centrum   nitroGLYCERIN  0.4 MG SL tablet Commonly known as: NITROSTAT  Place 1 tablet (0.4 mg total) under the tongue every 5 (five) minutes x 3  doses as needed for chest pain.   pantoprazole  40 MG tablet Commonly known as: PROTONIX  Take 40 mg by mouth daily.   TURMERIC PO Take 1 capsule by mouth daily.        Follow-up Information     Kenneth Cuaresma, MD. Go in 1 week(s).    Specialty: Cardiology Contact information: [address] [facility] [place] [address] [phone]                 BRING ALL MEDICATIONS WITH YOU TO FOLLOW UP APPOINTMENTS  Time spent with patient to include physician time: Signed:  Percival Brace MD, PhD, [facility] 03/29/2024, 7:44 AM

## 2024-04-23 DIAGNOSIS — Z951 Presence of aortocoronary bypass graft: Secondary | ICD-10-CM | POA: Diagnosis not present

## 2024-04-23 DIAGNOSIS — I2510 Atherosclerotic heart disease of native coronary artery without angina pectoris: Secondary | ICD-10-CM | POA: Diagnosis not present

## 2024-04-23 DIAGNOSIS — I252 Old myocardial infarction: Secondary | ICD-10-CM | POA: Diagnosis not present

## 2024-04-23 DIAGNOSIS — E7800 Pure hypercholesterolemia, unspecified: Secondary | ICD-10-CM | POA: Diagnosis not present

## 2024-04-23 DIAGNOSIS — Z9889 Other specified postprocedural states: Secondary | ICD-10-CM | POA: Diagnosis not present

## 2024-04-23 DIAGNOSIS — E1165 Type 2 diabetes mellitus with hyperglycemia: Secondary | ICD-10-CM | POA: Diagnosis not present

## 2024-04-23 DIAGNOSIS — Z955 Presence of coronary angioplasty implant and graft: Secondary | ICD-10-CM | POA: Diagnosis not present

## 2024-04-23 DIAGNOSIS — R079 Chest pain, unspecified: Secondary | ICD-10-CM | POA: Diagnosis not present

## 2024-04-23 DIAGNOSIS — I493 Ventricular premature depolarization: Secondary | ICD-10-CM | POA: Diagnosis not present

## 2024-06-14 ENCOUNTER — Observation Stay

## 2024-06-14 ENCOUNTER — Emergency Department

## 2024-06-14 ENCOUNTER — Other Ambulatory Visit: Payer: Self-pay

## 2024-06-14 ENCOUNTER — Encounter: Payer: Self-pay | Admitting: Emergency Medicine

## 2024-06-14 ENCOUNTER — Observation Stay
Admission: EM | Admit: 2024-06-14 | Discharge: 2024-06-15 | Disposition: A | Discharge status: Home / Self Care | Attending: Family Medicine | Admitting: Family Medicine

## 2024-06-14 DIAGNOSIS — R220 Localized swelling, mass and lump, head: Secondary | ICD-10-CM | POA: Diagnosis not present

## 2024-06-14 DIAGNOSIS — R569 Unspecified convulsions: Secondary | ICD-10-CM | POA: Diagnosis not present

## 2024-06-14 DIAGNOSIS — S065X0A Traumatic subdural hemorrhage without loss of consciousness, initial encounter: Secondary | ICD-10-CM | POA: Diagnosis not present

## 2024-06-14 DIAGNOSIS — R55 Syncope and collapse: Principal | ICD-10-CM | POA: Insufficient documentation

## 2024-06-14 DIAGNOSIS — E039 Hypothyroidism, unspecified: Secondary | ICD-10-CM | POA: Insufficient documentation

## 2024-06-14 DIAGNOSIS — I6782 Cerebral ischemia: Secondary | ICD-10-CM | POA: Diagnosis not present

## 2024-06-14 DIAGNOSIS — E119 Type 2 diabetes mellitus without complications: Secondary | ICD-10-CM | POA: Insufficient documentation

## 2024-06-14 DIAGNOSIS — M25511 Pain in right shoulder: Secondary | ICD-10-CM | POA: Insufficient documentation

## 2024-06-14 DIAGNOSIS — Z8547 Personal history of malignant neoplasm of testis: Secondary | ICD-10-CM | POA: Insufficient documentation

## 2024-06-14 DIAGNOSIS — I6203 Nontraumatic chronic subdural hemorrhage: Secondary | ICD-10-CM

## 2024-06-14 DIAGNOSIS — Z87891 Personal history of nicotine dependence: Secondary | ICD-10-CM | POA: Diagnosis not present

## 2024-06-14 DIAGNOSIS — S61012A Laceration without foreign body of left thumb without damage to nail, initial encounter: Secondary | ICD-10-CM | POA: Diagnosis not present

## 2024-06-14 DIAGNOSIS — Z7984 Long term (current) use of oral hypoglycemic drugs: Secondary | ICD-10-CM | POA: Diagnosis not present

## 2024-06-14 DIAGNOSIS — I6200 Nontraumatic subdural hemorrhage, unspecified: Secondary | ICD-10-CM | POA: Diagnosis not present

## 2024-06-14 DIAGNOSIS — I10 Essential (primary) hypertension: Secondary | ICD-10-CM | POA: Insufficient documentation

## 2024-06-14 DIAGNOSIS — G9389 Other specified disorders of brain: Secondary | ICD-10-CM

## 2024-06-14 DIAGNOSIS — J449 Chronic obstructive pulmonary disease, unspecified: Secondary | ICD-10-CM | POA: Diagnosis not present

## 2024-06-14 DIAGNOSIS — Z951 Presence of aortocoronary bypass graft: Secondary | ICD-10-CM | POA: Diagnosis not present

## 2024-06-14 DIAGNOSIS — S0990XA Unspecified injury of head, initial encounter: Secondary | ICD-10-CM | POA: Diagnosis not present

## 2024-06-14 DIAGNOSIS — Z7982 Long term (current) use of aspirin: Secondary | ICD-10-CM | POA: Insufficient documentation

## 2024-06-14 DIAGNOSIS — R4182 Altered mental status, unspecified: Secondary | ICD-10-CM | POA: Diagnosis not present

## 2024-06-14 DIAGNOSIS — S199XXA Unspecified injury of neck, initial encounter: Secondary | ICD-10-CM | POA: Diagnosis not present

## 2024-06-14 DIAGNOSIS — R404 Transient alteration of awareness: Secondary | ICD-10-CM | POA: Diagnosis not present

## 2024-06-14 DIAGNOSIS — I2510 Atherosclerotic heart disease of native coronary artery without angina pectoris: Secondary | ICD-10-CM | POA: Insufficient documentation

## 2024-06-14 DIAGNOSIS — Z79899 Other long term (current) drug therapy: Secondary | ICD-10-CM | POA: Diagnosis not present

## 2024-06-14 DIAGNOSIS — Z23 Encounter for immunization: Secondary | ICD-10-CM | POA: Diagnosis not present

## 2024-06-14 DIAGNOSIS — G9600 Cerebrospinal fluid leak, unspecified: Secondary | ICD-10-CM | POA: Diagnosis not present

## 2024-06-14 DIAGNOSIS — R410 Disorientation, unspecified: Secondary | ICD-10-CM | POA: Diagnosis not present

## 2024-06-14 DIAGNOSIS — X58XXXA Exposure to other specified factors, initial encounter: Secondary | ICD-10-CM | POA: Diagnosis not present

## 2024-06-14 DIAGNOSIS — R4020 Unspecified coma: Principal | ICD-10-CM

## 2024-06-14 DIAGNOSIS — M19011 Primary osteoarthritis, right shoulder: Secondary | ICD-10-CM | POA: Diagnosis not present

## 2024-06-14 DIAGNOSIS — G319 Degenerative disease of nervous system, unspecified: Secondary | ICD-10-CM | POA: Diagnosis not present

## 2024-06-14 DIAGNOSIS — S0993XA Unspecified injury of face, initial encounter: Secondary | ICD-10-CM | POA: Diagnosis not present

## 2024-06-14 LAB — URINE DRUG SCREEN, QUALITATIVE (ARMC ONLY)
Amphetamines, Ur Screen: NOT DETECTED
Barbiturates, Ur Screen: NOT DETECTED
Benzodiazepine, Ur Scrn: NOT DETECTED
Cannabinoid 50 Ng, Ur ~~LOC~~: NOT DETECTED
Cocaine Metabolite,Ur ~~LOC~~: NOT DETECTED
MDMA (Ecstasy)Ur Screen: NOT DETECTED
Methadone Scn, Ur: NOT DETECTED
Opiate, Ur Screen: NOT DETECTED
Phencyclidine (PCP) Ur S: NOT DETECTED
Tricyclic, Ur Screen: NOT DETECTED

## 2024-06-14 LAB — GLUCOSE, CAPILLARY
Glucose-Capillary: 125 mg/dL — ABNORMAL HIGH (ref 70–99)
Glucose-Capillary: 156 mg/dL — ABNORMAL HIGH (ref 70–99)

## 2024-06-14 LAB — COMPREHENSIVE METABOLIC PANEL WITH GFR
ALT: 28 U/L (ref 0–44)
AST: 38 U/L (ref 15–41)
Albumin: 3.5 g/dL (ref 3.5–5.0)
Alkaline Phosphatase: 58 U/L (ref 38–126)
Anion gap: 10 (ref 5–15)
BUN: 17 mg/dL (ref 8–23)
CO2: 25 mmol/L (ref 22–32)
Calcium: 8.9 mg/dL (ref 8.9–10.3)
Chloride: 103 mmol/L (ref 98–111)
Creatinine, Ser: 1.06 mg/dL (ref 0.61–1.24)
GFR, Estimated: >60 mL/min (ref >60)
Glucose, Bld: 142 mg/dL — ABNORMAL HIGH (ref 70–99)
Potassium: 4.1 mmol/L (ref 3.5–5.1)
Sodium: 138 mmol/L (ref 135–145)
Total Bilirubin: 0.8 mg/dL (ref 0.0–1.2)
Total Protein: 6.9 g/dL (ref 6.5–8.1)

## 2024-06-14 LAB — ETHANOL: Alcohol, Ethyl (B): <15 mg/dL (ref <15)

## 2024-06-14 LAB — AMMONIA: Ammonia: 16 umol/L (ref 9–35)

## 2024-06-14 LAB — MAGNESIUM: Magnesium: 2 mg/dL (ref 1.7–2.4)

## 2024-06-14 LAB — RAPID HIV SCREEN (HIV 1/2 AB+AG)
HIV 1/2 Antibodies: NONREACTIVE
HIV-1 P24 Antigen - HIV24: NONREACTIVE

## 2024-06-14 LAB — CBC WITH DIFFERENTIAL/PLATELET
Abs Immature Granulocytes: 0.02 K/uL (ref 0.00–0.07)
Basophils Absolute: 0.1 K/uL (ref 0.0–0.1)
Basophils Relative: 1 %
Eosinophils Absolute: 1 K/uL — ABNORMAL HIGH (ref 0.0–0.5)
Eosinophils Relative: 11 %
HCT: 36.3 % — ABNORMAL LOW (ref 39.0–52.0)
Hemoglobin: 11.7 g/dL — ABNORMAL LOW (ref 13.0–17.0)
Immature Granulocytes: 0 %
Lymphocytes Relative: 14 %
Lymphs Abs: 1.3 K/uL (ref 0.7–4.0)
MCH: 29.1 pg (ref 26.0–34.0)
MCHC: 32.2 g/dL (ref 30.0–36.0)
MCV: 90.3 fL (ref 80.0–100.0)
Monocytes Absolute: 0.6 K/uL (ref 0.1–1.0)
Monocytes Relative: 7 %
Neutro Abs: 5.9 K/uL (ref 1.7–7.7)
Neutrophils Relative %: 67 %
Platelets: 197 K/uL (ref 150–400)
RBC: 4.02 MIL/uL — ABNORMAL LOW (ref 4.22–5.81)
RDW: 15.1 % (ref 11.5–15.5)
WBC: 8.9 K/uL (ref 4.0–10.5)
nRBC: 0 % (ref 0.0–0.2)

## 2024-06-14 LAB — CK
Total CK: 40 U/L — ABNORMAL LOW (ref 49–397)
Total CK: 56 U/L (ref 49–397)

## 2024-06-14 LAB — URINALYSIS, W/ REFLEX TO CULTURE (INFECTION SUSPECTED)
Bacteria, UA: NONE SEEN
Bilirubin Urine: NEGATIVE
Glucose, UA: NEGATIVE mg/dL
Hgb urine dipstick: NEGATIVE
Ketones, ur: NEGATIVE mg/dL
Leukocytes,Ua: NEGATIVE
Nitrite: NEGATIVE
Protein, ur: 30 mg/dL — AB
RBC / HPF: 0 RBC/hpf (ref 0–5)
Specific Gravity, Urine: 1.016 (ref 1.005–1.030)
Squamous Epithelial / HPF: 0 /HPF (ref 0–5)
pH: 6 (ref 5.0–8.0)

## 2024-06-14 LAB — TROPONIN I (HIGH SENSITIVITY)
Troponin I (High Sensitivity): 14 ng/L (ref <18)
Troponin I (High Sensitivity): 30 ng/L — ABNORMAL HIGH (ref <18)

## 2024-06-14 LAB — TSH: TSH: 1.871 u[IU]/mL (ref 0.350–4.500)

## 2024-06-14 LAB — VITAMIN B12: Vitamin B-12: 456 pg/mL (ref 180–914)

## 2024-06-14 MED ORDER — INSULIN ASPART 100 UNIT/ML IJ SOLN
0.0000 [IU] | Freq: Three times a day (TID) | INTRAMUSCULAR | Status: DC
  Administered 2024-06-15 (×2): 3 [IU] via SUBCUTANEOUS
  Filled 2024-06-14 (×2): qty 1

## 2024-06-14 MED ORDER — GADOBUTROL 1 MMOL/ML IV SOLN
7.5000 mL | Freq: Once | INTRAVENOUS | Status: AC | PRN
  Administered 2024-06-14: 7.5 mL via INTRAVENOUS

## 2024-06-14 MED ORDER — TETANUS-DIPHTH-ACELL PERTUSSIS 5-2.5-18.5 LF-MCG/0.5 IM SUSY: 0.5000 mL | PREFILLED_SYRINGE | Freq: Once | INTRAMUSCULAR | Status: DC

## 2024-06-14 MED ORDER — ATORVASTATIN CALCIUM 20 MG PO TABS
80.0000 mg | ORAL_TABLET | Freq: Every day | ORAL | Status: DC
  Administered 2024-06-14: 80 mg via ORAL
  Filled 2024-06-14: qty 4

## 2024-06-14 MED ORDER — LIDOCAINE HCL (PF) 1 % IJ SOLN
5.0000 mL | Freq: Once | INTRAMUSCULAR | Status: AC
  Administered 2024-06-14: 5 mL via INTRADERMAL
  Filled 2024-06-14: qty 5

## 2024-06-14 MED ORDER — HYDROCODONE-ACETAMINOPHEN 5-325 MG PO TABS
1.0000 | ORAL_TABLET | Freq: Four times a day (QID) | ORAL | Status: DC | PRN
  Administered 2024-06-14 – 2024-06-15 (×3): 1 via ORAL
  Filled 2024-06-14 (×3): qty 1

## 2024-06-14 MED ORDER — LEVOTHYROXINE SODIUM 75 MCG PO TABS
75.0000 ug | ORAL_TABLET | Freq: Every day | ORAL | Status: DC
  Administered 2024-06-14 – 2024-06-15 (×2): 75 ug via ORAL
  Filled 2024-06-14 (×2): qty 3
  Filled 2024-06-14 (×2): qty 1

## 2024-06-14 MED ORDER — BACITRACIN ZINC 500 UNIT/GM EX OINT
TOPICAL_OINTMENT | Freq: Once | CUTANEOUS | Status: AC
  Administered 2024-06-14: 1 via TOPICAL
  Filled 2024-06-14: qty 0.9

## 2024-06-14 MED ORDER — INSULIN ASPART 100 UNIT/ML IJ SOLN: 0.0000 [IU] | Freq: Every day | INTRAMUSCULAR | Status: DC

## 2024-06-14 MED ORDER — MORPHINE SULFATE (PF) 2 MG/ML IV SOLN
2.0000 mg | INTRAVENOUS | Status: DC | PRN
  Filled 2024-06-14: qty 1

## 2024-06-14 MED ORDER — ACETAMINOPHEN 325 MG PO TABS
650.0000 mg | ORAL_TABLET | Freq: Four times a day (QID) | ORAL | Status: DC | PRN
  Administered 2024-06-14: 650 mg via ORAL
  Filled 2024-06-14: qty 2

## 2024-06-14 MED ORDER — EZETIMIBE 10 MG PO TABS
10.0000 mg | ORAL_TABLET | Freq: Every day | ORAL | Status: DC
  Administered 2024-06-14 – 2024-06-15 (×2): 10 mg via ORAL
  Filled 2024-06-14 (×2): qty 1

## 2024-06-14 MED ORDER — SODIUM CHLORIDE 0.9 % IV BOLUS (SEPSIS)
1000.0000 mL | Freq: Once | INTRAVENOUS | Status: AC
  Administered 2024-06-14: 1000 mL via INTRAVENOUS

## 2024-06-14 MED ORDER — TETANUS-DIPHTH-ACELL PERTUSSIS 5-2.5-18.5 LF-MCG/0.5 IM SUSY
0.5000 mL | PREFILLED_SYRINGE | Freq: Once | INTRAMUSCULAR | Status: AC
  Administered 2024-06-14: 0.5 mL via INTRAMUSCULAR
  Filled 2024-06-14: qty 0.5

## 2024-06-14 MED ORDER — ASPIRIN 81 MG PO TBEC
81.0000 mg | DELAYED_RELEASE_TABLET | Freq: Every day | ORAL | Status: DC
  Administered 2024-06-14 – 2024-06-15 (×2): 81 mg via ORAL
  Filled 2024-06-14 (×2): qty 1

## 2024-06-14 MED ORDER — FENTANYL CITRATE PF 50 MCG/ML IJ SOSY
50.0000 ug | PREFILLED_SYRINGE | Freq: Once | INTRAMUSCULAR | Status: AC
  Administered 2024-06-14: 50 ug via INTRAVENOUS
  Filled 2024-06-14: qty 1

## 2024-06-14 NOTE — Progress Notes (Signed)
 Patient brought to floor via wheelchair, by transport personnel, accompanied by friend. Made comfortable in bed, call bell and side table within reach . I.v intact.

## 2024-06-14 NOTE — Plan of Care (Signed)
  Problem: Education: Goal: Knowledge of General Education information will improve Description: Including pain rating scale, medication(s)/side effects and non-pharmacologic comfort measures Outcome: Progressing   Problem: Health Behavior/Discharge Planning: Goal: Ability to manage health-related needs will improve Outcome: Progressing   Problem: Clinical Measurements: Goal: Ability to maintain clinical measurements within normal limits will improve Outcome: Progressing   Problem: Activity: Goal: Risk for activity intolerance will decrease Outcome: Progressing   Problem: Nutrition: Goal: Adequate nutrition will be maintained Outcome: Progressing   Problem: Coping: Goal: Level of anxiety will decrease Outcome: Progressing   Problem: Elimination: Goal: Will not experience complications related to bowel motility Outcome: Progressing   Problem: Pain Managment: Goal: General experience of comfort will improve and/or be controlled Outcome: Progressing   Problem: Safety: Goal: Ability to remain free from injury will improve Outcome: Progressing   Problem: Skin Integrity: Goal: Risk for impaired skin integrity will decrease Outcome: Progressing   Problem: Education: Goal: Ability to describe self-care measures that may prevent or decrease complications (Diabetes Survival Skills Education) will improve Outcome: Progressing   Problem: Coping: Goal: Ability to adjust to condition or change in health will improve Outcome: Progressing   Problem: Metabolic: Goal: Ability to maintain appropriate glucose levels will improve Outcome: Progressing   Problem: Nutritional: Goal: Maintenance of adequate nutrition will improve Outcome: Progressing   Problem: Skin Integrity: Goal: Risk for impaired skin integrity will decrease Outcome: Progressing   Problem: Tissue Perfusion: Goal: Adequacy of tissue perfusion will improve Outcome: Progressing

## 2024-06-14 NOTE — Progress Notes (Signed)
 Eeg done

## 2024-06-14 NOTE — ED Triage Notes (Addendum)
 Ems reports pt was found face-down in bedroom floor and altered upon their arrival. Pt reported to be very confused and not able to follow commands and noted to be incontinent of urine. Pt is now more alert and able to follow commands. Pt unable to recall event of what happened tonight. Unknown if hit head or LOC, pt states he is on blood thinner but unsure of which one. LKN was 2100 per son.  LAC on left thumb And skin tear on right hand.

## 2024-06-14 NOTE — ED Provider Notes (Signed)
 [facility] Provider Note    Event Date/Time   First MD Initiated Contact with Patient 06/14/24 0159     (approximate)   History   Fall and Altered Mental Status   HPI  Jason Mclean. is a 78 y.o. male with history of hypertension, hyperlipidemia, diabetes, CAD status post multiple stents and CABG, hypothyroidism, prior history of testicular cancer currently in remission who presents to the emergency department after he had an episode of loss of consciousness and was found unresponsive on the floor of his home.  Patient states that his son lives with him.  Son reports he found him on the bedroom floor.  Patient states he does not know what happened and states the only thing that he remembers was getting on a stretcher with EMS.  He does not know if he tripped or if he passed out or if he had a seizure.  He has a skin tear to the right dorsal hand and a laceration to the left volar thumb.  He is able to fully flex this thumb.  He reports he is on Plavix .  No headache, neck or back pain.  He is not having any chest pain or shortness of breath and has not had any recent fevers, cough, vomiting, diarrhea.  No prior history of seizures.  Family reports patient was initially confused but now back to his baseline.  Prior to this happening his son states that he saw him around 9 or 9:30 PM and he was in his normal state of health.   History provided by patient, family, EMS.    Past Medical History:  Diagnosis Date   Coronary artery disease    s/p three coronary stents   Diabetes mellitus without complication (HCC)    Hypercholesterolemia    Hypertension    Hypothyroidism    Testicular cancer [facility])     Past Surgical History:  Procedure Laterality Date   CATARACT EXTRACTION W/PHACO Left 01/27/2022   Procedure: CATARACT EXTRACTION PHACO AND INTRAOCULAR LENS PLACEMENT (IOC) LEFT DIABETIC 6.17 01:17.1;  Surgeon: Mittie Gaskin, MD;  Location: [facility];  Service: Ophthalmology;  Laterality: Left;  Diabetic   CATARACT EXTRACTION W/PHACO Right 02/10/2022   Procedure: CATARACT EXTRACTION PHACO AND INTRAOCULAR LENS PLACEMENT (IOC) RIGHT DIABETIC 10.48 01:19.8;  Surgeon: Mittie Gaskin, MD;  Location: [facility];  Service: Ophthalmology;  Laterality: Right;  Diabetic   CORONARY ANGIOPLASTY WITH STENT PLACEMENT     CORONARY BALLOON ANGIOPLASTY N/A 01/29/2020   Procedure: CORONARY STENT INTERVENTION;  Surgeon: Ammon Blunt, MD;  Location: [facility] INVASIVE CV LAB;  Service: Cardiovascular;  Laterality: N/A;   CORONARY STENT INTERVENTION N/A 06/16/2018   Procedure: CORONARY STENT INTERVENTION;  Surgeon: Ammon Blunt, MD;  Location: [facility] INVAS[facility] CV LAB;  Service: Cardiovascular;  Laterality: N/A;   LEFT HEART CATH AND CORONARY ANGIOGRAPHY N/A 06/16/2018   Procedure: LEFT HEART CATH AND CORONARY ANGIOGRAPHY;  Surgeon: Ammon Blunt, MD;  Location: [facility] INVAS[facility] CV LAB;  Service: Cardiovascular;  Laterality: N/A;   LEFT HEART CATH AND CORONARY ANGIOGRAPHY Left 01/29/2020   Procedure: LEFT HEART CATH AND CORONARY ANGIOGRAPHY;  Surgeon: Ammon Blunt, MD;  Location: [facility] INV[facility] CV [facility];  Service: Cardiovascular;  Laterality: Left;   LEFT HEART CATH AND CORONARY ANGIOGRAPHY Left 02/29/2024   Procedure: LEFT HEART CATH AND CORONARY ANGIOGRAPHY;  Surgeon: Ammon Blunt, MD;  Location: [facility] INVAS[facility] CV LAB;  Service: Cardiovascular;  Laterality: Left;    MEDICATIONS:  Prior to Admission medications  Medication Sig Start Date End Date Taking? Authorizing Provider  amLODipine  (NORVASC ) 5 MG tablet Take 5 mg by mouth daily. 11/24/23 11/23/24  [provider]  aspirin  EC 81 MG tablet Take 81 mg by mouth daily.     [provider]  atorvastatin  (LIPITOR ) 80 MG tablet Take 1 tablet (80 mg total) by mouth daily at 6 PM. 06/17/18   Tobie Press, MD  clopidogrel  (PLAVIX ) 75 MG tablet Take 1 tablet (75 mg  total) by mouth daily with breakfast. Patient taking differently: Take 75 mg by mouth at bedtime. 01/30/20   Paraschos, Alexander, MD  Coenzyme Q10 100 MG TABS Take 100 mg by mouth every other day.     [provider]  ezetimibe  (ZETIA ) 10 MG tablet Take 1 tablet (10 mg total) by mouth daily. 03/04/24   Henry Manuelita NOVAK, NP  glimepiride  (AMARYL ) 4 MG tablet Take 4 mg by mouth daily with breakfast. 12/26/23   [provider]  heparin  25000 UT/250ML infusion Inject 1,400 Units/hr into the vein continuous. 03/03/24   Henry Manuelita NOVAK, NP  isosorbide  mononitrate (IMDUR ) 60 MG 24 hr tablet Take 60 mg by mouth daily. Patient not taking: Reported on 03/01/2024    [provider]  levothyroxine  (SYNTHROID , LEVOTHROID) 75 MCG tablet Take 75 mcg by mouth daily before breakfast.  03/28/18   [provider]  losartan  (COZAAR ) 100 MG tablet Take 50 mg by mouth daily.    [provider]  metFORMIN  (GLUCOPHAGE ) 1000 MG tablet Take 1,000 mg by mouth at bedtime. 01/06/24 01/05/25  [provider]  metoprolol  succinate (TOPROL -XL) 50 MG 24 hr tablet Take 1 tablet (50 mg total) by mouth daily. Take with or immediately following a meal. Patient taking differently: Take 75 mg by mouth daily. 01/24/24   Amin, Sumayya, MD  Multiple Vitamin (MULTIVITAMIN WITH MINERALS) TABS tablet Take 1 tablet by mouth daily. Centrum    [provider]  nitroGLYCERIN  (NITROSTAT ) 0.4 MG SL tablet Place 1 tablet (0.4 mg total) under the tongue every 5 (five) minutes x 3 doses as needed for chest pain. 01/23/24   Amin, Sumayya, MD  pantoprazole  (PROTONIX ) 40 MG tablet Take 40 mg by mouth daily. 11/24/23 11/23/24  [provider]  RYBELSUS 14 MG TABS Take 1 tablet by mouth daily. 02/09/24   [provider]  TURMERIC PO Take 1 capsule by mouth daily.    [provider]  VITAMIN D PO Take 1 capsule by mouth at bedtime.    [provider]    Physical Exam    Triage Vital Signs: ED Triage Vitals  Encounter Vitals Group     BP 06/14/24 0203 (!) 178/84     Girls Systolic BP Percentile --      Girls Diastolic BP Percentile --      Boys Systolic BP Percentile --      Boys Diastolic BP Percentile --      Pulse Rate 06/14/24 0203 90     Resp 06/14/24 0203 18     Temp --      Temp src --      SpO2 06/14/24 0203 97 %     Weight --      Height --      Head Circumference --      Peak Flow --      Pain Score 06/14/24 0205 8     Pain Loc --      Pain Education --  Exclude from Growth Chart --     Most recent vital signs: Vitals:   06/14/24 0700 06/14/24 0730  BP: (!) 157/80 (!) 140/78  Pulse: 84   Resp: (!) 7   Temp:    SpO2: 97%      CONSTITUTIONAL: Alert, responds appropriately to questions.  Elderly, oriented x 3 HEAD: Normocephalic; atraumatic EYES: Conjunctivae clear, PERRL, EOMI ENT: normal nose; no rhinorrhea; moist mucous membranes; pharynx without lesions noted; no dental injury; no septal hematoma, no epistaxis; no facial deformity or bony tenderness NECK: Supple, no midline spinal tenderness, step-off or deformity; trachea midline CARD: RRR; S1 and S2 appreciated; no murmurs, no clicks, no rubs, no gallops RESP: Normal chest excursion without splinting or tachypnea; breath sounds clear and equal bilaterally; no wheezes, no rhonchi, no rales; no hypoxia or respiratory distress CHEST:  chest wall stable, no crepitus or ecchymosis or deformity, nontender to palpation; no flail chest ABD/GI: Non-distended; soft, non-tender, no rebound, no guarding; no ecchymosis or other lesions noted PELVIS:  stable, nontender to palpation BACK:  The back appears normal; no midline spinal tenderness, step-off or deformity EXT: Patient has a a 4 cm laceration to the volar left thumb that does not involve the bone or tendon.  Normal capillary refill.  Normal sensation.  Large skin tear to the dorsal right hand.  Tender over both hands.  No  joint effusion.  Compartments soft.  Extremities warm and well-perfused. SKIN: Normal color for age and race; warm NEURO: No facial asymmetry, normal speech, moving all extremities equally  ED Results / Procedures / Treatments   LABS: (all labs ordered are listed, but only abnormal results are displayed) Labs Reviewed  CBC WITH DIFFERENTIAL/PLATELET - Abnormal; Notable for the following components:      Result Value   RBC 4.02 (*)    Hemoglobin 11.7 (*)    HCT 36.3 (*)    Eosinophils Absolute 1.0 (*)    All other components within normal limits  COMPREHENSIVE METABOLIC PANEL WITH GFR - Abnormal; Notable for the following components:   Glucose, Bld 142 (*)    All other components within normal limits  URINALYSIS, W/ REFLEX TO CULTURE (INFECTION SUSPECTED) - Abnormal; Notable for the following components:   Color, Urine YELLOW (*)    APPearance CLEAR (*)    Protein, ur 30 (*)    All other components within normal limits  CK - Abnormal; Notable for the following components:   Total CK 40 (*)    All other components within normal limits  MAGNESIUM  URINE DRUG SCREEN, QUALITATIVE ([facility] ONLY)  VITAMIN B12  VITAMIN B1  RAPID HIV SCREEN (HIV 1/2 AB+AG)  RPR  AMMONIA  TSH  ETHANOL  CK  TROPONIN I (HIGH SENSITIVITY)  TROPONIN I (HIGH SENSITIVITY)     EKG:   EKG Interpretation Date/Time:  Thursday June 14 2024 02:05:07 EDT Ventricular Rate:  89 PR Interval:  224 QRS Duration:  96 QT Interval:  363 QTC Calculation: 442 R Axis:   51  Text Interpretation: Sinus rhythm Prolonged PR interval Anterior infarct, old Nonspecific T abnormalities, lateral leads Confirmed by Neomi Neptune [id number]) on 06/14/2024 2:24:41 AM         RADIOLOGY: My personal review and interpretation of imaging: CT head, face and neck showed no acute traumatic injury.  X-rays of both hands show no fracture.  I have personally reviewed all radiology reports. CT Maxillofacial Wo Contrast Result Date:  06/14/2024 EXAM: CT OF THE FACE WITHOUT CONTRAST 06/14/2024  07:32:20 AM TECHNIQUE: CT of the face was performed without the administration of intravenous contrast. Multiplanar reformatted images are provided for review. Automated exposure control, iterative reconstruction, and/or weight based adjustment of the mA/kV was utilized to reduce the radiation dose to as low as reasonably achievable. COMPARISON: None available. CLINICAL HISTORY: Facial trauma, blunt. EMS reports patient was found face-down on bedroom floor and altered upon their arrival. Patient is now more alert and able to follow commands but unable to recall the event. Unknown if patient hit head or lost consciousness. Patient states he is on blood thinner but unsure of which one. FINDINGS: FACIAL BONES: No acute facial fracture. No mandibular dislocation. No suspicious bone lesion. ORBITS: Bilateral lens replacements are noted. The globes and orbits are otherwise within normal limits. No acute traumatic injury. No inflammatory change. SINUSES AND MASTOIDS: Chronic mucosal thickening is present in the dominant left sphenoid sinus. Mild mucosal thickening is present throughout the mastoid air cells. Mild mucosal thickening is present in the left maxillary sinus. No fluid levels are present. SOFT TISSUES: Mild left periorbital soft tissue swelling is present. No acute abnormality. IMPRESSION: 1. No acute facial fracture. 2. Mild left periorbital soft tissue swelling. Electronically signed by: Lonni Necessary MD 06/14/2024 07:51 AM EDT RP Workstation: [id number]   CT HEAD WO CONTRAST ( ) Result Date: 06/14/2024 EXAM: CT HEAD AND CERVICAL SPINE 06/14/2024 03:21:09 AM TECHNIQUE: CT of the head and cervical spine was performed without the administration of intravenous contrast. Multiplanar reformatted images are provided for review. Automated exposure control, iterative reconstruction, and/or weight based adjustment of the mA/kV was utilized to reduce  the radiation dose to as low as reasonably achievable. COMPARISON: 05/24/2011 CLINICAL HISTORY: Head trauma, minor (Age >= 65y). Per ed notes; Ems reports pt was found face-down in bedroom floor and altered upon their arrival. Pt reported to be very confused and not able to follow commands and noted to be incontinent of urine. Pt is now more alert and able to follow commands. Pt unable to recall event of what happened tonight. Unknown if hit head or LOC, pt states he is on blood thinner but unsure of which one. LKN was 2100 per son. FINDINGS: CT HEAD BRAIN AND VENTRICLES: Low density left subdural hematoma/hygroma, chronic, measuring 9 mm in thickness (coronal image 39) with 3 mm rightward midline shift. Global cortical atrophy. Subcortical and periventricular small vessel ischemic changes. No acute intracranial hemorrhage. No abnormal extra-axial fluid collection. Gray-white differentiation is maintained. No hydrocephalus. ORBITS: No acute abnormality. SINUSES AND MASTOIDS: Mild mucosal thickening in the sphenoid sinus. No acute abnormality. SOFT TISSUES AND SKULL: No acute skull fracture. No acute soft tissue abnormality. CT CERVICAL SPINE BONES AND ALIGNMENT: No acute fracture or traumatic malalignment. DEGENERATIVE CHANGES: No significant degenerative changes. SOFT TISSUES: No prevertebral soft tissue swelling. IMPRESSION: 1. Chronic low density left subdural hematoma/hygroma. 3 mm rightward midline shift. 2. No acute intracranial abnormality. Atrophy with small vessel ischemic changes. 3. No traumatic injury to the cervical spine. Electronically signed by: Pinkie Pebbles MD 06/14/2024 03:28 AM EDT RP Workstation: [id number]   CT Cervical Spine Wo Contrast Result Date: 06/14/2024 EXAM: CT HEAD AND CERVICAL SPINE 06/14/2024 03:21:09 AM TECHNIQUE: CT of the head and cervical spine was performed without the administration of intravenous contrast. Multiplanar reformatted images are provided for review.  Automated exposure control, iterative reconstruction, and/or weight based adjustment of the mA/kV was utilized to reduce the radiation dose to as low as reasonably achievable. COMPARISON: 05/24/2011 CLINICAL HISTORY: Head trauma,  minor (Age >= 65y). Per ed notes; Ems reports pt was found face-down in bedroom floor and altered upon their arrival. Pt reported to be very confused and not able to follow commands and noted to be incontinent of urine. Pt is now more alert and able to follow commands. Pt unable to recall event of what happened tonight. Unknown if hit head or LOC, pt states he is on blood thinner but unsure of which one. LKN was 2100 per son. FINDINGS: CT HEAD BRAIN AND VENTRICLES: Low density left subdural hematoma/hygroma, chronic, measuring 9 mm in thickness (coronal image 39) with 3 mm rightward midline shift. Global cortical atrophy. Subcortical and periventricular small vessel ischemic changes. No acute intracranial hemorrhage. No abnormal extra-axial fluid collection. Gray-white differentiation is maintained. No hydrocephalus. ORBITS: No acute abnormality. SINUSES AND MASTOIDS: Mild mucosal thickening in the sphenoid sinus. No acute abnormality. SOFT TISSUES AND SKULL: No acute skull fracture. No acute soft tissue abnormality. CT CERVICAL SPINE BONES AND ALIGNMENT: No acute fracture or traumatic malalignment. DEGENERATIVE CHANGES: No significant degenerative changes. SOFT TISSUES: No prevertebral soft tissue swelling. IMPRESSION: 1. Chronic low density left subdural hematoma/hygroma. 3 mm rightward midline shift. 2. No acute intracranial abnormality. Atrophy with small vessel ischemic changes. 3. No traumatic injury to the cervical spine. Electronically signed by: Pinkie Pebbles MD 06/14/2024 03:28 AM EDT RP Workstation: [id number]   DG Hand Complete Right Result Date: 06/14/2024 CLINICAL DATA:  Status post fall. EXAM: RIGHT HAND - COMPLETE 3+ VIEW COMPARISON:  April 24, 2011 and September 18, 2010  FINDINGS: There is no evidence of fracture or dislocation. There is no evidence of significant arthropathy. The benign subcentimeter bone island is seen within the middle phalanx of the fifth right finger. Soft tissues are unremarkable. IMPRESSION: No acute osseous abnormality. Electronically Signed   By: Suzen Dials M.D.   On: 06/14/2024 02:58   DG Hand Complete Left Result Date: 06/14/2024 CLINICAL DATA:  Status post fall. EXAM: LEFT HAND - COMPLETE 3+ VIEW COMPARISON:  September 18, 2010 FINDINGS: There is no evidence of fracture or dislocation. There is no evidence of arthropathy or other focal bone abnormality. A superficial laceration is seen involving the proximal left thumb. IMPRESSION: Superficial laceration of the proximal left thumb. Electronically Signed   By: Suzen Dials M.D.   On: 06/14/2024 02:56     PROCEDURES:  Critical Care performed: No     Procedures  LACERATION REPAIR Performed by: Josette Landa Mullinax Authorized by: Josette Rochanda Harpham Consent: Verbal consent obtained. Risks and benefits: risks, benefits and alternatives were discussed Consent given by: patient Patient identity confirmed: provided demographic data Prepped and Draped in normal sterile fashion Wound explored  Laceration Location: Left thumb  Laceration Length: 4 cm  No Foreign Bodies seen or palpated  Anesthesia: local infiltration  Local anesthetic: lidocaine  1% without epinephrine   Anesthetic total: 4 ml  Irrigation method: syringe Amount of cleaning: standard  Skin closure: Superficial  Number of sutures: 13  Technique: Area anesthetized using lidocaine  1% with epinephrine . Wound irrigated copiously with sterile saline. Wound then cleaned with Betadine and draped in sterile fashion. Wound closed using 13 simple interrupted sutures with 4-0 Vicryl.  Bacitracin  and sterile dressing applied. Good wound approximation and hemostasis achieved.    Patient tolerance: Patient tolerated the  procedure well with no immediate complications.   IMPRESSION / MDM / ASSESSMENT AND PLAN / ED COURSE  I reviewed the triage vital signs and the nursing notes.  Patient here after an unwitnessed fall, loss  of consciousness, altered mental status.  The patient is on the cardiac monitor to evaluate for evidence of arrhythmia and/or significant heart rate changes.   DIFFERENTIAL DIAGNOSIS (includes but not limited to):   Syncope, seizure, anemia, electrolyte derangement, ACS, arrhythmia, PE, intracranial hemorrhage, hand fractures, lacerations  Patient's presentation is most consistent with acute presentation with potential threat to life or bodily function.  PLAN: Will obtain CT of the head, neck, x-ray of both hands.  Will update his tetanus vaccine.  Will obtain labs, urine.  EKG shows no arrhythmia, interval abnormality, ischemia.  Will keep him on cardiac monitoring.   MEDICATIONS GIVEN IN ED: Medications  Tdap (BOOSTRIX) injection 0.5 mL (0.5 mLs Intramuscular Not Given 06/14/24 0241)  aspirin  EC tablet 81 mg (has no administration in time range)  atorvastatin  (LIPITOR ) tablet 80 mg (has no administration in time range)  ezetimibe  (ZETIA ) tablet 10 mg (has no administration in time range)  levothyroxine  (SYNTHROID ) tablet 75 mcg (has no administration in time range)  sodium chloride  0.9 % bolus 1,000 mL (0 mLs Intravenous Stopped 06/14/24 0317)  Tdap (BOOSTRIX) injection 0.5 mL (0.5 mLs Intramuscular Given 06/14/24 0248)  lidocaine  (PF) (XYLOCAINE ) 1 % injection 5 mL (5 mLs Intradermal Given 06/14/24 0247)  bacitracin  ointment (1 Application Topical Given 06/14/24 0247)  fentaNYL  (SUBLIMAZE ) injection 50 mcg (50 mcg Intravenous Given 06/14/24 0651)     ED COURSE: Patient's labs show hemoglobin of 11.7.  Normal electrolytes, glucose.  Troponin negative.  Second troponin pending.  CK only minimally elevated at 40.  He is getting IV fluids.  CT head, cervical spine, bilateral hands show no  acute abnormality.  He does have a chronic left subdural hematoma with 3 mm of right midline shift.  He is neurologically intact here.  X-rays of both hands show no fracture.  I did clean the skin tear to his right dorsal hand and applied Steri-Strips and a nonadherent dressing.  Repair of the laceration to the left thumb with sutures.  No tendon involvement and he has full flexion and extension in this finger.  Patient now complaining of some facial pain, left jaw pain worse with palpation and movement from his fall.  Will obtain CT of the face to rule out mandibular fracture.   CT face reviewed and interpreted by myself and the radiologist and shows no facial fracture.   CONSULTS:  Consulted and discussed patient's case with hospitalist, Dr. Lawence.  I have recommended admission and consulting physician agrees and will place admission orders.  Patient (and family if present) agree with this plan.   I reviewed all nursing notes, vitals, pertinent previous records.  All labs, EKGs, imaging ordered have been independently reviewed and interpreted by myself.    OUTSIDE RECORDS REVIEWED: Reviewed prior cardiology notes.       FINAL CLINICAL IMPRESSION(S) / ED DIAGNOSES   Final diagnoses:  Loss of consciousness (HCC)  Altered mental status, unspecified altered mental status type     Rx / DC Orders   ED Discharge Orders     None        Note:  This document was prepared using Dragon voice recognition software and may include unintentional dictation errors.   Daksha Koone, Josette SAILOR, DO 06/14/24 [phone]

## 2024-06-14 NOTE — H&P (Addendum)
 History and Physical    Patient: Jason Mclean. FMW:[id number] DOB: 04/25/1946 DOA: 06/14/2024 DOS: the patient was seen and examined on 06/14/2024 PCP: Sadie Manna, MD  Patient coming from: Home    Chief Complaint:  Chief Complaint  Patient presents with   Fall   Altered Mental Status   HPI: Nyal Schachter. is a 78 y.o. male with medical history significant of CAD status post CABG April 2025, COPD, type 2 diabetes, hyperlipidemia, hypertension, hyperlipidemia, hypothyroidism, history of testicular cancer currently in remission, who presents to the emergency department after episode of loss of consciousness.  Reportedly the patient was found unresponsive on the floor of his home.  Patient lives with his son.  The patient reports he was getting ready for bed and opened a newly purchased pillow and that was the last thing he recalls. He does not recall tripping, does not recall any prodrome prior to syncopal episode. Family reports the patient was initially confused but is now back to his baseline.  Last known normal around 9:30 PM per son.  On my evaluation patient is alert and oriented x 4.  He has extensive understanding and recall of his past medical history.  Complaints at this time are musculoskeletal.  Denies any blurred vision, headache, or neurologic complaints.  In the ED head CT was positive for chronic subdural, labs and vitals mostly unremarkable.  Did have significant thumb laceration which was repaired.  Review of Systems: As mentioned in the history of present illness. All other systems reviewed and are negative. Past Medical History:  Diagnosis Date   Coronary artery disease    s/p three coronary stents   Diabetes mellitus without complication (HCC)    Hypercholesterolemia    Hypertension    Hypothyroidism    Testicular cancer [facility])    Past Surgical History:  Procedure Laterality Date   CATARACT EXTRACTION W/PHACO Left 01/27/2022   Procedure: CATARACT  EXTRACTION PHACO AND INTRAOCULAR LENS PLACEMENT (IOC) LEFT DIABETIC 6.17 01:17.1;  Surgeon: Mittie Gaskin, MD;  Location: [facility];  Service: Ophthalmology;  Laterality: Left;  Diabetic   CATARACT EXTRACTION W/PHACO Right 02/10/2022   Procedure: CATARACT EXTRACTION PHACO AND INTRAOCULAR LENS PLACEMENT (IOC) RIGHT DIABETIC 10.48 01:19.8;  Surgeon: Mittie Gaskin, MD;  Location: [facility];  Service: Ophthalmology;  Laterality: Right;  Diabetic   CORONARY ANGIOPLASTY WITH STENT PLACEMENT     CORONARY BALLOON ANGIOPLASTY N/A 01/29/2020   Procedure: CORONARY STENT INTERVENTION;  Surgeon: Ammon Blunt, MD;  Location: [facility] INVASIVE CV LAB;  Service: Cardiovascular;  Laterality: N/A;   CORONARY STENT INTERVENTION N/A 06/16/2018   Procedure: CORONARY STENT INTERVENTION;  Surgeon: Ammon Blunt, MD;  Location: [facility] INVASIVE CV LAB;  Service: Cardiovascular;  Laterality: N/A;   LEFT HEART CATH AND CORONARY ANGIOGRAPHY N/A 06/16/2018   Procedure: LEFT HEART CATH AND CORONARY ANGIOGRAPHY;  Surgeon: Ammon Blunt, MD;  Location: [facility] INVASIVE CV LAB;  Service: Cardiovascular;  Laterality: N/A;   LEFT HEART CATH AND CORONARY ANGIOGRAPHY Left 01/29/2020   Procedure: LEFT HEART CATH AND CORONARY ANGIOGRAPHY;  Surgeon: Ammon Blunt, MD;  Location: [facility] INVASIVE CV LAB;  Service: Cardiovascular;  Laterality: Left;   LEFT HEART CATH AND CORONARY ANGIOGRAPHY Left 02/29/2024   Procedure: LEFT HEART CATH AND CORONARY ANGIOGRAPHY;  Surgeon: Ammon Blunt, MD;  Location: [facility] INVASIVE CV LAB;  Service: Cardiovascular;  Laterality: Left;   Social History:  reports that he quit smoking about 11 years ago. His smoking use included cigarettes. He has never  used smokeless tobacco. He reports that he does not currently use alcohol . He reports that he does not use drugs.  No Known Allergies  Family History  Problem Relation Age of Onset   Strabismus Mother     Stroke Mother    CAD Father     Prior to Admission medications   Medication Sig Start Date End Date Taking? Authorizing Provider  amLODipine  (NORVASC ) 5 MG tablet Take 5 mg by mouth daily. 11/24/23 11/23/24  [provider]  aspirin  EC 81 MG tablet Take 81 mg by mouth daily.     [provider]  atorvastatin  (LIPITOR ) 80 MG tablet Take 1 tablet (80 mg total) by mouth daily at 6 PM. 06/17/18   Tobie Press, MD  clopidogrel  (PLAVIX ) 75 MG tablet Take 1 tablet (75 mg total) by mouth daily with breakfast. Patient taking differently: Take 75 mg by mouth at bedtime. 01/30/20   Paraschos, Alexander, MD  Coenzyme Q10 100 MG TABS Take 100 mg by mouth every other day.     [provider]  ezetimibe  (ZETIA ) 10 MG tablet Take 1 tablet (10 mg total) by mouth daily. 03/04/24   Henry Manuelita NOVAK, NP  glimepiride  (AMARYL ) 4 MG tablet Take 4 mg by mouth daily with breakfast. 12/26/23   [provider]  heparin  25000 UT/250ML infusion Inject 1,400 Units/hr into the vein continuous. 03/03/24   Henry Manuelita NOVAK, NP  isosorbide  mononitrate (IMDUR ) 60 MG 24 hr tablet Take 60 mg by mouth daily. Patient not taking: Reported on 03/01/2024    [provider]  levothyroxine  (SYNTHROID , LEVOTHROID) 75 MCG tablet Take 75 mcg by mouth daily before breakfast.  03/28/18   [provider]  losartan  (COZAAR ) 100 MG tablet Take 50 mg by mouth daily.    [provider]  metFORMIN  (GLUCOPHAGE ) 1000 MG tablet Take 1,000 mg by mouth at bedtime. 01/06/24 01/05/25  [provider]  metoprolol  succinate (TOPROL -XL) 50 MG 24 hr tablet Take 1 tablet (50 mg total) by mouth daily. Take with or immediately following a meal. Patient taking differently: Take 75 mg by mouth daily. 01/24/24   Amin, Sumayya, MD  Multiple Vitamin (MULTIVITAMIN WITH MINERALS) TABS tablet Take 1 tablet by mouth daily. Centrum    [provider]  nitroGLYCERIN  (NITROSTAT ) 0.4 MG SL tablet Place 1  tablet (0.4 mg total) under the tongue every 5 (five) minutes x 3 doses as needed for chest pain. 01/23/24   Amin, Sumayya, MD  pantoprazole  (PROTONIX ) 40 MG tablet Take 40 mg by mouth daily. 11/24/23 11/23/24  [provider]  RYBELSUS 14 MG TABS Take 1 tablet by mouth daily. 02/09/24   [provider]  TURMERIC PO Take 1 capsule by mouth daily.    [provider]  VITAMIN D PO Take 1 capsule by mouth at bedtime.    [provider]    Physical Exam: Vitals:   06/14/24 0500 06/14/24 0630 06/14/24 0700 06/14/24 0730  BP: (!) 169/77 (!) 143/74 (!) 157/80 (!) 140/78  Pulse: 85 84 84   Resp: 20 15 (!) 7   Temp:  98.3 F (36.8 C)    TempSrc:  Oral    SpO2: 99% 97% 97%    Constitutional:  Normal appearance. Non toxic-appearing.  HENT: Dried blood in hairline.  Mild swelling over left side of face.  Mucous membranes are moist.  Eyes:  Extraocular intact. Conjunctivae normal. Pupils are equal, round, and reactive to light.  Cardiovascular: Rate and Rhythm: Normal  rate and regular rhythm.  Pulmonary: Non labored, symmetric rise of chest wall.  Musculoskeletal: Tender to palpation over right shoulder.  Range of motion limited by pain.  Left thumb laceration recently repaired, dried blood appreciated, no active oozing or bleeding Neurological: No focal deficit present. alert. Oriented. Psychiatric: Mood and Affect congruent.   Data Reviewed:     Latest Ref Rng & Units 06/14/2024    2:13 AM 03/03/2024   12:54 AM 03/02/2024    8:58 AM  CBC  WBC 4.0 - 10.5 K/uL 8.9  6.5  7.0   Hemoglobin 13.0 - 17.0 g/dL 88.2  87.5  87.8   Hematocrit 39.0 - 52.0 % 36.3  37.0  35.8   Platelets 150 - 400 K/uL 197  201  200       Latest Ref Rng & Units 06/14/2024    2:13 AM 03/01/2024    1:16 AM 01/22/2024    5:09 PM  BMP  Glucose 70 - 99 mg/dL 857  812  660   BUN 8 - 23 mg/dL 17  15  18    Creatinine 0.61 - 1.24 mg/dL 8.93  8.89  8.99   Sodium 135 - 145 mmol/L 138  134  131    Potassium 3.5 - 5.1 mmol/L 4.1  3.5  4.4   Chloride 98 - 111 mmol/L 103  103  97   CO2 22 - 32 mmol/L 25  21  23    Calcium  8.9 - 10.3 mg/dL 8.9  9.0  9.1    All imaging reviewed.  Assessment and Plan: Syncope and collapse - Patient found down.  Differential broad: Hypoglycemia, mechanical fall, orthostasis, polypharmacy. - TSH, B12, ammonia, HIV, RPR, thiamine, UDS, EtOH ordered and pending - Mildly elevated CK, repeat pending. - Initial troponin I 14, repeat pending - CT with left periorbital soft tissue swelling, no fracture. -EEG ordered - Brain MRI with and without ordered - Monitor on telemetry - Orthostatics ordered  Laceration of left thumb - Repaired in the ED with 13 sutures.  Remove in 1 week. - Local wound care  Left subdural hematoma/hygroma, chronic - Seen on CT.  Measuring 9 mm in thickness with 3 mm rightward midline shift.  Global cortical atrophy.  Subcortical and periventricular small vessel ischemic changes.  No acute intracranial hemorrhage. - Have consulted neurosurgery, appreciate recommendations  Right shoulder pain - Has not been imaged.  Will obtain plain film  Hypertension - Resume home meds gradually.  CAD, prior CABG - Follows outpatient with [facility] cardiology - Status post multiple PCI's, CABG in 03/09/2024. - Resume aspirin , statin  Type 2 diabetes - Sliding scale insulin  for now.  Titrate as needed.  Follow CBGs closely.  COPD - Not currently in exacerbation.  Resume home inhalers  Hypothyroidism - Resume home meds   Advance Care Planning:   Code Status: Full Code patient reports he would like CPR and intubation but does endorse  Consults: Neurosurgery  Family Communication:Partner present at bedside.  Discussion  Severity of Illness: The appropriate patient status for this patient is OBSERVATION. Observation status is judged to be reasonable and necessary in order to provide the required intensity of service to ensure the  patient's safety. The patient's presenting symptoms, physical exam findings, and initial radiographic and laboratory data in the context of their medical condition is felt to place them at decreased risk for further clinical deterioration. Furthermore, it is anticipated that the patient will be medically stable for discharge from the hospital within 2  midnights of admission.   Author: Valecia Beske, DO 06/14/2024 7:59 AM  For on call review [email or web address].

## 2024-06-14 NOTE — Consult Note (Signed)
 Consult requested by:  Dr. Leesa  Consult requested for:  Subdural fluid collection  Primary Physician:  Sadie Manna, MD  History of Present Illness: 06/14/2024 [name] is here today with a chief complaint of syncopal event today.  He was evaluated in the emergency department where CT scan of the head showed a left-sided subdural fluid collection.  He returned to his neurologic baseline.  He has no complaints currently already headache, nausea, vomiting.  He does have right shoulder pain.  He had multiple minor traumatic injuries which were addressed by the emergency department.  He recently bypass procedure and is on therapeutic anticoagulation.  I have utilized the care everywhere function in epic to review the outside records available from external health systems.  Review of Systems:  A 10 point review of systems is negative, except for the pertinent positives and negatives detailed in the HPI.  Past Medical History: Past Medical History:  Diagnosis Date   Coronary artery disease    s/p three coronary stents   Diabetes mellitus without complication (HCC)    Hypercholesterolemia    Hypertension    Hypothyroidism    Testicular cancer [facility])     Past Surgical History: Past Surgical History:  Procedure Laterality Date   CATARACT EXTRACTION W/PHACO Left 01/27/2022   Procedure: CATARACT EXTRACTION PHACO AND INTRAOCULAR LENS PLACEMENT (IOC) LEFT DIABETIC 6.17 01:17.1;  Surgeon: Mittie Gaskin, MD;  Location: [facility];  Service: Ophthalmology;  Laterality: Left;  Diabetic   CATARACT EXTRACTION W/PHACO Right 02/10/2022   Procedure: CATARACT EXTRACTION PHACO AND INTRAOCULAR LENS PLACEMENT (IOC) RIGHT DIABETIC 10.48 01:19.8;  Surgeon: Mittie Gaskin, MD;  Location: [facility];  Service: Ophthalmology;  Laterality: Right;  Diabetic   CORONARY ANGIOPLASTY WITH STENT PLACEMENT     CORONARY BALLOON ANGIOPLASTY N/A 01/29/2020   Procedure:  CORONARY STENT INTERVENTION;  Surgeon: Ammon Blunt, MD;  Location: [facility] INV[facility] CV LAB;  Service: Cardiovascular;  Laterality: N/A;   CORONARY STENT INTERVENTION N/A 06/16/2018   Procedure: CORONARY STENT INTERVENTION;  Surgeon: Ammon Blunt, MD;  Location: [facility];  Service: Cardiovascular;  Laterality: N/A;   LEFT HEART CATH AND CORONARY ANGIOGRAPHY N/A 06/16/2018   Procedure: LEFT HEART CATH AND CORONARY ANGIOGRAPHY;  Surgeon: Ammon Blunt, MD;  Location: [facility];  Service: Cardiovascular;  Laterality: N/A;   LEFT HEART CATH AND CORONARY ANGIOGRAPHY Left 01/29/2020   Procedure: LEFT HEART CATH AND CORONARY ANGIOGRAPHY;  Surgeon: Ammon Blunt, MD;  Location: [facility];  Service: Cardiovascular;  Laterality: Left;   LEFT HEART CATH AND CORONARY ANGIOGRAPHY Left 02/29/2024   Procedure: LEFT HEART CATH AND CORONARY ANGIOGRAPHY;  Surgeon: Ammon Blunt, MD;  Location: [facility] INV[facility];  Service: Cardiovascular;  Laterality: Left;    Allergies: Allergies as of 06/14/2024   (No Known Allergies)    Medications: Current Meds  Medication Sig   amiodarone (PACERONE) 200 MG tablet Take 200 mg by mouth 2 (two) times daily.   amLODipine  (NORVASC ) 5 MG tablet Take 5 mg by mouth daily.   aspirin  EC 81 MG tablet Take 81 mg by mouth daily.    atorvastatin  (LIPITOR ) 80 MG tablet Take 1 tablet (80 mg total) by mouth daily at 6 PM.   clopidogrel  (PLAVIX ) 75 MG tablet Take 1 tablet (75 mg total) by mouth daily with breakfast. (Patient taking differently: Take 75 mg by mouth at bedtime.)   Coenzyme Q10 100 MG TABS Take 100 mg by mouth every other  day.    ezetimibe  (ZETIA ) 10 MG tablet Take 1 tablet (10 mg total) by mouth daily.   glimepiride  (AMARYL ) 4 MG tablet Take 4 mg by mouth daily with breakfast.   isosorbide  mononitrate (IMDUR ) 30 MG 24 hr tablet Take 30 mg by mouth daily.   levothyroxine  (SYNTHROID , LEVOTHROID) 75 MCG tablet  Take 75 mcg by mouth daily before breakfast.    losartan  (COZAAR ) 100 MG tablet Take 50 mg by mouth daily.   metFORMIN  (GLUCOPHAGE ) 1000 MG tablet Take 1,000 mg by mouth at bedtime.   metoprolol  succinate (TOPROL -XL) 50 MG 24 hr tablet Take 1 tablet (50 mg total) by mouth daily. Take with or immediately following a meal. (Patient taking differently: Take 75 mg by mouth daily.)   Metoprolol  Tartrate 37.5 MG TABS Take 37.5 mg by mouth every 12 (twelve) hours.   Multiple Vitamin (MULTIVITAMIN WITH MINERALS) TABS tablet Take 1 tablet by mouth daily. Centrum   nitroGLYCERIN  (NITROSTAT ) 0.4 MG SL tablet Place 1 tablet (0.4 mg total) under the tongue every 5 (five) minutes x 3 doses as needed for chest pain.   pantoprazole  (PROTONIX ) 40 MG tablet Take 40 mg by mouth daily.   RYBELSUS 14 MG TABS Take 1 tablet by mouth daily.   TURMERIC PO Take 1 capsule by mouth daily.   VITAMIN D PO Take 1 capsule by mouth at bedtime.    Social History: Social History   Tobacco Use   Smoking status: Former    Current packs/day: 0.00    Types: Cigarettes    Quit date: 2014    Years since quitting: 11.5   Smokeless tobacco: Never  Vaping Use   Vaping status: Never Used  Substance Use Topics   Alcohol  use: Not Currently   Drug use: Never    Family Medical History: Family History  Problem Relation Age of Onset   Strabismus Mother    Stroke Mother    CAD Father     Physical Examination: Vitals:   06/14/24 1614 06/14/24 1713  BP: (!) 176/80 (!) 150/78  Pulse: 76   Resp: 18   Temp: 98.2 F (36.8 C)   SpO2: 95%     General: Patient is in no apparent distress. Attention to examination is appropriate.  Neck:   Supple.  Full range of motion.  Respiratory: Patient is breathing without any difficulty.   NEUROLOGICAL:     Awake, alert, oriented to person, place, and time.  Speech is clear and fluent.  Cranial Nerves: Pupils equal round and reactive to light.  Facial tone is symmetric.  Facial  sensation is symmetric. Shoulder shrug is symmetric. Tongue protrusion is midline.  There is no pronator drift.  Strength: MAEW 5/5 with R should pain  Hoffman's is absent.   Bilateral upper and lower extremity sensation is intact to light touch.    No evidence of dysmetria noted.  Gait is untested.     Medical Decision Making  Imaging: MRI Brain 06/14/2024 IMPRESSION: 1. CSF intensity subdural collection overlying the left cerebral hemisphere (measuring up to 9 mm in thickness), unchanged in size since the head CT performed earlier today. This may reflect a chronic subdural hematoma or subdural hygroma. Mass effect on the underlying left cerebral hemisphere with 3 mm rightward midline shift, also unchanged. 2. Background parenchymal atrophy and chronic small vessel ischemic disease, as described. This includes a chronic lacunar infarct within the right centrum semiovale/corona radiata. 3. Small-volume fluid within bilateral mastoid air cells.  Electronically Signed   By: Rockey Childs D.O.   On: 06/14/2024 13:15 I have personally reviewed the images and agree with the above interpretation.  Assessment and Plan: [name] is a pleasant 78 y.o. male with left-sided subdural hygroma versus chronic subdural hematoma.  Baseline has no deficits.  I will see him back in clinic in follow-up.  I would not recommend surgical intervention.  He is safe to continue therapeutic anticoagulation as there is no acute component of his fluid collection.  No AEDs needed.  Defer further management to primary and consulting teams.    I have communicated my recommendations to the requesting physician and coordinated care to facilitate these recommendations.     Bria Sparr K. Clois MD, [facility] Neurosurgery

## 2024-06-14 NOTE — Procedures (Signed)
 Patient Name: Jason Mclean.  MRN: [id number]  Epilepsy Attending: Arlin MALVA Krebs  Referring Physician/Provider: Dezii, Alexandra, DO  Date: 06/14/2024 Duration: 32.23 mins  Patient history: 78 yo M patient presenting with unexplained loss of consciousness with amnesia to the event. EEG to evaluate for seizure  Level of alertness: Awake, asleep  AEDs during EEG study: None  Technical aspects: This EEG study was done with scalp electrodes positioned according to the 10-20 International system of electrode placement. Electrical activity was reviewed with band pass filter of 1-70Hz , sensitivity of 7 uV/mm, display speed of 82mm/sec with a 60Hz  notched filter applied as appropriate. EEG data were recorded continuously and digitally stored.  Video monitoring was available and reviewed as appropriate.  Description: The posterior dominant rhythm consists of 8 Hz activity of moderate voltage (25-35 uV) seen predominantly in posterior head regions, symmetric and reactive to eye opening and eye closing. Sleep was characterized by vertex waves, sleep spindles (12 to 14 Hz), maximal frontocentral region.  EEG showed continuous low amplitude 5 to 6 Hz theta slowing in left hemisphere, maximal left frontoparietal region. Hyperventilation and photic stimulation were not performed.     ABNORMALITY - Continuous slow, left hemisphere, maximal left frontoparietal region  IMPRESSION: This study is suggestive of cortical dysfunction arising from left hemisphere, maximal left frontoparietal region likely secondary to underlying structural abnormality. No seizures or epileptiform discharges were seen throughout the recording.  Sayge Salvato O Keonda Dow

## 2024-06-14 NOTE — Plan of Care (Signed)
 Discussed briefly with Dr. Emmie, patient presenting with unexplained loss of consciousness with amnesia to the event.  Head CT with chronic subdural  Altered chronic subdural is managed with serial imaging to confirm stability on an outpatient basis but I would defer to neurosurgery for formal recommendations  Regarding loss of consciousness I agree that seizure would be on the differential and would recommend MRI brain with and without contrast and routine EEG -- full consultation if these studies are abnormal  Also would need full inpatient neurological if he is having frequent episodes concerning for possible seizure

## 2024-06-14 NOTE — ED Notes (Signed)
 Patient getting EEG done in room at this time. Transport will be delayed.

## 2024-06-15 ENCOUNTER — Telehealth: Payer: Self-pay | Admitting: Neurosurgery

## 2024-06-15 ENCOUNTER — Other Ambulatory Visit: Payer: Self-pay | Admitting: Family Medicine

## 2024-06-15 DIAGNOSIS — G9389 Other specified disorders of brain: Secondary | ICD-10-CM

## 2024-06-15 DIAGNOSIS — R55 Syncope and collapse: Secondary | ICD-10-CM | POA: Diagnosis not present

## 2024-06-15 LAB — GLUCOSE, CAPILLARY
Glucose-Capillary: 163 mg/dL — ABNORMAL HIGH (ref 70–99)
Glucose-Capillary: 167 mg/dL — ABNORMAL HIGH (ref 70–99)

## 2024-06-15 LAB — RPR: RPR Ser Ql: NONREACTIVE

## 2024-06-15 MED ORDER — CLOPIDOGREL BISULFATE 75 MG PO TABS
75.0000 mg | ORAL_TABLET | Freq: Every day | ORAL | Status: DC
  Administered 2024-06-15: 75 mg via ORAL
  Filled 2024-06-15: qty 1

## 2024-06-15 MED ORDER — METOPROLOL TARTRATE 25 MG PO TABS
37.5000 mg | ORAL_TABLET | Freq: Two times a day (BID) | ORAL | Status: DC
  Administered 2024-06-15: 37.5 mg via ORAL
  Filled 2024-06-15: qty 2

## 2024-06-15 MED ORDER — LOSARTAN POTASSIUM 50 MG PO TABS
50.0000 mg | ORAL_TABLET | Freq: Every day | ORAL | Status: DC
  Administered 2024-06-15: 50 mg via ORAL
  Filled 2024-06-15: qty 1

## 2024-06-15 MED ORDER — AMLODIPINE BESYLATE 5 MG PO TABS
5.0000 mg | ORAL_TABLET | Freq: Every day | ORAL | Status: DC
Start: 2024-06-15 — End: 2024-06-15
  Administered 2024-06-15: 5 mg via ORAL
  Filled 2024-06-15: qty 1

## 2024-06-15 MED ORDER — AMIODARONE HCL 200 MG PO TABS
200.0000 mg | ORAL_TABLET | Freq: Two times a day (BID) | ORAL | Status: DC
Start: 2024-06-15 — End: 2024-06-15
  Administered 2024-06-15: 200 mg via ORAL
  Filled 2024-06-15: qty 1

## 2024-06-15 NOTE — Evaluation (Signed)
 Occupational Therapy Evaluation Patient Details Name: Jason Mclean. MRN: [id number] DOB: 1946-05-02 Today's Date: 06/15/2024   History of Present Illness   Jason Mclean. is a 78 y.o. male with medical history significant of CAD status post CABG April 2025, COPD, type 2 diabetes, hyperlipidemia, hypertension, hyperlipidemia, hypothyroidism, history of testicular cancer currently in remission, who presents to the emergency department after episode of loss of consciousness.  Reportedly the patient was found unresponsive on the floor of his home.     Clinical Impressions Pt was seen for OT evaluation this date. Prior to hospital admission, pt was independent in all ADL/IADLs. Pt lives with his son in their 2 level home with his bedroom on the 2nd floor. Pt presents to acute OT demonstrating ADL performance and functional mobility not far from his functional baseline (See OT problem list for additional functional deficits). Pt donned bil shoes at the start of session with supervision, steady reaching outside his BOS. Pt Initially required CGA for safety then progressed to supervision during simulated toilet transfer within room. Pt amb with no DME with no reports of dizziness or observed LOB. Pt retired in [employer] with all needs in reach. Pt would benefit from skilled OT services to address noted impairments and functional limitations (see below for any additional details) in order to maximize safety and independence while minimizing falls risk and caregiver burden. Do not anticipate the need for follow up OT services upon acute hospital DC. OT will follow acutely.     If plan is discharge home, recommend the following:   Assistance with cooking/housework;A little help with walking and/or transfers     Functional Status Assessment   Patient has had a recent decline in their functional status and demonstrates the ability to make significant improvements in function in a reasonable and  predictable amount of time.     Equipment Recommendations   None recommended by OT     Recommendations for Other Services         Precautions/Restrictions   Precautions Precautions: Fall Recall of Precautions/Restrictions: Intact Restrictions Weight Bearing Restrictions Per Provider Order: No     Mobility Bed Mobility               General bed mobility comments: Recieved pt sitting on the EOB with PT present    Transfers Overall transfer level: Needs assistance Equipment used: None Transfers: Sit to/from Stand Sit to Stand: Supervision                  Balance Overall balance assessment: Needs assistance Sitting-balance support: No upper extremity supported, Feet supported Sitting balance-Leahy Scale: Good Sitting balance - Comments: steady reaching outside BOS for shoes   Standing balance support: No upper extremity supported, During functional activity Standing balance-Leahy Scale: Good Standing balance comment: Observed during PT session: No loss of balance with ambulation and head turns R/L/up/down, increasing/decreasing speed, and turning 180 degrees and stopping                           ADL either performed or assessed with clinical judgement   ADL Overall ADL's : Needs assistance/impaired     Grooming: Set up;Wash/dry face;Sitting               Lower Body Dressing: Supervision/safety;Sitting/lateral leans   Toilet Transfer: Ambulation;Contact guard assist;Supervision/safety           Functional mobility during ADLs: Contact guard assist;Supervision/safety General ADL Comments:  Initially CGA progressed to supervision during simulated toilet transfer     Vision Baseline Vision/History: 1 Wears glasses                         Pertinent Vitals/Pain Pain Assessment Pain Assessment: 0-10 Pain Score: 3  Pain Location: R shoulder pain when over use Pain Descriptors / Indicators: Discomfort Pain  Intervention(s): Limited activity within patient's tolerance     Extremity/Trunk Assessment Upper Extremity Assessment Upper Extremity Assessment: Generalized weakness;RUE deficits/detail RUE Deficits / Details: R sholder pain when reaching over head, not acute per pt report RUE Coordination: WNL   Lower Extremity Assessment Lower Extremity Assessment: Defer to PT evaluation   Cervical / Trunk Assessment Cervical / Trunk Assessment: Normal   Communication Communication Communication: Impaired Factors Affecting Communication: Hearing impaired   Cognition Arousal: Alert Behavior During Therapy: WFL for tasks assessed/performed Cognition: No apparent impairments             OT - Cognition Comments: A/Ox4                 Following commands: Intact       Cueing  General Comments   Cueing Techniques: Verbal cues  Pt thumb laceration - DCI, bandages noted on R doral aspect of hand   Exercises Exercises: Other exercises Other Exercises Other Exercises: Edu: Role of OT eval, safe ADL completion, fall prevention techniques   Shoulder Instructions      Home Living Family/patient expects to be discharged to:: Private residence Living Arrangements: Children Available Help at Discharge: Family;Available PRN/intermittently Type of Home: House Home Access: Stairs to enter [employer] of Steps: 3 Entrance Stairs-Rails: None Home Layout: Two level;1/2 bath on main level;Bed/bath upstairs Alternate Level Stairs-Number of Steps: 14 Alternate Level Stairs-Rails: Left Bathroom Shower/Tub: Tub/shower unit;Walk-in shower   Bathroom Toilet: Standard     Home Equipment: Agricultural consultant (2 wheels)          Prior Functioning/Environment Prior Level of Function : Independent/Modified Independent             Mobility Comments: No additional recent falls reported.  Independent with ambulation. ADLs Comments: Indep ADL/IADL    OT Problem List:  Decreased strength;Decreased range of motion;Decreased activity tolerance;Impaired balance (sitting and/or standing);Decreased safety awareness   OT Treatment/Interventions: Energy conservation;DME and/or AE instruction;Self-care/ADL training;Balance training;Patient/family education      OT Goals(Current goals can be found in the care plan section)   Acute Rehab OT Goals Patient Stated Goal: Return home OT Goal Formulation: With patient Time For Goal Achievement: 06/29/24 Potential to Achieve Goals: Good ADL Goals Pt Will Perform Grooming: Independently;standing Pt Will Perform Lower Body Dressing: Independently;sit to/from stand Pt Will Transfer to Toilet: Independently;ambulating Pt Will Perform Toileting - Clothing Manipulation and hygiene: Independently;sit to/from stand   OT Frequency:  Min 1X/week    Co-evaluation              AM-PAC OT 6 Clicks Daily Activity     Outcome Measure Help from another person eating meals?: None Help from another person taking care of personal grooming?: None Help from another person toileting, which includes using toliet, bedpan, or urinal?: None Help from another person bathing (including washing, rinsing, drying)?: A Little Help from another person to put on and taking off regular upper body clothing?: None Help from another person to put on and taking off regular lower body clothing?: None 6 Click Score: 23   End of Session Equipment  Utilized During Treatment: Gait belt Nurse Communication: Mobility status  Activity Tolerance: Patient tolerated treatment well Patient left: in chair;with call bell/phone within reach;with chair alarm set;with family/visitor present  OT Visit Diagnosis: Muscle weakness (generalized) (M62.81);Dizziness and giddiness (R42)                Time: 9041-8992 OT Time Calculation (min): 9 min Charges:  OT General Charges $OT Visit: 1 Visit OT Evaluation $OT Eval Low Complexity: 1 Low  Shristi Scheib  M.S. OTR/L  06/15/24, 11:04 AM

## 2024-06-15 NOTE — Progress Notes (Signed)
 All personal belonging given in belonging bag. Wheeled out by transport. Friend went to pick up area to await patient. Left the floor without complaint.

## 2024-06-15 NOTE — Care Management Obs Status (Signed)
 MEDICARE OBSERVATION STATUS NOTIFICATION   Patient Details  Name: Jason Mclean. MRN: [id number] Date of Birth: 03-Jun-1946   Medicare Observation Status Notification Given:  Yes    Dewie Ahart W, CMA 06/15/2024, 10:39 AM

## 2024-06-15 NOTE — Discharge Summary (Signed)
 DISCHARGE SUMMARY    Jason Mclean. FMW:[id number] DOB: February 05, 1946 DOA: 06/14/2024  PCP: Sadie Manna, MD  Admit date: 06/14/2024 Discharge date: 06/15/2024   Recommendations for Outpatient Follow-up:  Follow up with PCP in 1 week for suture removal and to review chronic medications.  Continue chronic medication management. See neurosurgery in clinic for monitoring of subdural hematoma  Hospital Course:  Jason Kohlbeck. is a 78 y.o. male with medical history significant of CAD status post CABG April 2025, COPD, type 2 diabetes, hyperlipidemia, hypertension, hyperlipidemia, hypothyroidism, history of testicular cancer currently in remission, who presents to the emergency department after episode of loss of consciousness.  Reportedly the patient was found unresponsive on the floor of his home.  Patient lives with his son.  The patient reports he was getting ready for bed and opened a newly purchased pillow and that was the last thing he recalls. He does not recall tripping, does not recall any prodrome prior to syncopal episode. Family reports the patient was initially confused but resolved back to his baseline.   In the ED head CT was positive for chronic subdural, labs and vitals mostly unremarkable.  Did have significant thumb laceration which was repaired.  Patient had workup as below with no clear etiology for syncope though orthostatic hypotension was certainly playing a role.  Patient also appears to be on multiple different medications with some confusion of what he is meant to be taking.  Medications were reviewed carefully at discharge patient was provided with new medication list.   Syncope and collapse - Patient found down. - Monitored on telemetry x 24 hours, no arrhythmia - TSH, ammonia, HIV, RPR, B12, UDS, EtOH within normal limits.  Thiamine still pending. - TSH, B12, ammonia, HIV, RPR, thiamine, UDS, EtOH ordered and pending - Mildly elevated CK, not indicative of  rhabdomyolysis - Initial troponin 14, many hours later with minimal rise to 30 - CT with left periorbital soft tissue swelling, no fracture. -Brain imaging as below - Orthostatics: Lying 184/82, standing 139/82.  Pulse with 20 point rise.  Advised patient on safe standing and slow moving  Polypharmacy - Patient has had multiple recent hospital admissions and has multiple subspecialists.  There appears to be some confusion regarding which medications he is meant to be on. Reviewed his most recent cardiology appointment and provided patient with new med reconciliation at discharge  Laceration of left thumb - Repaired in the ED with 13 sutures.  Remove in 1 week. - Local wound care   Left subdural hematoma/hygroma, chronic - Seen on CT.  Measuring 9 mm in thickness with 3 mm rightward midline shift.  Global cortical atrophy.  Subcortical and periventricular small vessel ischemic changes.  No acute intracranial hemorrhage. - Consulted neurosurgery who evaluated the patient.  Neurosurgery will follow the patient in clinic.  No acute surgical intervention needed.  No AEDs needed. - Brain MRI confirms same - EEG with slowing to correlate but no epileptiform activity  Right shoulder pain - No acute fracture.  Hypertension - Resume appropriate home meds at DC   CAD, prior CABG - Follows outpatient with [facility] cardiology - Status post multiple PCI's, CABG in 03/09/2024. - Resume aspirin , statin.  Not meant to be on Plavix  per most recent cardiology notes   Type 2 diabetes - Resume home meds at discharge   COPD - Not currently in exacerbation.  Resume home inhalers   Hypothyroidism - Resume home meds Discharge Instructions  Discharge Instructions  Ambulatory referral to Neurosurgery   Complete by: As directed    Hospital follow-up chronic subdural   Call MD for:  difficulty breathing, headache or visual disturbances   Complete by: As directed    Call MD for:  persistant  dizziness or light-headedness   Complete by: As directed    Call MD for:  persistant nausea and vomiting   Complete by: As directed    Call MD for:  severe uncontrolled pain   Complete by: As directed    Call MD for:  temperature >100.4   Complete by: As directed    Diet general   Complete by: As directed    Discharge instructions   Complete by: As directed    Please review your discharge medication list closely. Continue to monitor your blood pressure and blood glucose closely. Follow up with your primary care physician to review your log and make medication changes as necessary.  See your primary care physician within 1 week to have sutures removed   Increase activity slowly   Complete by: As directed    No wound care   Complete by: As directed       Allergies as of 06/15/2024   No Known Allergies      Medication List     STOP taking these medications    amiodarone 200 MG tablet Commonly known as: PACERONE   amLODipine  5 MG tablet Commonly known as: NORVASC    clopidogrel  75 MG tablet Commonly known as: PLAVIX    heparin  25000 UT/250ML infusion   isosorbide  mononitrate 30 MG 24 hr tablet Commonly known as: IMDUR    losartan  100 MG tablet Commonly known as: COZAAR    metoprolol  succinate 50 MG 24 hr tablet Commonly known as: TOPROL -XL       TAKE these medications    aspirin  EC 81 MG tablet Take 81 mg by mouth daily.   atorvastatin  80 MG tablet Commonly known as: LIPITOR  Take 1 tablet (80 mg total) by mouth daily at 6 PM.   Coenzyme Q10 100 MG Tabs Take 100 mg by mouth every other day.   ezetimibe  10 MG tablet Commonly known as: ZETIA  Take 1 tablet (10 mg total) by mouth daily.   glimepiride  4 MG tablet Commonly known as: AMARYL  Take 4 mg by mouth daily with breakfast.   levothyroxine  75 MCG tablet Commonly known as: SYNTHROID  Take 75 mcg by mouth daily before breakfast.   metFORMIN  1000 MG tablet Commonly known as: GLUCOPHAGE  Take 1,000 mg  by mouth at bedtime.   Metoprolol  Tartrate 37.5 MG Tabs Take 37.5 mg by mouth every 12 (twelve) hours.   multivitamin with minerals Tabs tablet Take 1 tablet by mouth daily. Centrum   nitroGLYCERIN  0.4 MG SL tablet Commonly known as: NITROSTAT  Place 1 tablet (0.4 mg total) under the tongue every 5 (five) minutes x 3 doses as needed for chest pain.   pantoprazole  40 MG tablet Commonly known as: PROTONIX  Take 40 mg by mouth daily.   Rybelsus 14 MG Tabs Generic drug: Semaglutide Take 1 tablet by mouth daily.   spironolactone 25 MG tablet Commonly known as: ALDACTONE Take 12.5 mg by mouth daily.   TURMERIC PO Take 1 capsule by mouth daily.   VITAMIN D PO Take 1 capsule by mouth at bedtime.        No Known Allergies  Consultations:    Procedures/Studies: EEG adult Result Date: 06/14/2024 Shelton Arlin KIDD, MD     06/14/2024  5:20 PM Patient Name: Jason Mclean  Sherlynn Mclean. MRN: [id number] Epilepsy Attending: Arlin MALVA Krebs Referring Physician/Provider: Denzal Meir, DO Date: 06/14/2024 Duration: 32.23 mins Patient history: 78 yo M patient presenting with unexplained loss of consciousness with amnesia to the event. EEG to evaluate for seizure Level of alertness: Awake, asleep AEDs during EEG study: None Technical aspects: This EEG study was done with scalp electrodes positioned according to the 10-20 International system of electrode placement. Electrical activity was reviewed with band pass filter of 1-70Hz , sensitivity of 7 uV/mm, display speed of 70mm/sec with a 60Hz  notched filter applied as appropriate. EEG data were recorded continuously and digitally stored.  Video monitoring was available and reviewed as appropriate. Description: The posterior dominant rhythm consists of 8 Hz activity of moderate voltage (25-35 uV) seen predominantly in posterior head regions, symmetric and reactive to eye opening and eye closing. Sleep was characterized by vertex waves, sleep spindles (12 to 14  Hz), maximal frontocentral region.  EEG showed continuous low amplitude 5 to 6 Hz theta slowing in left hemisphere, maximal left frontoparietal region. Hyperventilation and photic stimulation were not performed.   ABNORMALITY - Continuous slow, left hemisphere, maximal left frontoparietal region IMPRESSION: This study is suggestive of cortical dysfunction arising from left hemisphere, maximal left frontoparietal region likely secondary to underlying structural abnormality. No seizures or epileptiform discharges were seen throughout the recording. Arlin MALVA Krebs   MR BRAIN W WO CONTRAST Result Date: 06/14/2024 CLINICAL DATA:  Provided history: Subdural hematoma. Syncope and collapse. EXAM: MRI HEAD WITHOUT AND WITH CONTRAST TECHNIQUE: Multiplanar, multiecho pulse sequences of the brain and surrounding structures were obtained without and with intravenous contrast. CONTRAST:  7.5mL GADAVIST  GADOBUTROL  1 MMOL/ML IV SOLN COMPARISON:  Head CT 06/14/2024. Brain MRI 01/26/2005. FINDINGS: Brain: Mild generalized cerebral atrophy, new from the prior MRI of 01/26/2005. CSF intensity subdural collection overlying the left cerebral hemisphere, unchanged in size since the head CT performed earlier today and new from the prior MRI. This may reflect a chronic subdural hematoma or subdural hygroma. Mass effect upon the underlying left cerebral hemisphere. 3 mm rightward midline shift, unchanged (remeasured on prior). Chronic lacunar infarct within the right centrum semiovale/corona radiata, new from the prior MRI of 01/26/2005. Wallerian degeneration extending along the adjacent callosal body. Chronic lacunar infarcts within the right basal ganglia and left thalamus, new from the prior MRI. Moderate multifocal T2 FLAIR hyperintense signal abnormality elsewhere within the cerebral white matter, nonspecific but compatible with chronic small vessel ischemic disease. These findings have progressed. Few punctate chronic  microhemorrhages scattered within the supratentorial brain. There is no acute infarct. No evidence of an intracranial mass. No pathologic intracranial enhancement identified. Vascular: Maintained flow voids within the proximal large arterial vessels. Skull and upper cervical spine: No focal worrisome marrow lesion. Sinuses/Orbits: No mass or acute finding within the imaged orbits. Prior bilateral ocular lens replacement. Minimal mucosal thickening within the left maxillary sinus. Mild-to-moderate mucosal thickening within the left sphenoid sinus. Minimal mucosal thickening within left ethmoid air cells. Other: Small-volume fluid within bilateral mastoid air cells. IMPRESSION: 1. CSF intensity subdural collection overlying the left cerebral hemisphere (measuring up to 9 mm in thickness), unchanged in size since the head CT performed earlier today. This may reflect a chronic subdural hematoma or subdural hygroma. Mass effect on the underlying left cerebral hemisphere with 3 mm rightward midline shift, also unchanged. 2. Background parenchymal atrophy and chronic small vessel ischemic disease, as described. This includes a chronic lacunar infarct within the right centrum semiovale/corona radiata. 3. Small-volume fluid  within bilateral mastoid air cells. Electronically Signed   By: Rockey Childs D.O.   On: 06/14/2024 13:15   DG Shoulder Right Result Date: 06/14/2024 EXAM: 1 VIEW XRAY OF THE RIGHT SHOULDER 06/14/2024 09:20:00 AM COMPARISON: 05/24/2011 CLINICAL HISTORY: [id number] Fall 809823. Ems reports pt was found face-down in bedroom floor and altered upon their arrival. Patient now complaining of right shoulder pain. FINDINGS: BONES AND JOINTS: Glenohumeral joint is normally aligned. No acute fracture or dislocation. Mild degenerative changes of the acromioclavicular joint. SOFT TISSUES: No abnormal calcifications. Visualized right hemithorax is unremarkable. IMPRESSION: 1. No acute fracture or dislocation. 2. Mild  degenerative changes of the acromioclavicular joint. Electronically signed by: Rockey Kilts MD 06/14/2024 09:58 AM EDT RP Workstation: [id number]   CT Maxillofacial Wo Contrast Result Date: 06/14/2024 EXAM: CT OF THE FACE WITHOUT CONTRAST 06/14/2024 07:32:20 AM TECHNIQUE: CT of the face was performed without the administration of intravenous contrast. Multiplanar reformatted images are provided for review. Automated exposure control, iterative reconstruction, and/or weight based adjustment of the mA/kV was utilized to reduce the radiation dose to as low as reasonably achievable. COMPARISON: None available. CLINICAL HISTORY: Facial trauma, blunt. EMS reports patient was found face-down on bedroom floor and altered upon their arrival. Patient is now more alert and able to follow commands but unable to recall the event. Unknown if patient hit head or lost consciousness. Patient states he is on blood thinner but unsure of which one. FINDINGS: FACIAL BONES: No acute facial fracture. No mandibular dislocation. No suspicious bone lesion. ORBITS: Bilateral lens replacements are noted. The globes and orbits are otherwise within normal limits. No acute traumatic injury. No inflammatory change. SINUSES AND MASTOIDS: Chronic mucosal thickening is present in the dominant left sphenoid sinus. Mild mucosal thickening is present throughout the mastoid air cells. Mild mucosal thickening is present in the left maxillary sinus. No fluid levels are present. SOFT TISSUES: Mild left periorbital soft tissue swelling is present. No acute abnormality. IMPRESSION: 1. No acute facial fracture. 2. Mild left periorbital soft tissue swelling. Electronically signed by: Lonni Necessary MD 06/14/2024 07:51 AM EDT RP Workstation: [id number]   CT HEAD WO CONTRAST ( ) Result Date: 06/14/2024 EXAM: CT HEAD AND CERVICAL SPINE 06/14/2024 03:21:09 AM TECHNIQUE: CT of the head and cervical spine was performed without the administration of  intravenous contrast. Multiplanar reformatted images are provided for review. Automated exposure control, iterative reconstruction, and/or weight based adjustment of the mA/kV was utilized to reduce the radiation dose to as low as reasonably achievable. COMPARISON: 05/24/2011 CLINICAL HISTORY: Head trauma, minor (Age >= 65y). Per ed notes; Ems reports pt was found face-down in bedroom floor and altered upon their arrival. Pt reported to be very confused and not able to follow commands and noted to be incontinent of urine. Pt is now more alert and able to follow commands. Pt unable to recall event of what happened tonight. Unknown if hit head or LOC, pt states he is on blood thinner but unsure of which one. LKN was 2100 per son. FINDINGS: CT HEAD BRAIN AND VENTRICLES: Low density left subdural hematoma/hygroma, chronic, measuring 9 mm in thickness (coronal image 39) with 3 mm rightward midline shift. Global cortical atrophy. Subcortical and periventricular small vessel ischemic changes. No acute intracranial hemorrhage. No abnormal extra-axial fluid collection. Gray-white differentiation is maintained. No hydrocephalus. ORBITS: No acute abnormality. SINUSES AND MASTOIDS: Mild mucosal thickening in the sphenoid sinus. No acute abnormality. SOFT TISSUES AND SKULL: No acute skull fracture. No acute soft tissue abnormality. CT  CERVICAL SPINE BONES AND ALIGNMENT: No acute fracture or traumatic malalignment. DEGENERATIVE CHANGES: No significant degenerative changes. SOFT TISSUES: No prevertebral soft tissue swelling. IMPRESSION: 1. Chronic low density left subdural hematoma/hygroma. 3 mm rightward midline shift. 2. No acute intracranial abnormality. Atrophy with small vessel ischemic changes. 3. No traumatic injury to the cervical spine. Electronically signed by: Pinkie Pebbles MD 06/14/2024 03:28 AM EDT RP Workstation: [id number]   CT Cervical Spine Wo Contrast Result Date: 06/14/2024 EXAM: CT HEAD AND CERVICAL SPINE  06/14/2024 03:21:09 AM TECHNIQUE: CT of the head and cervical spine was performed without the administration of intravenous contrast. Multiplanar reformatted images are provided for review. Automated exposure control, iterative reconstruction, and/or weight based adjustment of the mA/kV was utilized to reduce the radiation dose to as low as reasonably achievable. COMPARISON: 05/24/2011 CLINICAL HISTORY: Head trauma, minor (Age >= 65y). Per ed notes; Ems reports pt was found face-down in bedroom floor and altered upon their arrival. Pt reported to be very confused and not able to follow commands and noted to be incontinent of urine. Pt is now more alert and able to follow commands. Pt unable to recall event of what happened tonight. Unknown if hit head or LOC, pt states he is on blood thinner but unsure of which one. LKN was 2100 per son. FINDINGS: CT HEAD BRAIN AND VENTRICLES: Low density left subdural hematoma/hygroma, chronic, measuring 9 mm in thickness (coronal image 39) with 3 mm rightward midline shift. Global cortical atrophy. Subcortical and periventricular small vessel ischemic changes. No acute intracranial hemorrhage. No abnormal extra-axial fluid collection. Gray-white differentiation is maintained. No hydrocephalus. ORBITS: No acute abnormality. SINUSES AND MASTOIDS: Mild mucosal thickening in the sphenoid sinus. No acute abnormality. SOFT TISSUES AND SKULL: No acute skull fracture. No acute soft tissue abnormality. CT CERVICAL SPINE BONES AND ALIGNMENT: No acute fracture or traumatic malalignment. DEGENERATIVE CHANGES: No significant degenerative changes. SOFT TISSUES: No prevertebral soft tissue swelling. IMPRESSION: 1. Chronic low density left subdural hematoma/hygroma. 3 mm rightward midline shift. 2. No acute intracranial abnormality. Atrophy with small vessel ischemic changes. 3. No traumatic injury to the cervical spine. Electronically signed by: Pinkie Pebbles MD 06/14/2024 03:28 AM EDT RP  Workstation: [id number]   DG Hand Complete Right Result Date: 06/14/2024 CLINICAL DATA:  Status post fall. EXAM: RIGHT HAND - COMPLETE 3+ VIEW COMPARISON:  April 24, 2011 and September 18, 2010 FINDINGS: There is no evidence of fracture or dislocation. There is no evidence of significant arthropathy. The benign subcentimeter bone island is seen within the middle phalanx of the fifth right finger. Soft tissues are unremarkable. IMPRESSION: No acute osseous abnormality. Electronically Signed   By: Suzen Dials M.D.   On: 06/14/2024 02:58   DG Hand Complete Left Result Date: 06/14/2024 CLINICAL DATA:  Status post fall. EXAM: LEFT HAND - COMPLETE 3+ VIEW COMPARISON:  September 18, 2010 FINDINGS: There is no evidence of fracture or dislocation. There is no evidence of arthropathy or other focal bone abnormality. A superficial laceration is seen involving the proximal left thumb. IMPRESSION: Superficial laceration of the proximal left thumb. Electronically Signed   By: Suzen Dials M.D.   On: 06/14/2024 02:56      Discharge Exam: Vitals:   06/15/24 0806 06/15/24 1145  BP: (!) 164/77 132/73  Pulse: 82 70  Resp: 16 17  Temp: 97.8 F (36.6 C) 97.8 F (36.6 C)  SpO2: 96% 98%   Vitals:   06/15/24 0029 06/15/24 0447 06/15/24 0806 06/15/24 1145  BP: (!) 138/49 ROLLEN)  150/78 (!) 164/77 132/73  Pulse: 77 84 82 70  Resp: 18 19 16 17   Temp: (!) 97.3 F (36.3 C) 98 F (36.7 C) 97.8 F (36.6 C) 97.8 F (36.6 C)  TempSrc:      SpO2: 98% 94% 96% 98%  Weight:      Height:        Constitutional:  Normal appearance. Non toxic-appearing.  HENT: Head Normocephalic and atraumatic.  Mucous membranes are moist.  Eyes:  Extraocular intact. Conjunctivae normal.  Cardiovascular: Rate and Rhythm: Normal rate and regular rhythm.  Pulmonary: Non labored, symmetric rise of chest wall.  Skin: warm and dry. not jaundiced.  Neurological: No focal deficit present. alert. Oriented.  Psychiatric: Mood and Affect  congruent.    The results of significant diagnostics from this hospitalization (including imaging, microbiology, ancillary and laboratory) are listed below for reference.     Microbiology: No results found for this or any previous visit (from the past 240 hours).   Labs: BNP (last 3 results) Recent Labs    03/01/24 0132  BNP 103.8*   Basic Metabolic Panel: Recent Labs  Lab 06/14/24 0213  NA 138  K 4.1  CL 103  CO2 25  GLUCOSE 142*  BUN 17  CREATININE 1.06  CALCIUM  8.9  MG 2.0   Liver Function Tests: Recent Labs  Lab 06/14/24 0213  AST 38  ALT 28  ALKPHOS 58  BILITOT 0.8  PROT 6.9  ALBUMIN 3.5   No results for input(s): LIPASE, AMYLASE in the last 168 hours. Recent Labs  Lab 06/14/24 0820  AMMONIA 16   CBC: Recent Labs  Lab 06/14/24 0213  WBC 8.9  NEUTROABS 5.9  HGB 11.7*  HCT 36.3*  MCV 90.3  PLT 197   Cardiac Enzymes: Recent Labs  Lab 06/14/24 0213 06/14/24 0820  CKTOTAL 40* 56   BNP: Invalid input(s): POCBNP CBG: Recent Labs  Lab 06/14/24 1648 06/14/24 2033 06/15/24 0805 06/15/24 1128  GLUCAP 125* 156* 163* 167*   D-Dimer No results for input(s): DDIMER in the last 72 hours. Hgb A1c No results for input(s): HGBA1C in the last 72 hours. Lipid Profile No results for input(s): CHOL, HDL, LDLCALC, TRIG, CHOLHDL, LDLDIRECT in the last 72 hours. Thyroid  function studies Recent Labs    06/14/24 0820  TSH 1.871   Anemia work up Recent Labs    06/14/24 0820  VITAMINB12 456   Urinalysis    Component Value Date/Time   COLORURINE YELLOW (A) 06/14/2024 0313   APPEARANCEUR CLEAR (A) 06/14/2024 0313   LABSPEC 1.016 06/14/2024 0313   PHURINE 6.0 06/14/2024 0313   GLUCOSEU NEGATIVE 06/14/2024 0313   HGBUR NEGATIVE 06/14/2024 0313   BILIRUBINUR NEGATIVE 06/14/2024 0313   KETONESUR NEGATIVE 06/14/2024 0313   PROTEINUR 30 (A) 06/14/2024 0313   NITRITE NEGATIVE 06/14/2024 0313   LEUKOCYTESUR NEGATIVE  06/14/2024 0313   Sepsis Labs Recent Labs  Lab 06/14/24 0213  WBC 8.9   Microbiology No results found for this or any previous visit (from the past 240 hours).   Time coordinating discharge: 32 min   SIGNED: Lorane Poland, MD  [facility] 06/15/2024, 1:52 PM Pager   If 7PM-7AM, please contact night-coverage

## 2024-06-15 NOTE — Evaluation (Signed)
 Physical Therapy Evaluation Patient Details Name: Jason Mclean. MRN: [id number] DOB: [birth year] Today's Date: 06/15/2024  History of Present Illness  Pt is a 78 y.o. male presenting to hospital 06/14/24 s/p fall (found unresponsive on floor of his home) and with AMS.  Skin tear noted to R dorsal hand and laceration to L volar thumb.  Imaging showing chronic low density L subdural hematoma/hygroma with 3 mm rightward midline shift (unchanged with repeat imaging); chronic lacunar infarct within R centrum semiovale/corona radiata; no acute intracranial abnormality.  Pt admitted with syncope and collapse, L thumb laceration (repaired in ED), chronic L subdural hematoma/hygroma, R shoulder pain, htn, CAD.  PMH includes htn, HLD, DM, CAD s/p multiple stents and CABG (April 2025), h/o testicular CA currently in remission, COPD.  Clinical Impression  Prior to recent medical concerns, pt reports being independent with ambulation; lives with his son in 2 level home (bedroom 2nd floor).  Currently pt is SBA with transfer from bed and CGA to ambulate 200 feet (no AD use).  No loss of balance noted with dynamic standing/walking activities; no c/o dizziness/lightheadedness during session.  Pre-ambulation pt's BP 126/70 (MAP 87) with HR 72 bpm; post ambulation pt's BP 154/72 (MAP 97) with HR 79 bpm.  Pt reporting R shoulder pain/discomfort when lifting R UE but minimal to none at rest during session.  Pt would currently benefit from skilled PT to address noted impairments and functional limitations (see below for any additional details).  Upon hospital discharge, no further PT needs anticipated.    If plan is discharge home, recommend the following: Assistance with cooking/housework;Help with stairs or ramp for entrance;Assist for transportation   Can travel by private vehicle    Yes    Equipment Recommendations None recommended by PT  Recommendations for Other Services       Functional Status Assessment  Patient has had a recent decline in their functional status and demonstrates the ability to make significant improvements in function in a reasonable and predictable amount of time.     Precautions / Restrictions Precautions Precautions: Fall Recall of Precautions/Restrictions: Intact Restrictions Weight Bearing Restrictions Per Provider Order: No      Mobility  Bed Mobility               General bed mobility comments: Deferred (pt sitting on EOB beginning/end of session)    Transfers Overall transfer level: Needs assistance Equipment used: None Transfers: Sit to/from Stand Sit to Stand: Supervision           General transfer comment: pt pushing up on bed rail with R UE to stand from bed; steady controlled sit    Ambulation/Gait Ambulation/Gait assistance: Contact guard assist Gait Distance (Feet): 200 Feet Assistive device: None Gait Pattern/deviations: Step-through pattern Gait velocity: mildly decreased     General Gait Details: steady ambulation; chair follow for safety d/t nursing reporting decreased BP with orthostatics this morning  Stairs            Wheelchair Mobility     Tilt Bed    Modified Rankin (Stroke Patients Only)       Balance Overall balance assessment: Needs assistance Sitting-balance support: No upper extremity supported, Feet supported Sitting balance-Leahy Scale: Good Sitting balance - Comments: steady reaching outside BOS for shoes   Standing balance support: No upper extremity supported, During functional activity Standing balance-Leahy Scale: Good Standing balance comment: No loss of balance with ambulation and head turns R/L/up/down, increasing/decreasing speed, and turning 180 degrees and stopping  Pertinent Vitals/Pain Pain Assessment Pain Assessment: Faces Faces Pain Scale:  (2/10 at rest; 4-6/10 with R UE movement) Pain Location: R shoulder when lifting R UE Pain  Descriptors / Indicators: Discomfort, Grimacing, Guarding Pain Intervention(s): Limited activity within patient's tolerance, Monitored during session    Home Living Family/patient expects to be discharged to:: Private residence Living Arrangements: Children (Pt's son) Available Help at Discharge: Family;Available PRN/intermittently Type of Home: House Home Access: Stairs to enter Entrance Stairs-Rails: None Entrance Stairs-Number of Steps: 3 Alternate Level Stairs-Number of Steps: 14 Home Layout: Two level;1/2 bath on main level;Bed/bath upstairs Home Equipment: Rolling Walker (2 wheels)      Prior Function Prior Level of Function : Independent/Modified Independent             Mobility Comments: No additional recent falls reported.  Independent with ambulation.       Extremity/Trunk Assessment   Upper Extremity Assessment Upper Extremity Assessment: Defer to OT evaluation    Lower Extremity Assessment Lower Extremity Assessment: Generalized weakness    Cervical / Trunk Assessment Cervical / Trunk Assessment: Normal  Communication   Communication Communication: Impaired Factors Affecting Communication: Hearing impaired    Cognition Arousal: Alert Behavior During Therapy: WFL for tasks assessed/performed   PT - Cognitive impairments: No apparent impairments                         Following commands: Intact       Cueing Cueing Techniques: Verbal cues     General Comments General comments (skin integrity, edema, etc.): L thumb laceration (repaired); bandages noted dorsal R hand    Exercises     Assessment/Plan    PT Assessment Patient needs continued PT services  PT Problem List Decreased strength;Decreased mobility;Decreased skin integrity;Pain       PT Treatment Interventions DME instruction;Gait training;Stair training;Functional mobility training;Therapeutic activities;Therapeutic exercise;Balance training;Patient/family education     PT Goals (Current goals can be found in the Care Plan section)  Acute Rehab PT Goals Patient Stated Goal: to go home PT Goal Formulation: With patient Time For Goal Achievement: 06/29/24 Potential to Achieve Goals: Good    Frequency Min 1X/week     Co-evaluation               AM-PAC PT 6 Clicks Mobility  Outcome Measure Help needed turning from your back to your side while in a flat bed without using bedrails?: None Help needed moving from lying on your back to sitting on the side of a flat bed without using bedrails?: None Help needed moving to and from a bed to a chair (including a wheelchair)?: A Little Help needed standing up from a chair using your arms (e.g., wheelchair or bedside chair)?: A Little Help needed to walk in hospital room?: A Little Help needed climbing 3-5 steps with a railing? : A Little 6 Click Score: 20    End of Session Equipment Utilized During Treatment: Gait belt Activity Tolerance: Patient tolerated treatment well Patient left:  (sitting on EOB with OT present for OT evaluation) Nurse Communication: Mobility status;Precautions PT Visit Diagnosis: Muscle weakness (generalized) (M62.81);Pain Pain - Right/Left: Right Pain - part of body: Shoulder    Time: 9063-9042 PT Time Calculation (min) (ACUTE ONLY): 21 min   Charges:   PT Evaluation $PT Eval Low Complexity: 1 Low   PT General Charges $$ ACUTE PT VISIT: 1 Visit        Damien Caulk, PT 06/15/24, 10:19 AM

## 2024-06-15 NOTE — Telephone Encounter (Signed)
 Clois Fret, MD  [facility]surgery Admin F/u in 4 weeks with Head CT

## 2024-06-16 LAB — VITAMIN B1: Vitamin B1 (Thiamine): 156.3 nmol/L (ref 66.5–200.0)

## 2024-06-18 NOTE — Telephone Encounter (Signed)
 Appointment scheduled and patient is aware of CT scan needed.

## 2024-06-21 DIAGNOSIS — S065XAD Traumatic subdural hemorrhage with loss of consciousness status unknown, subsequent encounter: Secondary | ICD-10-CM | POA: Diagnosis not present

## 2024-06-21 DIAGNOSIS — Z09 Encounter for follow-up examination after completed treatment for conditions other than malignant neoplasm: Secondary | ICD-10-CM | POA: Diagnosis not present

## 2024-06-21 DIAGNOSIS — L03012 Cellulitis of left finger: Secondary | ICD-10-CM | POA: Diagnosis not present

## 2024-06-21 DIAGNOSIS — S61012D Laceration without foreign body of left thumb without damage to nail, subsequent encounter: Secondary | ICD-10-CM | POA: Diagnosis not present

## 2024-06-21 DIAGNOSIS — I2510 Atherosclerotic heart disease of native coronary artery without angina pectoris: Secondary | ICD-10-CM | POA: Diagnosis not present

## 2024-06-21 DIAGNOSIS — E119 Type 2 diabetes mellitus without complications: Secondary | ICD-10-CM | POA: Diagnosis not present

## 2024-06-25 DIAGNOSIS — L03012 Cellulitis of left finger: Secondary | ICD-10-CM | POA: Diagnosis not present

## 2024-06-25 DIAGNOSIS — Z4802 Encounter for removal of sutures: Secondary | ICD-10-CM | POA: Diagnosis not present

## 2024-06-25 DIAGNOSIS — S61012D Laceration without foreign body of left thumb without damage to nail, subsequent encounter: Secondary | ICD-10-CM | POA: Diagnosis not present

## 2024-06-26 DIAGNOSIS — I10 Essential (primary) hypertension: Secondary | ICD-10-CM | POA: Diagnosis not present

## 2024-06-26 DIAGNOSIS — Z9889 Other specified postprocedural states: Secondary | ICD-10-CM | POA: Diagnosis not present

## 2024-06-26 DIAGNOSIS — I252 Old myocardial infarction: Secondary | ICD-10-CM | POA: Diagnosis not present

## 2024-06-26 DIAGNOSIS — R0602 Shortness of breath: Secondary | ICD-10-CM | POA: Diagnosis not present

## 2024-06-26 DIAGNOSIS — I2510 Atherosclerotic heart disease of native coronary artery without angina pectoris: Secondary | ICD-10-CM | POA: Diagnosis not present

## 2024-06-26 DIAGNOSIS — J410 Simple chronic bronchitis: Secondary | ICD-10-CM | POA: Diagnosis not present

## 2024-06-26 DIAGNOSIS — Z955 Presence of coronary angioplasty implant and graft: Secondary | ICD-10-CM | POA: Diagnosis not present

## 2024-06-26 DIAGNOSIS — Z951 Presence of aortocoronary bypass graft: Secondary | ICD-10-CM | POA: Diagnosis not present

## 2024-06-26 DIAGNOSIS — I493 Ventricular premature depolarization: Secondary | ICD-10-CM | POA: Diagnosis not present

## 2024-06-28 ENCOUNTER — Ambulatory Visit
Admission: RE | Admit: 2024-06-28 | Discharge: 2024-06-28 | Disposition: A | Discharge status: Home / Self Care | Source: Ambulatory Visit | Attending: Neurosurgery | Admitting: Neurosurgery

## 2024-06-28 DIAGNOSIS — G9389 Other specified disorders of brain: Secondary | ICD-10-CM | POA: Diagnosis not present

## 2024-06-28 DIAGNOSIS — G319 Degenerative disease of nervous system, unspecified: Secondary | ICD-10-CM | POA: Diagnosis not present

## 2024-07-10 ENCOUNTER — Encounter: Payer: Self-pay | Admitting: Neurosurgery

## 2024-07-10 ENCOUNTER — Ambulatory Visit: Admitting: Neurosurgery

## 2024-07-10 ENCOUNTER — Other Ambulatory Visit

## 2024-07-10 VITALS — BP 138/78 | Ht 69.5 in | Wt 200.0 lb

## 2024-07-10 DIAGNOSIS — G9389 Other specified disorders of brain: Secondary | ICD-10-CM | POA: Diagnosis not present

## 2024-07-10 NOTE — Progress Notes (Signed)
 Referring Physician:  Sadie Manna, MD [address] [facility] [place],  [place] [address]  Primary Physician:  Sadie Manna, MD  History of Present Illness: 07/10/2024 He has occasional headaches.  Only has had a mechanical fall.  Overall, he is at his baseline.  04/04/2024 Jason Mclean continues to have some relative weakness in his left leg, but is able to be very active at home.  He has back pain but his left leg pain is not activity limiting currently.  His most recent A1c is 7.6%.  03/15/2023 Jason Mclean has been pursuing physical therapy.  He started approximately 3 weeks ago.  He is able to do everything he would like to do currently.  He started losing weight about 6 to 8 weeks ago.  He is lost 14 pounds.  He has an A1c check coming on Thursday.  His sugars have been pretty good in the morning.  01/13/2023 Jason Mclean is here today with a chief complaint of low back and buttock pain with radiation to the left anterior lateral thigh and and anterior shin and will occasionally go into his foot.  He has been having pain for the past 6 months or so.  He reports sharp and throbbing pain on into his anterior thigh.  Standing and walking make it worse.  He is concerned that his leg will give out at times.  Stairs make it worse.  Nothing is really helped.  He did have an injection which helped during the anesthetic phase but did not persist.  He recently had a change in his diabetes medications.  His last A1c is 8.7% 2 months ago. Bowel/Bladder Dysfunction: none  Conservative measures:  Physical therapy: has not participated in Multimodal medical therapy including regular antiinflammatories: tylenol , gabapentin , tramadol Injections: has received epidural steroid injections 10/19/2022: Left L3-4 and left L4-5 transforaminal ESI (dexamethasone  8 mg)-no relief 05/29/2019: Left L3-4 and left L4-5 transforaminal ESI (complete relief) 04/18/2019: Left L3-4 and left  L4-5 transforaminal ESI (moderate relief x3 days, then sustained mild relief)   Past Surgery: denies  Jason Mclean. has no symptoms of cervical myelopathy.  The symptoms are causing a significant impact on the patient's life.   I have utilized the care everywhere function in epic to review the outside records available from external health systems.  Review of Systems:  A 10 point review of systems is negative, except for the pertinent positives and negatives detailed in the HPI.  Past Medical History: Past Medical History:  Diagnosis Date   Coronary artery disease    s/p three coronary stents   Diabetes mellitus without complication (HCC)    Hypercholesterolemia    Hypertension    Hypothyroidism    Testicular cancer [facility])     Past Surgical History: Past Surgical History:  Procedure Laterality Date   CATARACT EXTRACTION W/PHACO Left 01/27/2022   Procedure: CATARACT EXTRACTION PHACO AND INTRAOCULAR LENS PLACEMENT (IOC) LEFT DIABETIC 6.17 01:17.1;  Surgeon: Mittie Gaskin, MD;  Location: [facility];  Service: Ophthalmology;  Laterality: Left;  Diabetic   CATARACT EXTRACTION W/PHACO Right 02/10/2022   Procedure: CATARACT EXTRACTION PHACO AND INTRAOCULAR LENS PLACEMENT (IOC) RIGHT DIABETIC 10.48 01:19.8;  Surgeon: Mittie Gaskin, MD;  Location: [facility];  Service: Ophthalmology;  Laterality: Right;  Diabetic   CORONARY ANGIOPLASTY WITH STENT PLACEMENT     CORONARY BALLOON ANGIOPLASTY N/A 01/29/2020   Procedure: CORONARY STENT INTERVENTION;  Surgeon: Ammon Blunt, MD;  Location: [facility] INVASIVE CV LAB;  Service: Cardiovascular;  Laterality: N/A;   CORONARY STENT INTERVENTION N/A 06/16/2018   Procedure: CORONARY STENT INTERVENTION;  Surgeon: Ammon Blunt, MD;  Location: [facility] INVASIVE CV LAB;  Service: Cardiovascular;  Laterality: N/A;   LEFT HEART CATH AND CORONARY ANGIOGRAPHY N/A 06/16/2018   Procedure: LEFT HEART CATH AND CORONARY  ANGIOGRAPHY;  Surgeon: Ammon Blunt, MD;  Location: [facility] INVAS[facility] CV LAB;  Service: Cardiovascular;  Laterality: N/A;   LEFT HEART CATH AND CORONARY ANGIOGRAPHY Left 01/29/2020   Procedure: LEFT HEART CATH AND CORONARY ANGIOGRAPHY;  Surgeon: Ammon Blunt, MD;  Location: [facility] INVASIVE CV LAB;  Service: Cardiovascular;  Laterality: Left;   LEFT HEART CATH AND CORONARY ANGIOGRAPHY Left 02/29/2024   Procedure: LEFT HEART CATH AND CORONARY ANGIOGRAPHY;  Surgeon: Ammon Blunt, MD;  Location: [facility] INVASIVE CV LAB;  Service: Cardiovascular;  Laterality: Left;    Allergies: Allergies as of 07/10/2024   (No Known Allergies)    Medications: Current Meds  Medication Sig   aspirin  EC 81 MG tablet Take 81 mg by mouth daily.    atorvastatin  (LIPITOR ) 80 MG tablet Take 1 tablet (80 mg total) by mouth daily at 6 PM.   Coenzyme Q10 100 MG TABS Take 100 mg by mouth every other day.    ezetimibe  (ZETIA ) 10 MG tablet Take 1 tablet (10 mg total) by mouth daily.   glimepiride  (AMARYL ) 4 MG tablet Take 4 mg by mouth daily with breakfast.   levothyroxine  (SYNTHROID , LEVOTHROID) 75 MCG tablet Take 75 mcg by mouth daily before breakfast.    metFORMIN  (GLUCOPHAGE ) 1000 MG tablet Take 1,000 mg by mouth at bedtime.   Metoprolol  Tartrate 37.5 MG TABS Take 37.5 mg by mouth every 12 (twelve) hours.   Multiple Vitamin (MULTIVITAMIN WITH MINERALS) TABS tablet Take 1 tablet by mouth daily. Centrum   pantoprazole  (PROTONIX ) 40 MG tablet Take 40 mg by mouth daily.   RYBELSUS 14 MG TABS Take 1 tablet by mouth daily.   spironolactone (ALDACTONE) 25 MG tablet Take 12.5 mg by mouth daily.   TURMERIC PO Take 1 capsule by mouth daily.   VITAMIN D PO Take 1 capsule by mouth at bedtime.    Social History: Social History   Tobacco Use   Smoking status: Former    Current packs/day: 0.00    Types: Cigarettes    Quit date: 2014    Years since quitting: 11.6   Smokeless tobacco: Never  Vaping Use   Vaping  status: Never Used  Substance Use Topics   Alcohol  use: Not Currently   Drug use: Never    Family Medical History: Family History  Problem Relation Age of Onset   Strabismus Mother    Stroke Mother    CAD Father     Physical Examination: Vitals:   07/10/24 0930  BP: 138/78     General: Patient is well developed, well nourished, calm, collected, and in no apparent distress. Attention to examination is appropriate.  Neck:   Supple.  Full range of motion.  Respiratory: Patient is breathing without any difficulty.   NEUROLOGICAL:     Awake, alert, oriented to person, place, and time.  Speech is clear and fluent.   Cranial Nerves: Pupils equal round and reactive to light.  Facial tone is symmetric.  Facial sensation is symmetric. Shoulder shrug is symmetric. Tongue protrusion is midline.  There is no pronator drift.  ROM of spine: full.    Strength: 5 out of 5 throughout   Gait is normal.  Medical Decision Making  Imaging: MRI L spine 12/24/22 Alignment:  Normal.   Vertebrae: Modic type 1 degenerative endplate marrow signal changes at L1-2 and L2-3.   Conus medullaris and cauda equina: Conus extends to the T12-L1 level. Conus and cauda equina appear normal.   Paraspinal and other soft tissues: Unremarkable.   Disc levels:   T12-L1:  Normal.   L1-L2: Small disc bulge and mild bilateral facet arthropathy. No spinal canal stenosis or neural foraminal narrowing.   L2-L3: Disc bulge and facet arthropathy results in mild spinal canal stenosis and mild bilateral neural foraminal narrowing.   L3-L4: Left eccentric disc bulge and facet arthropathy results in mild spinal canal stenosis, severe left and moderate right neural foraminal narrowing.   L4-L5: Right eccentric disc bulge and facet arthropathy results in mild left and moderate right neural foraminal narrowing. No significant spinal canal stenosis.   L5-S1: No disc herniation, spinal canal stenosis  or neural foraminal narrowing. Mild bilateral facet arthropathy.   IMPRESSION: 1. Multilevel lumbar spondylosis, worst at L3-L4 where there is mild spinal canal stenosis, severe left and moderate right neural foraminal narrowing. 2. Mild spinal canal stenosis at L2-3. 3. Moderate right neural foraminal narrowing at L4-5.     Electronically Signed   By: Ryan Chess M.D.   On: 12/24/2022 13:11  I have personally reviewed the images and agree with the above interpretation.  Assessment and Plan: Mr. Hargadon is a pleasant 78 y.o. male with subdural fluid collection which appears to be marginally larger on my review.  He is still relatively asymptomatic.  I would like to see him back in a month with a CT scan.  If his fluid collection is worsening at that time, we will discuss surgical intervention.   I spent a total of 10 minutes in this patient's care today. This time was spent reviewing pertinent records including imaging studies, obtaining and confirming history, performing a directed evaluation, formulating and discussing my recommendations, and documenting the visit within the medical record.    Thank you for involving me in the care of this patient.      Derrika Ruffalo K. Clois MD, [facility] Neurosurgery

## 2024-07-16 DIAGNOSIS — I493 Ventricular premature depolarization: Secondary | ICD-10-CM | POA: Diagnosis not present

## 2024-07-19 DIAGNOSIS — Z951 Presence of aortocoronary bypass graft: Secondary | ICD-10-CM | POA: Diagnosis not present

## 2024-07-19 DIAGNOSIS — Z955 Presence of coronary angioplasty implant and graft: Secondary | ICD-10-CM | POA: Diagnosis not present

## 2024-07-19 DIAGNOSIS — Z9889 Other specified postprocedural states: Secondary | ICD-10-CM | POA: Diagnosis not present

## 2024-07-19 DIAGNOSIS — I10 Essential (primary) hypertension: Secondary | ICD-10-CM | POA: Diagnosis not present

## 2024-07-19 DIAGNOSIS — I493 Ventricular premature depolarization: Secondary | ICD-10-CM | POA: Diagnosis not present

## 2024-07-19 DIAGNOSIS — I9789 Other postprocedural complications and disorders of the circulatory system, not elsewhere classified: Secondary | ICD-10-CM | POA: Diagnosis not present

## 2024-07-19 DIAGNOSIS — J410 Simple chronic bronchitis: Secondary | ICD-10-CM | POA: Diagnosis not present

## 2024-07-19 DIAGNOSIS — I2510 Atherosclerotic heart disease of native coronary artery without angina pectoris: Secondary | ICD-10-CM | POA: Diagnosis not present

## 2024-07-19 DIAGNOSIS — I252 Old myocardial infarction: Secondary | ICD-10-CM | POA: Diagnosis not present

## 2024-07-24 DIAGNOSIS — E039 Hypothyroidism, unspecified: Secondary | ICD-10-CM | POA: Diagnosis not present

## 2024-07-24 DIAGNOSIS — E1165 Type 2 diabetes mellitus with hyperglycemia: Secondary | ICD-10-CM | POA: Diagnosis not present

## 2024-07-24 DIAGNOSIS — R0602 Shortness of breath: Secondary | ICD-10-CM | POA: Diagnosis not present

## 2024-07-24 DIAGNOSIS — I2510 Atherosclerotic heart disease of native coronary artery without angina pectoris: Secondary | ICD-10-CM | POA: Diagnosis not present

## 2024-07-24 DIAGNOSIS — D649 Anemia, unspecified: Secondary | ICD-10-CM | POA: Diagnosis not present

## 2024-07-24 DIAGNOSIS — Z6829 Body mass index (BMI) 29.0-29.9, adult: Secondary | ICD-10-CM | POA: Diagnosis not present

## 2024-07-24 DIAGNOSIS — Z951 Presence of aortocoronary bypass graft: Secondary | ICD-10-CM | POA: Diagnosis not present

## 2024-07-31 DIAGNOSIS — Z87891 Personal history of nicotine dependence: Secondary | ICD-10-CM | POA: Diagnosis not present

## 2024-07-31 DIAGNOSIS — J449 Chronic obstructive pulmonary disease, unspecified: Secondary | ICD-10-CM | POA: Diagnosis not present

## 2024-07-31 DIAGNOSIS — S065XAD Traumatic subdural hemorrhage with loss of consciousness status unknown, subsequent encounter: Secondary | ICD-10-CM | POA: Diagnosis not present

## 2024-07-31 DIAGNOSIS — Z87898 Personal history of other specified conditions: Secondary | ICD-10-CM | POA: Diagnosis not present

## 2024-07-31 DIAGNOSIS — Z955 Presence of coronary angioplasty implant and graft: Secondary | ICD-10-CM | POA: Diagnosis not present

## 2024-07-31 DIAGNOSIS — D649 Anemia, unspecified: Secondary | ICD-10-CM | POA: Diagnosis not present

## 2024-07-31 DIAGNOSIS — E119 Type 2 diabetes mellitus without complications: Secondary | ICD-10-CM | POA: Diagnosis not present

## 2024-07-31 DIAGNOSIS — Z951 Presence of aortocoronary bypass graft: Secondary | ICD-10-CM | POA: Diagnosis not present

## 2024-07-31 DIAGNOSIS — E039 Hypothyroidism, unspecified: Secondary | ICD-10-CM | POA: Diagnosis not present

## 2024-08-10 ENCOUNTER — Ambulatory Visit
Admission: RE | Admit: 2024-08-10 | Discharge: 2024-08-10 | Disposition: A | Discharge status: Home / Self Care | Source: Ambulatory Visit | Attending: Neurosurgery | Admitting: Neurosurgery

## 2024-08-10 DIAGNOSIS — S0990XA Unspecified injury of head, initial encounter: Secondary | ICD-10-CM | POA: Diagnosis not present

## 2024-08-10 DIAGNOSIS — G9389 Other specified disorders of brain: Secondary | ICD-10-CM | POA: Insufficient documentation

## 2024-08-15 ENCOUNTER — Ambulatory Visit: Payer: Self-pay | Admitting: Family Medicine

## 2024-08-15 NOTE — Telephone Encounter (Signed)
-----   Message from [facility] sent at 08/15/2024 12:44 PM EDT ----- Please let him know his scan is stable.  No need for future scans unless he has worsening headaches, weakness, or other concerns.    ----- Message ----- From: Interface, Rad Results In Sent: 08/12/2024   5:10 PM EDT To: Reeves Daisy, MD

## 2024-08-15 NOTE — Telephone Encounter (Signed)
 Unable to leave message, no voicemail.

## 2024-09-07 DIAGNOSIS — H40003 Preglaucoma, unspecified, bilateral: Secondary | ICD-10-CM | POA: Diagnosis not present

## 2024-09-07 DIAGNOSIS — H26491 Other secondary cataract, right eye: Secondary | ICD-10-CM | POA: Diagnosis not present

## 2024-09-07 DIAGNOSIS — H26493 Other secondary cataract, bilateral: Secondary | ICD-10-CM | POA: Diagnosis not present

## 2024-09-07 DIAGNOSIS — H26492 Other secondary cataract, left eye: Secondary | ICD-10-CM | POA: Diagnosis not present

## 2024-09-07 DIAGNOSIS — E119 Type 2 diabetes mellitus without complications: Secondary | ICD-10-CM | POA: Diagnosis not present

## 2024-09-18 DIAGNOSIS — H26492 Other secondary cataract, left eye: Secondary | ICD-10-CM | POA: Diagnosis not present

## 2024-10-02 ENCOUNTER — Other Ambulatory Visit: Payer: Self-pay

## 2024-10-02 ENCOUNTER — Telehealth: Payer: Self-pay

## 2024-10-02 DIAGNOSIS — G9389 Other specified disorders of brain: Secondary | ICD-10-CM

## 2024-10-02 NOTE — Telephone Encounter (Signed)
 Patient has been notified that the Head CT was ordered and that someone would be calling him. He was also given the number to scheduling.   I also notified him that if things continue to get notable worse that he should proceed to the ER. He verbalized understanding on all.   I have also sent a message to scheduling.

## 2024-10-02 NOTE — Telephone Encounter (Signed)
 Patient called today complaining of worsening headaches, difficulty with forming his words and with his memory and dragging his right leg. This has worsened over the last 2-3 weeks.  I spoke with Dr. Clois and notified him of the symptoms, he told me to order a Head CT for the patient.  Head CT has been ordered.

## 2024-10-02 NOTE — Progress Notes (Signed)
Head CT has been ordered.

## 2024-10-05 ENCOUNTER — Other Ambulatory Visit: Payer: Self-pay

## 2024-10-05 ENCOUNTER — Encounter: Payer: Self-pay | Admitting: Emergency Medicine

## 2024-10-05 ENCOUNTER — Inpatient Hospital Stay
Admission: EM | Admit: 2024-10-05 | Discharge: 2024-10-08 | DRG: 025 | Disposition: A | Discharge status: Home / Self Care | Attending: Osteopathic Medicine | Admitting: Osteopathic Medicine

## 2024-10-05 ENCOUNTER — Telehealth: Payer: Self-pay | Admitting: Neurosurgery

## 2024-10-05 ENCOUNTER — Ambulatory Visit
Admission: RE | Admit: 2024-10-05 | Discharge: 2024-10-05 | Disposition: A | Discharge status: Home / Self Care | Source: Ambulatory Visit | Attending: Neurosurgery | Admitting: Neurosurgery

## 2024-10-05 DIAGNOSIS — Z8249 Family history of ischemic heart disease and other diseases of the circulatory system: Secondary | ICD-10-CM | POA: Diagnosis not present

## 2024-10-05 DIAGNOSIS — Z823 Family history of stroke: Secondary | ICD-10-CM

## 2024-10-05 DIAGNOSIS — Z79890 Hormone replacement therapy (postmenopausal): Secondary | ICD-10-CM | POA: Diagnosis not present

## 2024-10-05 DIAGNOSIS — I6201 Nontraumatic acute subdural hemorrhage: Secondary | ICD-10-CM | POA: Diagnosis present

## 2024-10-05 DIAGNOSIS — G9389 Other specified disorders of brain: Secondary | ICD-10-CM | POA: Diagnosis not present

## 2024-10-05 DIAGNOSIS — I10 Essential (primary) hypertension: Secondary | ICD-10-CM | POA: Diagnosis present

## 2024-10-05 DIAGNOSIS — E039 Hypothyroidism, unspecified: Secondary | ICD-10-CM | POA: Diagnosis present

## 2024-10-05 DIAGNOSIS — Z955 Presence of coronary angioplasty implant and graft: Secondary | ICD-10-CM

## 2024-10-05 DIAGNOSIS — Z6829 Body mass index (BMI) 29.0-29.9, adult: Secondary | ICD-10-CM | POA: Diagnosis not present

## 2024-10-05 DIAGNOSIS — E7800 Pure hypercholesterolemia, unspecified: Secondary | ICD-10-CM | POA: Diagnosis present

## 2024-10-05 DIAGNOSIS — S0093XA Contusion of unspecified part of head, initial encounter: Secondary | ICD-10-CM | POA: Diagnosis not present

## 2024-10-05 DIAGNOSIS — R6884 Jaw pain: Secondary | ICD-10-CM | POA: Diagnosis not present

## 2024-10-05 DIAGNOSIS — D181 Lymphangioma, any site: Secondary | ICD-10-CM | POA: Diagnosis present

## 2024-10-05 DIAGNOSIS — G8191 Hemiplegia, unspecified affecting right dominant side: Secondary | ICD-10-CM | POA: Diagnosis present

## 2024-10-05 DIAGNOSIS — S06A0XA Traumatic brain compression without herniation, initial encounter: Secondary | ICD-10-CM | POA: Diagnosis present

## 2024-10-05 DIAGNOSIS — Z7982 Long term (current) use of aspirin: Secondary | ICD-10-CM

## 2024-10-05 DIAGNOSIS — R4781 Slurred speech: Secondary | ICD-10-CM | POA: Diagnosis present

## 2024-10-05 DIAGNOSIS — E663 Overweight: Secondary | ICD-10-CM | POA: Diagnosis present

## 2024-10-05 DIAGNOSIS — I2510 Atherosclerotic heart disease of native coronary artery without angina pectoris: Secondary | ICD-10-CM | POA: Diagnosis not present

## 2024-10-05 DIAGNOSIS — Z8547 Personal history of malignant neoplasm of testis: Secondary | ICD-10-CM

## 2024-10-05 DIAGNOSIS — E1165 Type 2 diabetes mellitus with hyperglycemia: Secondary | ICD-10-CM

## 2024-10-05 DIAGNOSIS — R29810 Facial weakness: Secondary | ICD-10-CM | POA: Diagnosis present

## 2024-10-05 DIAGNOSIS — I6203 Nontraumatic chronic subdural hemorrhage: Secondary | ICD-10-CM | POA: Diagnosis not present

## 2024-10-05 DIAGNOSIS — E119 Type 2 diabetes mellitus without complications: Secondary | ICD-10-CM | POA: Diagnosis not present

## 2024-10-05 DIAGNOSIS — Z7984 Long term (current) use of oral hypoglycemic drugs: Secondary | ICD-10-CM

## 2024-10-05 DIAGNOSIS — K219 Gastro-esophageal reflux disease without esophagitis: Secondary | ICD-10-CM | POA: Diagnosis not present

## 2024-10-05 DIAGNOSIS — S065XAA Traumatic subdural hemorrhage with loss of consciousness status unknown, initial encounter: Secondary | ICD-10-CM

## 2024-10-05 DIAGNOSIS — I619 Nontraumatic intracerebral hemorrhage, unspecified: Secondary | ICD-10-CM | POA: Diagnosis not present

## 2024-10-05 DIAGNOSIS — S065X0A Traumatic subdural hemorrhage without loss of consciousness, initial encounter: Secondary | ICD-10-CM | POA: Diagnosis not present

## 2024-10-05 DIAGNOSIS — W2201XA Walked into wall, initial encounter: Secondary | ICD-10-CM | POA: Diagnosis present

## 2024-10-05 DIAGNOSIS — Z79899 Other long term (current) drug therapy: Secondary | ICD-10-CM | POA: Diagnosis not present

## 2024-10-05 DIAGNOSIS — Z87891 Personal history of nicotine dependence: Secondary | ICD-10-CM

## 2024-10-05 DIAGNOSIS — R531 Weakness: Principal | ICD-10-CM

## 2024-10-05 DIAGNOSIS — I6782 Cerebral ischemia: Secondary | ICD-10-CM | POA: Diagnosis not present

## 2024-10-05 DIAGNOSIS — I6200 Nontraumatic subdural hemorrhage, unspecified: Secondary | ICD-10-CM | POA: Diagnosis not present

## 2024-10-05 LAB — POTASSIUM: Potassium: 4.3 mmol/L (ref 3.5–5.1)

## 2024-10-05 LAB — URINALYSIS, ROUTINE W REFLEX MICROSCOPIC
Bilirubin Urine: NEGATIVE
Glucose, UA: NEGATIVE mg/dL
Hgb urine dipstick: NEGATIVE
Ketones, ur: NEGATIVE mg/dL
Leukocytes,Ua: NEGATIVE
Nitrite: NEGATIVE
Protein, ur: NEGATIVE mg/dL
Specific Gravity, Urine: 1.019 (ref 1.005–1.030)
pH: 6 (ref 5.0–8.0)

## 2024-10-05 LAB — CBG MONITORING, ED: Glucose-Capillary: 114 mg/dL — ABNORMAL HIGH (ref 70–99)

## 2024-10-05 LAB — PROTIME-INR
INR: 0.9 (ref 0.8–1.2)
Prothrombin Time: 13.1 s (ref 11.4–15.2)

## 2024-10-05 LAB — CBC WITH DIFFERENTIAL/PLATELET
Abs Immature Granulocytes: 0.02 K/uL (ref 0.00–0.07)
Basophils Absolute: 0.1 K/uL (ref 0.0–0.1)
Basophils Relative: 1 %
Eosinophils Absolute: 0.3 K/uL (ref 0.0–0.5)
Eosinophils Relative: 5 %
HCT: 37.4 % — ABNORMAL LOW (ref 39.0–52.0)
Hemoglobin: 12.3 g/dL — ABNORMAL LOW (ref 13.0–17.0)
Immature Granulocytes: 0 %
Lymphocytes Relative: 29 %
Lymphs Abs: 1.7 K/uL (ref 0.7–4.0)
MCH: 29.6 pg (ref 26.0–34.0)
MCHC: 32.9 g/dL (ref 30.0–36.0)
MCV: 90.1 fL (ref 80.0–100.0)
Monocytes Absolute: 0.6 K/uL (ref 0.1–1.0)
Monocytes Relative: 11 %
Neutro Abs: 3.1 K/uL (ref 1.7–7.7)
Neutrophils Relative %: 54 %
Platelets: 175 K/uL (ref 150–400)
RBC: 4.15 MIL/uL — ABNORMAL LOW (ref 4.22–5.81)
RDW: 14.4 % (ref 11.5–15.5)
WBC: 5.7 K/uL (ref 4.0–10.5)
nRBC: 0 % (ref 0.0–0.2)

## 2024-10-05 LAB — MAGNESIUM: Magnesium: 1.8 mg/dL (ref 1.7–2.4)

## 2024-10-05 LAB — COMPREHENSIVE METABOLIC PANEL WITH GFR
ALT: 12 U/L (ref 0–44)
AST: 24 U/L (ref 15–41)
Albumin: 3.9 g/dL (ref 3.5–5.0)
Alkaline Phosphatase: 73 U/L (ref 38–126)
Anion gap: 9 (ref 5–15)
BUN: 19 mg/dL (ref 8–23)
CO2: 25 mmol/L (ref 22–32)
Calcium: 9.2 mg/dL (ref 8.9–10.3)
Chloride: 104 mmol/L (ref 98–111)
Creatinine, Ser: 0.94 mg/dL (ref 0.61–1.24)
GFR, Estimated: >60 mL/min (ref >60)
Glucose, Bld: 122 mg/dL — ABNORMAL HIGH (ref 70–99)
Potassium: 4 mmol/L (ref 3.5–5.1)
Sodium: 139 mmol/L (ref 135–145)
Total Bilirubin: 0.7 mg/dL (ref 0.0–1.2)
Total Protein: 6.8 g/dL (ref 6.5–8.1)

## 2024-10-05 LAB — GLUCOSE, CAPILLARY
Glucose-Capillary: 111 mg/dL — ABNORMAL HIGH (ref 70–99)
Glucose-Capillary: 165 mg/dL — ABNORMAL HIGH (ref 70–99)
Glucose-Capillary: 125 mg/dL — ABNORMAL HIGH (ref 70–99)

## 2024-10-05 LAB — HEMOGLOBIN A1C
Hgb A1c MFr Bld: 7.1 % — ABNORMAL HIGH (ref 4.8–5.6)
Mean Plasma Glucose: 157.07 mg/dL

## 2024-10-05 LAB — MRSA NEXT GEN BY PCR, NASAL: MRSA by PCR Next Gen: NOT DETECTED

## 2024-10-05 LAB — ETHANOL: Alcohol, Ethyl (B): <15 mg/dL (ref <15)

## 2024-10-05 LAB — APTT: aPTT: 29 s (ref 24–36)

## 2024-10-05 MED ORDER — ALBUTEROL SULFATE (2.5 MG/3ML) 0.083% IN NEBU: 2.5000 mg | INHALATION_SOLUTION | RESPIRATORY_TRACT | Status: DC | PRN

## 2024-10-05 MED ORDER — ONDANSETRON HCL 4 MG/2ML IJ SOLN
4.0000 mg | Freq: Four times a day (QID) | INTRAMUSCULAR | Status: DC | PRN
Start: 2024-10-05 — End: 2024-10-08

## 2024-10-05 MED ORDER — EZETIMIBE 10 MG PO TABS
10.0000 mg | ORAL_TABLET | Freq: Every day | ORAL | Status: DC
  Administered 2024-10-05 – 2024-10-08 (×4): 10 mg via ORAL
  Filled 2024-10-05 (×4): qty 1

## 2024-10-05 MED ORDER — CHLORHEXIDINE GLUCONATE CLOTH 2 % EX PADS
6.0000 | MEDICATED_PAD | Freq: Every day | CUTANEOUS | Status: DC
  Administered 2024-10-05 – 2024-10-08 (×4): 6 via TOPICAL

## 2024-10-05 MED ORDER — SODIUM CHLORIDE 0.9 % IV SOLN: INTRAVENOUS | Status: AC

## 2024-10-05 MED ORDER — ACETAMINOPHEN 650 MG RE SUPP: 650.0000 mg | Freq: Four times a day (QID) | RECTAL | Status: DC | PRN

## 2024-10-05 MED ORDER — METOPROLOL TARTRATE 25 MG PO TABS
37.5000 mg | ORAL_TABLET | Freq: Two times a day (BID) | ORAL | Status: DC
  Administered 2024-10-05 – 2024-10-08 (×7): 37.5 mg via ORAL
  Filled 2024-10-05 (×7): qty 2

## 2024-10-05 MED ORDER — TRAZODONE HCL 50 MG PO TABS
25.0000 mg | ORAL_TABLET | Freq: Every evening | ORAL | Status: DC | PRN
  Administered 2024-10-07: 25 mg via ORAL
  Filled 2024-10-05: qty 1

## 2024-10-05 MED ORDER — HYDRALAZINE HCL 20 MG/ML IJ SOLN
5.0000 mg | Freq: Four times a day (QID) | INTRAMUSCULAR | Status: DC | PRN
  Administered 2024-10-05: 5 mg via INTRAVENOUS
  Filled 2024-10-05: qty 1

## 2024-10-05 MED ORDER — HYDRALAZINE HCL 20 MG/ML IJ SOLN
10.0000 mg | Freq: Four times a day (QID) | INTRAMUSCULAR | Status: DC | PRN
  Administered 2024-10-05 – 2024-10-07 (×4): 10 mg via INTRAVENOUS
  Filled 2024-10-05 (×4): qty 1

## 2024-10-05 MED ORDER — ONDANSETRON HCL 4 MG PO TABS: 4.0000 mg | ORAL_TABLET | Freq: Four times a day (QID) | ORAL | Status: DC | PRN

## 2024-10-05 MED ORDER — HYDRALAZINE HCL 20 MG/ML IJ SOLN
5.0000 mg | Freq: Once | INTRAMUSCULAR | Status: AC
  Administered 2024-10-05: 5 mg via INTRAVENOUS
  Filled 2024-10-05: qty 1

## 2024-10-05 MED ORDER — PANTOPRAZOLE SODIUM 40 MG PO TBEC
40.0000 mg | DELAYED_RELEASE_TABLET | Freq: Every day | ORAL | Status: DC
  Administered 2024-10-05 – 2024-10-08 (×4): 40 mg via ORAL
  Filled 2024-10-05 (×4): qty 1

## 2024-10-05 MED ORDER — INSULIN ASPART 100 UNIT/ML IJ SOLN
0.0000 [IU] | Freq: Every day | INTRAMUSCULAR | Status: DC
  Administered 2024-10-06: 2 [IU] via SUBCUTANEOUS
  Administered 2024-10-07: 3 [IU] via SUBCUTANEOUS
  Filled 2024-10-05: qty 2
  Filled 2024-10-05: qty 3

## 2024-10-05 MED ORDER — INSULIN ASPART 100 UNIT/ML IJ SOLN
0.0000 [IU] | Freq: Three times a day (TID) | INTRAMUSCULAR | Status: DC
  Administered 2024-10-05: 3 [IU] via SUBCUTANEOUS
  Administered 2024-10-06: 5 [IU] via SUBCUTANEOUS
  Administered 2024-10-06: 3 [IU] via SUBCUTANEOUS
  Administered 2024-10-06: 8 [IU] via SUBCUTANEOUS
  Administered 2024-10-07: 3 [IU] via SUBCUTANEOUS
  Administered 2024-10-07: 2 [IU] via SUBCUTANEOUS
  Administered 2024-10-07 – 2024-10-08 (×3): 3 [IU] via SUBCUTANEOUS
  Filled 2024-10-05: qty 8
  Filled 2024-10-05 (×4): qty 3
  Filled 2024-10-05: qty 2
  Filled 2024-10-05: qty 3
  Filled 2024-10-05: qty 5
  Filled 2024-10-05: qty 3

## 2024-10-05 MED ORDER — ACETAMINOPHEN 325 MG PO TABS
650.0000 mg | ORAL_TABLET | Freq: Four times a day (QID) | ORAL | Status: DC | PRN
  Administered 2024-10-06 – 2024-10-07 (×2): 650 mg via ORAL
  Filled 2024-10-05 (×2): qty 2

## 2024-10-05 NOTE — ED Notes (Signed)
 Called CCMD to place pt on central monitoring
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
## 2024-10-05 NOTE — Telephone Encounter (Signed)
 Order placed for CT head without contrast to be done around 11/02/24-11/06/24 (pt has follow up appointment 11/09/24).

## 2024-10-05 NOTE — Telephone Encounter (Signed)
 Gordy from [facility] Radiology wanted to provide a urgent result for the CT:  IMPRESSION: 1. Significant interval enlargement of acute on chronic left cerebral convexity subdural hematoma, now up to 2.8 cm. 2. Associated marked interval increase in mass effect with left hemispheric sulcal effacement and 9 mm rightward midline shift. 3. Chronic microvascular ischemic changes, similar to prior.

## 2024-10-05 NOTE — Progress Notes (Signed)
 Preliminary neurosurgery recommendations.  Patient with a known chronic subdural hematoma with significant expansion.  Will require surgical evacuation not emergently.  Discussed with the operating room, have acquired a 745 start for tomorrow morning.  Please make n.p.o. after midnight Hold aspirin  or any anticoagulants Continue maintenance fluids overnight Obtain PT PTT and type and screen preoperatively  Full formal consultation to follow

## 2024-10-05 NOTE — H&P (Signed)
 History and Physical  Jason Mclean. FMW:[id number] DOB: 1946-07-07 DOA: 10/05/2024  PCP: Sadie Manna, MD   Chief Complaint: Abnormal head CT  HPI: Jason JAYSON Sherlynn Mclean. is a 78 y.o. male with medical history significant for hypertension, hyperlipidemia, hypothyroidism, non-insulin -dependent type 2 diabetes and a known chronic subdural hematoma who is being admitted to the hospital with significant expansion.  Over the last week or so, his fiance has noticed that he is a little bit distractible, seems intermittently slightly confused.  He had bilateral lower extremity weakness as well.  They deny any recent falls, fevers, chills, nausea, vomiting or any other concerns.  He takes aspirin  81 mg daily, last dose was yesterday.  He had an outpatient CT scan done by neurosurgery Dr. Clois, findings showed significant expansion of his chronic subdural and he presented to the emergency department for evaluation.  Review of Systems: Please see HPI for pertinent positives and negatives. A complete 10 system review of systems are otherwise negative.  Past Medical History:  Diagnosis Date   Coronary artery disease    s/p three coronary stents   Diabetes mellitus without complication (HCC)    Hypercholesterolemia    Hypertension    Hypothyroidism    Testicular cancer [facility])    Past Surgical History:  Procedure Laterality Date   CATARACT EXTRACTION W/PHACO Left 01/27/2022   Procedure: CATARACT EXTRACTION PHACO AND INTRAOCULAR LENS PLACEMENT (IOC) LEFT DIABETIC 6.17 01:17.1;  Surgeon: Mittie Gaskin, MD;  Location: [facility];  Service: Ophthalmology;  Laterality: Left;  Diabetic   CATARACT EXTRACTION W/PHACO Right 02/10/2022   Procedure: CATARACT EXTRACTION PHACO AND INTRAOCULAR LENS PLACEMENT (IOC) RIGHT DIABETIC 10.48 01:19.8;  Surgeon: Mittie Gaskin, MD;  Location: [facility] SUR[facility]ERY [facility];  Service: Ophthalmology;  Laterality: Right;  Diabetic   CORONARY ANGIOPLASTY WITH  STENT PLACEMENT     CORONARY BALLOON ANGIOPLASTY N/A 01/29/2020   Procedure: CORONARY STENT INTERVENTION;  Surgeon: Ammon Blunt, MD;  Location: [facility] INVASIVE CV LAB;  Service: Cardiovascular;  Laterality: N/A;   CORONARY STENT INTERVENTION N/A 06/16/2018   Procedure: CORONARY STENT INTERVENTION;  Surgeon: Ammon Blunt, MD;  Location: [facility] INVASIVE CV LAB;  Service: Cardiovascular;  Laterality: N/A;   LEFT HEART CATH AND CORONARY ANGIOGRAPHY N/A 06/16/2018   Procedure: LEFT HEART CATH AND CORONARY ANGIOGRAPHY;  Surgeon: Ammon Blunt, MD;  Location: [facility] INVASIVE CV LAB;  Service: Cardiovascular;  Laterality: N/A;   LEFT HEART CATH AND CORONARY ANGIOGRAPHY Left 01/29/2020   Procedure: LEFT HEART CATH AND CORONARY ANGIOGRAPHY;  Surgeon: Ammon Blunt, MD;  Location: [facility] INVASIVE CV LAB;  Service: Cardiovascular;  Laterality: Left;   LEFT HEART CATH AND CORONARY ANGIOGRAPHY Left 02/29/2024   Procedure: LEFT HEART CATH AND CORONARY ANGIOGRAPHY;  Surgeon: Ammon Blunt, MD;  Location: [facility] INVASIVE CV LAB;  Service: Cardiovascular;  Laterality: Left;   Social History:  reports that he quit smoking about 11 years ago. His smoking use included cigarettes. He has never used smokeless tobacco. He reports that he does not currently use alcohol . He reports that he does not use drugs.  No Known Allergies  Family History  Problem Relation Age of Onset   Strabismus Mother    Stroke Mother    CAD Father      Prior to Admission medications   Medication Sig Start Date End Date Taking? Authorizing Provider  aspirin  EC 81 MG tablet Take 81 mg by mouth daily.     [provider]  atorvastatin  (LIPITOR ) 80 MG tablet Take 1 tablet (  80 mg total) by mouth daily at 6 PM. 06/17/18   Tobie Press, MD  Coenzyme Q10 100 MG TABS Take 100 mg by mouth every other day.     [provider]  ezetimibe  (ZETIA ) 10 MG tablet Take 1 tablet (10 mg total) by mouth daily. 03/04/24    Henry Manuelita NOVAK, NP  glimepiride  (AMARYL ) 4 MG tablet Take 4 mg by mouth daily with breakfast. 12/26/23   [provider]  levothyroxine  (SYNTHROID , LEVOTHROID) 75 MCG tablet Take 75 mcg by mouth daily before breakfast.  03/28/18   [provider]  metFORMIN  (GLUCOPHAGE ) 1000 MG tablet Take 1,000 mg by mouth at bedtime. 01/06/24 01/05/25  [provider]  Metoprolol  Tartrate 37.5 MG TABS Take 37.5 mg by mouth every 12 (twelve) hours.    [provider]  Multiple Vitamin (MULTIVITAMIN WITH MINERALS) TABS tablet Take 1 tablet by mouth daily. Centrum    [provider]  pantoprazole  (PROTONIX ) 40 MG tablet Take 40 mg by mouth daily. 11/24/23 11/23/24  [provider]  RYBELSUS 14 MG TABS Take 1 tablet by mouth daily. 02/09/24   [provider]  spironolactone (ALDACTONE) 25 MG tablet Take 12.5 mg by mouth daily.    [provider]  TURMERIC PO Take 1 capsule by mouth daily.    [provider]  VITAMIN D PO Take 1 capsule by mouth at bedtime.    [provider]    Physical Exam: BP (!) 179/81   Pulse 70   Temp 98.1 F (36.7 C) (Oral)   Resp 16   Ht 5' 9 (1.753 m)   Wt 89.8 kg   SpO2 98%   BMI 29.24 kg/m  General:  Alert, oriented, calm, in no acute distress, resting comfortably on room air.  His fiance is at the bedside.  He looks generally slightly weak. Eyes: EOMI, clear conjuctivae, white sclerea Neck: supple, no masses, trachea mildline  Cardiovascular: RRR, no murmurs or rubs, no peripheral edema  Respiratory: clear to auscultation bilaterally, no wheezes, no crackles  Abdomen: soft, nontender, nondistended, normal bowel tones heard  Skin: dry, no rashes  Musculoskeletal: no joint effusions, normal range of motion  Psychiatric: appropriate affect, normal speech  Neurologic: extraocular muscles intact, clear speech, moving all extremities with intact sensorium         Labs on Admission:  Basic  Metabolic Panel: Recent Labs  Lab 10/05/24 0918  NA 139  K 4.0  CL 104  CO2 25  GLUCOSE 122*  BUN 19  CREATININE 0.94  CALCIUM  9.2   Liver Function Tests: Recent Labs  Lab 10/05/24 0918  AST 24  ALT 12  ALKPHOS 73  BILITOT 0.7  PROT 6.8  ALBUMIN 3.9   No results for input(s): LIPASE, AMYLASE in the last 168 hours. No results for input(s): AMMONIA in the last 168 hours. CBC: Recent Labs  Lab 10/05/24 0918  WBC 5.7  NEUTROABS 3.1  HGB 12.3*  HCT 37.4*  MCV 90.1  PLT 175   Cardiac Enzymes: No results for input(s): CKTOTAL, CKMB, CKMBINDEX, TROPONINI in the last 168 hours. BNP (last 3 results) Recent Labs    03/01/24 0132  BNP 103.8*    ProBNP (last 3 results) No results for input(s): PROBNP in the last 8760 hours.  CBG: Recent Labs  Lab 10/05/24 0917  GLUCAP 114*    Radiological Exams on Admission: CT HEAD WO CONTRAST ( ) Result Date: 10/05/2024 EXAM: CT HEAD WITHOUT CONTRAST 10/05/2024 08:45:36 AM TECHNIQUE:  CT of the head was performed without the administration of intravenous contrast. Automated exposure control, iterative reconstruction, and/or weight based adjustment of the mA/kV was utilized to reduce the radiation dose to as low as reasonably achievable. COMPARISON: 08/10/2024 CLINICAL HISTORY: Subdural fluid collection FINDINGS: BRAIN AND VENTRICLES: Redemonstrated large extra-axial collection along the left cerebral convexity, measures up to 2.8 cm in thickness on coronal images series 4 image 28. This collection previously measured approximately 10 mm in thickness. On oblique axial images, the collection measures up to 2.7 cm in thickness, previously measuring approximately 9 mm. There are layering hyperattenuating blood products posteriorly compatible with acute hemorrhage with additional areas of hypo to isoattenuating blood products anteriorly, findings suggestive of acute on chronic subdural hematoma. There is significant  interval increase in mass effect on the left cerebral hemisphere with sulcal effacement throughout the left cerebral hemisphere. There is 9 mm rightward midline shift at the level of the lateral ventricles, previously 2 mm. There is increased effacement of the left lateral ventricle. No hydrocephalus. No acute hemorrhage. No evidence of parenchymal hemorrhage. No evidence of intraventricular hemorrhage. No evidence of acute infarct. Similar appearance of chronic microvascular ischemic changes. ORBITS: No acute abnormality. SINUSES: Mild mucosal thickening in ethmoid air cells. SOFT TISSUES AND SKULL: No acute soft tissue abnormality. No skull fracture. IMPRESSION: 1. Significant interval enlargement of acute on chronic left cerebral convexity subdural hematoma, now up to 2.8 cm. 2. Associated marked interval increase in mass effect with left hemispheric sulcal effacement and 9 mm rightward midline shift. 3. Chronic microvascular ischemic changes, similar to prior. Electronically signed by: Donnice Mania MD 10/05/2024 09:00 AM EST RP Workstation: [id number]   Assessment/Plan Jason Mclean. is a 78 y.o. male with medical history significant for hypertension, hyperlipidemia, hypothyroidism, non-insulin -dependent type 2 diabetes and a known chronic subdural hematoma who is being admitted to the hospital with significant expansion.  Acute on chronic subdural hematoma-in the setting of known subdural/hygroma -Inpatient admission -N.p.o. after midnight -Hold aspirin  -Monitor on stepdown, with frequent neurochecks -Per Dr. Claudene of neurosurgery, goal SBP would be less than 160  Hypertension-Home metoprolol , with IV hydralazine  for SBP greater than 160  Type 2 diabetes -Carb modified diet -Sliding scale  GERD-PPI  Hyperlipidemia-statin  DVT prophylaxis: SCDs only    Code Status: Full Code  Consults called: Neurosurgery  Admission status: The appropriate patient status for this patient is  INPATIENT. Inpatient status is judged to be reasonable and necessary in order to provide the required intensity of service to ensure the patient's safety. The patient's presenting symptoms, physical exam findings, and initial radiographic and laboratory data in the context of their chronic comorbidities is felt to place them at high risk for further clinical deterioration. Furthermore, it is not anticipated that the patient will be medically stable for discharge from the hospital within 2 midnights of admission.    I certify that at the point of admission it is my clinical judgment that the patient will require inpatient hospital care spanning beyond 2 midnights from the point of admission due to high intensity of service, high risk for further deterioration and high frequency of surveillance required  Time spent: 56 minutes  Athony Coppa CHRISTELLA Gail MD [facility] Pager [phone]  If 7PM-7AM, please contact night-coverage [email or web address] Password [email or web address]  10/05/2024, 10:18 AM

## 2024-10-05 NOTE — ED Triage Notes (Addendum)
 Pt to ED via outpatient CT. Pt had a follow up scan for a subdural hematoma. Pt fell approximately 6 months ago and was found to have small subdural hematoma. Pts significant other states that pt has been having worsening of symptoms over the past few weeks. Pts neurosurgeon ordered outpatient CT that showed significant enlargement of the subdural hematoma and increased mass effect with left hemispheric sulcal effacement and 9 mm midline shift. Pt is alert but confused at this time.

## 2024-10-05 NOTE — Telephone Encounter (Signed)
 Patient is already at ER as of 9:01 am today. Sending to provider for review in case anything else is needed at this time

## 2024-10-05 NOTE — Plan of Care (Signed)

## 2024-10-06 ENCOUNTER — Inpatient Hospital Stay: Payer: Self-pay | Admitting: General Practice

## 2024-10-06 ENCOUNTER — Inpatient Hospital Stay

## 2024-10-06 ENCOUNTER — Encounter
Admission: EM | Disposition: A | Discharge status: Home / Self Care | Payer: Self-pay | Source: Home / Self Care | Attending: Osteopathic Medicine

## 2024-10-06 DIAGNOSIS — I6200 Nontraumatic subdural hemorrhage, unspecified: Secondary | ICD-10-CM | POA: Diagnosis not present

## 2024-10-06 DIAGNOSIS — R531 Weakness: Principal | ICD-10-CM

## 2024-10-06 DIAGNOSIS — S0093XA Contusion of unspecified part of head, initial encounter: Secondary | ICD-10-CM | POA: Diagnosis not present

## 2024-10-06 DIAGNOSIS — I619 Nontraumatic intracerebral hemorrhage, unspecified: Secondary | ICD-10-CM | POA: Diagnosis not present

## 2024-10-06 DIAGNOSIS — I6782 Cerebral ischemia: Secondary | ICD-10-CM | POA: Diagnosis not present

## 2024-10-06 DIAGNOSIS — S065XAA Traumatic subdural hemorrhage with loss of consciousness status unknown, initial encounter: Secondary | ICD-10-CM | POA: Diagnosis not present

## 2024-10-06 DIAGNOSIS — I6201 Nontraumatic acute subdural hemorrhage: Secondary | ICD-10-CM | POA: Diagnosis not present

## 2024-10-06 DIAGNOSIS — I6203 Nontraumatic chronic subdural hemorrhage: Secondary | ICD-10-CM | POA: Diagnosis not present

## 2024-10-06 HISTORY — PX: BURR HOLE: SHX908

## 2024-10-06 LAB — CBC
HCT: 40.3 % (ref 39.0–52.0)
Hemoglobin: 13.4 g/dL (ref 13.0–17.0)
MCH: 29.3 pg (ref 26.0–34.0)
MCHC: 33.3 g/dL (ref 30.0–36.0)
MCV: 88 fL (ref 80.0–100.0)
Platelets: 189 K/uL (ref 150–400)
RBC: 4.58 MIL/uL (ref 4.22–5.81)
RDW: 14.3 % (ref 11.5–15.5)
WBC: 7.5 K/uL (ref 4.0–10.5)
nRBC: 0 % (ref 0.0–0.2)

## 2024-10-06 LAB — GLUCOSE, CAPILLARY
Glucose-Capillary: 160 mg/dL — ABNORMAL HIGH (ref 70–99)
Glucose-Capillary: 186 mg/dL — ABNORMAL HIGH (ref 70–99)
Glucose-Capillary: 222 mg/dL — ABNORMAL HIGH (ref 70–99)
Glucose-Capillary: 283 mg/dL — ABNORMAL HIGH (ref 70–99)
Glucose-Capillary: 220 mg/dL — ABNORMAL HIGH (ref 70–99)

## 2024-10-06 LAB — BASIC METABOLIC PANEL WITH GFR
Anion gap: 13 (ref 5–15)
BUN: 13 mg/dL (ref 8–23)
CO2: 22 mmol/L (ref 22–32)
Calcium: 8.9 mg/dL (ref 8.9–10.3)
Chloride: 104 mmol/L (ref 98–111)
Creatinine, Ser: 0.77 mg/dL (ref 0.61–1.24)
GFR, Estimated: >60 mL/min (ref >60)
Glucose, Bld: 118 mg/dL — ABNORMAL HIGH (ref 70–99)
Potassium: 4 mmol/L (ref 3.5–5.1)
Sodium: 139 mmol/L (ref 135–145)

## 2024-10-06 SURGERY — CREATION, CRANIAL BURR HOLE
Anesthesia: General | Site: Scalp | Laterality: Left

## 2024-10-06 MED ORDER — LIDOCAINE HCL (PF) 2 % IJ SOLN
INTRAMUSCULAR | Status: AC
  Filled 2024-10-06: qty 5

## 2024-10-06 MED ORDER — ROCURONIUM BROMIDE 100 MG/10ML IV SOLN
INTRAVENOUS | Status: DC | PRN
  Administered 2024-10-06: 50 mg via INTRAVENOUS

## 2024-10-06 MED ORDER — SUGAMMADEX SODIUM 200 MG/2ML IV SOLN
INTRAVENOUS | Status: DC | PRN
Start: 2024-10-06 — End: 2024-10-06
  Administered 2024-10-06: 200 mg via INTRAVENOUS

## 2024-10-06 MED ORDER — SODIUM CHLORIDE 0.9 % IV SOLN
INTRAVENOUS | Status: AC | PRN
  Administered 2024-10-06: 1000 mL via INTRAMUSCULAR

## 2024-10-06 MED ORDER — PHENYLEPHRINE 80 MCG/ML (10ML) SYRINGE FOR IV PUSH (FOR BLOOD PRESSURE SUPPORT)
PREFILLED_SYRINGE | INTRAVENOUS | Status: AC
  Filled 2024-10-06: qty 10

## 2024-10-06 MED ORDER — PHENYLEPHRINE HCL-NACL 20-0.9 MG/250ML-% IV SOLN
INTRAVENOUS | Status: AC
  Filled 2024-10-06: qty 250

## 2024-10-06 MED ORDER — LEVOTHYROXINE SODIUM 50 MCG PO TABS
75.0000 ug | ORAL_TABLET | Freq: Every day | ORAL | Status: DC
  Administered 2024-10-07 – 2024-10-08 (×2): 75 ug via ORAL
  Filled 2024-10-06: qty 2
  Filled 2024-10-06: qty 1

## 2024-10-06 MED ORDER — LIDOCAINE HCL (CARDIAC) PF 100 MG/5ML IV SOSY
PREFILLED_SYRINGE | INTRAVENOUS | Status: DC | PRN
  Administered 2024-10-06: 90 mg via INTRAVENOUS

## 2024-10-06 MED ORDER — OXYCODONE HCL 5 MG PO TABS: 5.0000 mg | ORAL_TABLET | Freq: Once | ORAL | Status: DC | PRN

## 2024-10-06 MED ORDER — DEXAMETHASONE SOD PHOSPHATE PF 10 MG/ML IJ SOLN
INTRAMUSCULAR | Status: DC | PRN
  Administered 2024-10-06: 10 mg via INTRAVENOUS

## 2024-10-06 MED ORDER — LABETALOL HCL 5 MG/ML IV SOLN
20.0000 mg | INTRAVENOUS | Status: DC | PRN
  Administered 2024-10-06: 20 mg via INTRAVENOUS
  Filled 2024-10-06: qty 4

## 2024-10-06 MED ORDER — FENTANYL CITRATE (PF) 100 MCG/2ML IJ SOLN
INTRAMUSCULAR | Status: AC
  Filled 2024-10-06: qty 2

## 2024-10-06 MED ORDER — OXYCODONE HCL 5 MG/5ML PO SOLN: 5.0000 mg | Freq: Once | ORAL | Status: DC | PRN

## 2024-10-06 MED ORDER — CEFAZOLIN SODIUM-DEXTROSE 2-4 GM/100ML-% IV SOLN
INTRAVENOUS | Status: AC
  Filled 2024-10-06: qty 100

## 2024-10-06 MED ORDER — FENTANYL CITRATE (PF) 100 MCG/2ML IJ SOLN
INTRAMUSCULAR | Status: DC | PRN
  Administered 2024-10-06 (×2): 50 ug via INTRAVENOUS

## 2024-10-06 MED ORDER — ROCURONIUM BROMIDE 10 MG/ML (PF) SYRINGE
PREFILLED_SYRINGE | INTRAVENOUS | Status: AC
Start: 2024-10-06 — End: 2024-10-06
  Filled 2024-10-06: qty 10

## 2024-10-06 MED ORDER — PHENYLEPHRINE HCL-NACL 20-0.9 MG/250ML-% IV SOLN
INTRAVENOUS | Status: DC | PRN
  Administered 2024-10-06: 30 ug/min via INTRAVENOUS

## 2024-10-06 MED ORDER — PHENYLEPHRINE 80 MCG/ML (10ML) SYRINGE FOR IV PUSH (FOR BLOOD PRESSURE SUPPORT)
PREFILLED_SYRINGE | INTRAVENOUS | Status: DC | PRN
  Administered 2024-10-06 (×3): 80 ug via INTRAVENOUS
  Administered 2024-10-06 (×3): 160 ug via INTRAVENOUS
  Administered 2024-10-06: 80 ug via INTRAVENOUS

## 2024-10-06 MED ORDER — PROPOFOL 10 MG/ML IV BOLUS
INTRAVENOUS | Status: AC
  Filled 2024-10-06: qty 20

## 2024-10-06 MED ORDER — ONDANSETRON HCL 4 MG/2ML IJ SOLN
INTRAMUSCULAR | Status: AC
  Filled 2024-10-06: qty 2

## 2024-10-06 MED ORDER — PROPOFOL 10 MG/ML IV BOLUS
INTRAVENOUS | Status: DC | PRN
Start: 2024-10-06 — End: 2024-10-06
  Administered 2024-10-06: 150 mg via INTRAVENOUS

## 2024-10-06 MED ORDER — FENTANYL CITRATE (PF) 100 MCG/2ML IJ SOLN: 25.0000 ug | INTRAMUSCULAR | Status: DC | PRN

## 2024-10-06 MED ORDER — LIDOCAINE-EPINEPHRINE 1 %-1:100000 IJ SOLN
INTRAMUSCULAR | Status: DC | PRN
  Administered 2024-10-06: 10 mL

## 2024-10-06 MED ORDER — ONDANSETRON HCL 4 MG/2ML IJ SOLN
INTRAMUSCULAR | Status: DC | PRN
  Administered 2024-10-06: 4 mg via INTRAVENOUS

## 2024-10-06 MED ORDER — CEFAZOLIN SODIUM-DEXTROSE 2-3 GM-%(50ML) IV SOLR
INTRAVENOUS | Status: DC | PRN
  Administered 2024-10-06: 2 g via INTRAVENOUS

## 2024-10-06 MED ORDER — EPHEDRINE SULFATE-NACL 50-0.9 MG/10ML-% IV SOSY
PREFILLED_SYRINGE | INTRAVENOUS | Status: DC | PRN
  Administered 2024-10-06: 10 mg via INTRAVENOUS

## 2024-10-06 MED ORDER — LEVETIRACETAM ER 500 MG PO TB24
500.0000 mg | ORAL_TABLET | Freq: Two times a day (BID) | ORAL | Status: DC
  Administered 2024-10-06 – 2024-10-08 (×5): 500 mg via ORAL
  Filled 2024-10-06 (×6): qty 1

## 2024-10-06 SURGICAL SUPPLY — 34 items
BLADE CLIPPER SPEC (BLADE) ×1 IMPLANT
BRUSH SCRUB EZ 4% CHG (MISCELLANEOUS) ×1 IMPLANT
CNTNR URN SCR LID CUP LEK RST (MISCELLANEOUS) ×1 IMPLANT
DRAIN CHANNEL JP 10FR RND 1/8 (MISCELLANEOUS) ×1 IMPLANT
DRSG NON-ADHERENT DERMACEA 3X4 (GAUZE/BANDAGES/DRESSINGS) IMPLANT
DRSG TEGADERM 4X4.75 (GAUZE/BANDAGES/DRESSINGS) IMPLANT
DRSG TELFA 3X4 N-ADH STERILE (GAUZE/BANDAGES/DRESSINGS) ×1 IMPLANT
ELECTRODE REM PT RTRN 9FT ADLT (ELECTROSURGICAL) ×1 IMPLANT
EVACUATOR SILICONE 100CC (DRAIN) ×1 IMPLANT
GAUZE SPONGE 4X4 12PLY STRL (GAUZE/BANDAGES/DRESSINGS) IMPLANT
GLOVE BIOGEL PI IND STRL 8 (GLOVE) ×1 IMPLANT
GLOVE SRG 8 PF TXTR STRL LF DI (GLOVE) ×1 IMPLANT
GLOVE SURG SYN 7.5 PF PI (GLOVE) ×2 IMPLANT
GOWN SRG XL LVL 3 NONREINFORCE (GOWNS) ×1 IMPLANT
HOLDER FOLEY CATH W/STRAP (MISCELLANEOUS) IMPLANT
KIT TURNOVER KIT A (KITS) ×1 IMPLANT
MANIFOLD NEPTUNE II (INSTRUMENTS) ×1 IMPLANT
MAT ABSORB FLUID 56X50 GRAY (MISCELLANEOUS) ×1 IMPLANT
PACK LAMINECTOMY ARMC (PACKS) ×1 IMPLANT
PAD ARMBOARD POSITIONER FOAM (MISCELLANEOUS) ×2 IMPLANT
PERFORATOR LRG 14-11MM (BIT) ×1 IMPLANT
PIN MAYFIELD SKULL DISP (PIN) IMPLANT
PLATE 1.5/0.5 18.5MM BURR HOLE (Plate) IMPLANT
SCREW SELF DRILL HT 1.5/4MM (Screw) IMPLANT
SHEET NEURO XL SOL CTL (MISCELLANEOUS) ×1 IMPLANT
SOLN STERILE WATER BTL 1000 ML (IV SOLUTION) ×4 IMPLANT
SPONGE SURGIFOAM ABS GEL 12-7 (HEMOSTASIS) IMPLANT
STAPLER SKIN PROX 35W (STAPLE) ×2 IMPLANT
SURGIFLO W/THROMBIN 8M KIT (HEMOSTASIS) IMPLANT
SUT ETHILON 3-0 (SUTURE) ×1 IMPLANT
SUT VICRYL 2-0 SH 8X27 (SUTURE) ×2 IMPLANT
SUT VICRYL 3-0 CR8 SH (SUTURE) IMPLANT
TRAP FLUID SMOKE EVACUATOR (MISCELLANEOUS) ×1 IMPLANT
TRAY FOLEY SLVR 16FR LF STAT (SET/KITS/TRAYS/PACK) IMPLANT

## 2024-10-06 NOTE — Anesthesia Preprocedure Evaluation (Signed)
 Anesthesia Evaluation  Patient identified by MRN, date of birth, ID band Patient awake    Reviewed: Allergy & Precautions, NPO status , Patient's Chart, lab work & pertinent test results  History of Anesthesia Complications Negative for: history of anesthetic complications  Airway Mallampati: II  TM Distance: >3 FB Neck ROM: full    Dental no notable dental hx. (+) Chipped   Pulmonary neg pulmonary ROS, COPD (mild), former smoker   Pulmonary exam normal        Cardiovascular Exercise Tolerance: Good hypertension, On Medications (-) angina + CAD, + Past MI (2020), + Cardiac Stents (x6), + CABG and +CHF  Normal cardiovascular exam  Hx PVC   Neuro/Psych negative neurological ROS  negative psych ROS   GI/Hepatic negative GI ROS, Neg liver ROS,,,  Endo/Other  diabetesHypothyroidism    Renal/GU      Musculoskeletal   Abdominal   Peds  Hematology negative hematology ROS (+) Testicular cancer 2001   Anesthesia Other Findings Past Medical History: No date: Coronary artery disease     Comment:  s/p three coronary stents No date: Diabetes mellitus without complication (HCC) No date: Hypercholesterolemia No date: Hypertension No date: Hypothyroidism No date: Testicular cancer [facility])  Past Surgical History: 01/27/2022: CATARACT EXTRACTION W/PHACO; Left     Comment:  Procedure: CATARACT EXTRACTION PHACO AND INTRAOCULAR               LENS PLACEMENT (IOC) LEFT DIABETIC 6.17 01:17.1;                Surgeon: Mittie Gaskin, MD;  Location: [facility]               SURGERY CNTR;  Service: Ophthalmology;  Laterality: Left;              Diabetic 02/10/2022: CATARACT EXTRACTION W/PHACO; Right     Comment:  Procedure: CATARACT EXTRACTION PHACO AND INTRAOCULAR               LENS PLACEMENT (IOC) RIGHT DIABETIC 10.48 01:19.8;                Surgeon: Mittie Gaskin, MD;  Location: [facility]               SURGERY CNTR;  Service:  Ophthalmology;  Laterality:               Right;  Diabetic No date: CORONARY ANGIOPLASTY WITH STENT PLACEMENT 01/29/2020: CORONARY BALLOON ANGIOPLASTY; N/A     Comment:  Procedure: CORONARY STENT INTERVENTION;  Surgeon:               Ammon Blunt, MD;  Location: [facility] INVASIVE CV               LAB;  Service: Cardiovascular;  Laterality: N/A; 06/16/2018: CORONARY STENT INTERVENTION; N/A     Comment:  Procedure: CORONARY STENT INTERVENTION;  Surgeon:               Ammon Blunt, MD;  Location: [facility] INVASIVE CV               LAB;  Service: Cardiovascular;  Laterality: N/A; 06/16/2018: LEFT HEART CATH AND CORONARY ANGIOGRAPHY; N/A     Comment:  Procedure: LEFT HEART CATH AND CORONARY ANGIOGRAPHY;                Surgeon: Ammon Blunt, MD;  Location: [facility]               INVASIVE CV LAB;  Service: Cardiovascular;  Laterality:  N/A; 01/29/2020: LEFT HEART CATH AND CORONARY ANGIOGRAPHY; Left     Comment:  Procedure: LEFT HEART CATH AND CORONARY ANGIOGRAPHY;                Surgeon: Ammon Blunt, MD;  Location: [facility]               INVASIVE CV LAB;  Service: Cardiovascular;  Laterality:               Left; 02/29/2024: LEFT HEART CATH AND CORONARY ANGIOGRAPHY; Left     Comment:  Procedure: LEFT HEART CATH AND CORONARY ANGIOGRAPHY;                Surgeon: Ammon Blunt, MD;  Location: [facility]               INVASIVE CV LAB;  Service: Cardiovascular;  Laterality:               Left;  BMI    Body Mass Index: 29.24 kg/m      Reproductive/Obstetrics negative OB ROS                              Anesthesia Physical Anesthesia Plan  ASA: 4  Anesthesia Plan: General ETT   Post-op Pain Management:    Induction: Intravenous  PONV Risk Score and Plan: 2 and Ondansetron  and Dexamethasone   Airway Management Planned: Oral ETT  Additional Equipment:   Intra-op Plan:   Post-operative Plan: Extubation in OR  Informed Consent: I  have reviewed the patients History and Physical, chart, labs and discussed the procedure including the risks, benefits and alternatives for the proposed anesthesia with the patient or authorized representative who has indicated his/her understanding and acceptance.     Dental Advisory Given  Plan Discussed with: Anesthesiologist, CRNA and Surgeon  Anesthesia Plan Comments: (Patient consented for risks of anesthesia including but not limited to:  - adverse reactions to medications - damage to eyes, teeth, lips or other oral mucosa - nerve damage due to positioning  - sore throat or hoarseness - Damage to heart, brain, nerves, lungs, other parts of body or loss of life  Patient voiced understanding and assent.)         Anesthesia Quick Evaluation

## 2024-10-06 NOTE — Plan of Care (Signed)
  Problem: Fluid Volume: Goal: Ability to maintain a balanced intake and output will improve Outcome: Progressing   Problem: Health Behavior/Discharge Planning: Goal: Ability to identify and utilize available resources and services will improve Outcome: Progressing Goal: Ability to manage health-related needs will improve Outcome: Progressing   Problem: Metabolic: Goal: Ability to maintain appropriate glucose levels will improve Outcome: Progressing   Problem: Tissue Perfusion: Goal: Adequacy of tissue perfusion will improve Outcome: Progressing   Problem: Education: Goal: Knowledge of General Education information will improve Description: Including pain rating scale, medication(s)/side effects and non-pharmacologic comfort measures Outcome: Progressing   Problem: Clinical Measurements: Goal: Diagnostic test results will improve Outcome: Progressing Goal: Respiratory complications will improve Outcome: Progressing   Problem: Elimination: Goal: Will not experience complications related to urinary retention Outcome: Progressing   Problem: Pain Managment: Goal: General experience of comfort will improve and/or be controlled Outcome: Progressing

## 2024-10-06 NOTE — Consult Note (Signed)
 Consulting Department:  ED/ICU  Primary Physician:  Sadie Manna, MD  Chief Complaint: Expanding subdural hematoma  History of Present Illness: 10/06/2024 Jason JAYSON Sherlynn Mickey. is a 78 y.o. male who presents with the chief complaint of expanding subdural hematoma.  He has been followed in neurosurgery clinic at [facility] neurosurgery as [place].  Was recommended to have a repeat head CT due to progressive symptomatology.  Radiology called him into the emergency department given the findings.  Notably he has been having difficulty with significant weakness, headaches, confusion issues.  He has been listing to the right and has been knocking into things and dropping things on the right.  He is also noticed some facial weakness and some intermittent difficulty with his speech.  He has not had any new traumas.   Review of Systems:  A 10 point review of systems is negative, except for the pertinent positives and negatives detailed in the HPI.  Past Medical History: Past Medical History:  Diagnosis Date   Coronary artery disease    s/p three coronary stents   Diabetes mellitus without complication (HCC)    Hypercholesterolemia    Hypertension    Hypothyroidism    Testicular cancer [facility])     Past Surgical History: Past Surgical History:  Procedure Laterality Date   CATARACT EXTRACTION W/PHACO Left 01/27/2022   Procedure: CATARACT EXTRACTION PHACO AND INTRAOCULAR LENS PLACEMENT (IOC) LEFT DIABETIC 6.17 01:17.1;  Surgeon: Mittie Gaskin, MD;  Location: [facility];  Service: Ophthalmology;  Laterality: Left;  Diabetic   CATARACT EXTRACTION W/PHACO Right 02/10/2022   Procedure: CATARACT EXTRACTION PHACO AND INTRAOCULAR LENS PLACEMENT (IOC) RIGHT DIABETIC 10.48 01:19.8;  Surgeon: Mittie Gaskin, MD;  Location: [facility];  Service: Ophthalmology;  Laterality: Right;  Diabetic   CORONARY ANGIOPLASTY WITH STENT PLACEMENT     CORONARY BALLOON ANGIOPLASTY N/A 01/29/2020    Procedure: CORONARY STENT INTERVENTION;  Surgeon: Ammon Blunt, MD;  Location: [facility] INVASIVE CV LAB;  Service: Cardiovascular;  Laterality: N/A;   CORONARY STENT INTERVENTION N/A 06/16/2018   Procedure: CORONARY STENT INTERVENTION;  Surgeon: Ammon Blunt, MD;  Location: [facility] INVASIVE CV LAB;  Service: Cardiovascular;  Laterality: N/A;   LEFT HEART CATH AND CORONARY ANGIOGRAPHY N/A 06/16/2018   Procedure: LEFT HEART CATH AND CORONARY ANGIOGRAPHY;  Surgeon: Ammon Blunt, MD;  Location: [facility] INVASIVE CV LAB;  Service: Cardiovascular;  Laterality: N/A;   LEFT HEART CATH AND CORONARY ANGIOGRAPHY Left 01/29/2020   Procedure: LEFT HEART CATH AND CORONARY ANGIOGRAPHY;  Surgeon: Ammon Blunt, MD;  Location: [facility] INVASIVE CV LAB;  Service: Cardiovascular;  Laterality: Left;   LEFT HEART CATH AND CORONARY ANGIOGRAPHY Left 02/29/2024   Procedure: LEFT HEART CATH AND CORONARY ANGIOGRAPHY;  Surgeon: Ammon Blunt, MD;  Location: [facility] INVASIVE CV LAB;  Service: Cardiovascular;  Laterality: Left;    Allergies: Allergies as of 10/05/2024   (No Known Allergies)    Medications:  Current Facility-Administered Medications:    0.9 %  sodium chloride  infusion, , Intravenous, Continuous, Zella, Mir M, MD, Last Rate: 100 mL/hr at 10/06/24 9365, Infusion Verify at 10/06/24 9365   acetaminophen  (TYLENOL ) tablet 650 mg, 650 mg, Oral, Q6H PRN **OR** acetaminophen  (TYLENOL ) suppository 650 mg, 650 mg, Rectal, Q6H PRN, Zella, Mir M, MD   albuterol  (PROVENTIL ) (2.5 MG/3ML) 0.083% nebulizer solution 2.5 mg, 2.5 mg, Nebulization, Q2H PRN, Zella, Mir M, MD   Chlorhexidine Gluconate Cloth 2 % PADS 6 each, 6 each, Topical, Q0600, Zella Katha HERO, MD, 6 each at 10/05/24 1102  ezetimibe  (ZETIA ) tablet 10 mg, 10 mg, Oral, Daily, Zella, Mir M, MD, 10 mg at 10/05/24 1054   hydrALAZINE  (APRESOLINE ) injection 10 mg, 10 mg, Intravenous, Q6H PRN, Zella, Mir M, MD, 10 mg at  10/06/24 0536   insulin  aspart (novoLOG ) injection 0-15 Units, 0-15 Units, Subcutaneous, TID WC, Zella, Mir M, MD, 3 Units at 10/05/24 1704   insulin  aspart (novoLOG ) injection 0-5 Units, 0-5 Units, Subcutaneous, QHS, Zella, Mir M, MD   labetalol  (NORMODYNE ) injection 20 mg, 20 mg, Intravenous, Q3H PRN, Mansy, Jan A, MD, 20 mg at 10/06/24 9688   metoprolol  tartrate (LOPRESSOR ) tablet 37.5 mg, 37.5 mg, Oral, BID, Zella, Mir M, MD, 37.5 mg at 10/05/24 2104   ondansetron  (ZOFRAN ) tablet 4 mg, 4 mg, Oral, Q6H PRN **OR** ondansetron  (ZOFRAN ) injection 4 mg, 4 mg, Intravenous, Q6H PRN, Zella, Mir M, MD   pantoprazole  (PROTONIX ) EC tablet 40 mg, 40 mg, Oral, Daily, Zella, Mir M, MD, 40 mg at 10/05/24 1054   traZODone (DESYREL) tablet 25 mg, 25 mg, Oral, QHS PRN, Zella, Mir M, MD   Social History: Social History   Tobacco Use   Smoking status: Former    Current packs/day: 0.00    Types: Cigarettes    Quit date: 2014    Years since quitting: 11.8   Smokeless tobacco: Never  Vaping Use   Vaping status: Never Used  Substance Use Topics   Alcohol  use: Not Currently   Drug use: Never    Family Medical History: Family History  Problem Relation Age of Onset   Strabismus Mother    Stroke Mother    CAD Father     Physical Examination: Vitals:   10/06/24 0600 10/06/24 0630  BP: (!) 158/70 (!) 150/66  Pulse: 88 85  Resp: (!) 22 18  Temp:    SpO2: 96% 96%     General: Patient is well developed, well nourished, calm, collected, and in no apparent distress.  NEUROLOGICAL:  General: In no acute distress.   Awake, alert, oriented to person, place, and time.  Pupils equal round and reactive to light.  Right sided facial droop forehead sparing.  Tongue protrusion is midline.  Significant pronator drift noted in the right upper extremity, weakness in the right lower extremity compared to the left  GCS: 14   Bilateral upper and lower extremity sensation is  intact to light touch.  Imaging: CT HEAD WO CONTRAST ( ) Result Date: 10/05/2024 EXAM: CT HEAD WITHOUT CONTRAST 10/05/2024 08:45:36 AM TECHNIQUE: CT of the head was performed without the administration of intravenous contrast. Automated exposure control, iterative reconstruction, and/or weight based adjustment of the mA/kV was utilized to reduce the radiation dose to as low as reasonably achievable. COMPARISON: 08/10/2024 CLINICAL HISTORY: Subdural fluid collection FINDINGS: BRAIN AND VENTRICLES: Redemonstrated large extra-axial collection along the left cerebral convexity, measures up to 2.8 cm in thickness on coronal images series 4 image 28. This collection previously measured approximately 10 mm in thickness. On oblique axial images, the collection measures up to 2.7 cm in thickness, previously measuring approximately 9 mm. There are layering hyperattenuating blood products posteriorly compatible with acute hemorrhage with additional areas of hypo to isoattenuating blood products anteriorly, findings suggestive of acute on chronic subdural hematoma. There is significant interval increase in mass effect on the left cerebral hemisphere with sulcal effacement throughout the left cerebral hemisphere. There is 9 mm rightward midline shift at the level of the lateral ventricles, previously 2 mm. There is increased effacement of the left  lateral ventricle. No hydrocephalus. No acute hemorrhage. No evidence of parenchymal hemorrhage. No evidence of intraventricular hemorrhage. No evidence of acute infarct. Similar appearance of chronic microvascular ischemic changes. ORBITS: No acute abnormality. SINUSES: Mild mucosal thickening in ethmoid air cells. SOFT TISSUES AND SKULL: No acute soft tissue abnormality. No skull fracture. IMPRESSION: 1. Significant interval enlargement of acute on chronic left cerebral convexity subdural hematoma, now up to 2.8 cm. 2. Associated marked interval increase in mass effect with  left hemispheric sulcal effacement and 9 mm rightward midline shift. 3. Chronic microvascular ischemic changes, similar to prior. Electronically signed by: Donnice Mania MD 10/05/2024 09:00 AM EST RP Workstation: [id number]     I have personally reviewed the images and agree with the above interpretation.  Labs:    Latest Ref Rng & Units 10/06/2024    2:46 AM 10/05/2024    9:18 AM 06/14/2024    2:13 AM  CBC  WBC 4.0 - 10.5 K/uL 7.5  5.7  8.9   Hemoglobin 13.0 - 17.0 g/dL 86.5  87.6  88.2   Hematocrit 39.0 - 52.0 % 40.3  37.4  36.3   Platelets 150 - 400 K/uL 189  175  197       Latest Ref Rng & Units 10/06/2024    2:46 AM 10/05/2024    5:28 PM 10/05/2024    9:18 AM  BMP  Glucose 70 - 99 mg/dL 881   877   BUN 8 - 23 mg/dL 13   19   Creatinine 9.38 - 1.24 mg/dL 9.22   9.05   Sodium 864 - 145 mmol/L 139   139   Potassium 3.5 - 5.1 mmol/L 4.0  4.3  4.0   Chloride 98 - 111 mmol/L 104   104   CO2 22 - 32 mmol/L 22   25   Calcium  8.9 - 10.3 mg/dL 8.9   9.2     INR  0.9 (11/14 0918)   Assessment and Plan: Mr. Batson is a pleasant 78 y.o. male with history of a subdural hematoma been followed in clinic.  He said progressive worsening of his symptoms and was sent for a noncontrast head CT.  He has had worsening headaches, confusion, right sided predominant weakness with right sided hemineglect.  He said difficulty with speech production and slurring of his speech.  On physical examination he has a right sided facial droop as well as right-sided hemibody weakness with a positive pronator drift and weakness in his right hip flexor.  Repeat head CT demonstrated a expansile subdural hematoma with significant left-to-right midline shift this measured up to 2.8 cm in thickness with an entire centimeter of left-to-right midline shift and compression of the cortical surfaces.  Given the progression of his neurological deficits, the expansion of the subdural hematoma, worsening headaches we  discussed going forward with a bur hole evacuation of a subdural hematoma with drain placement.  We discussed risk and benefits of surgery, they would like to go forward with the procedure given his worsening deficits.  Will plan to do this in first thing tomorrow morning.  Please make him n.p.o. hold any anticoagulation, obtain coagulation studies, he will be admitted back to the stepdown/ICU unit postoperatively.  Penne MICAEL Sharps, MD/MSCR Dept. of Neurosurgery

## 2024-10-06 NOTE — Interval H&P Note (Signed)
 History and Physical Interval Note:  10/06/2024 7:30 AM  Jason Mclean [name].  has presented today for surgery, with the diagnosis of Left Side Chronic Subdural Hematoma.  The various methods of treatment have been discussed with the patient and family. After consideration of risks, benefits and other options for treatment, the patient has consented to  Procedure(s): Left Side [facility] of SDH (Left) as a surgical intervention.  The patient's history has been reviewed, patient examined, no change in status, stable for surgery.  I have reviewed the patient's chart and labs.  Questions were answered to the patient's satisfaction.    Heart and lungs clear   Penne LELON Sharps

## 2024-10-06 NOTE — Anesthesia Procedure Notes (Signed)
 Procedure Name: Intubation Date/Time: 10/06/2024 7:47 AM  Performed by: Jaylene Nest, CRNAPre-anesthesia Checklist: Patient identified, Patient being monitored, Timeout performed, Emergency Drugs available and Suction available Patient Re-evaluated:Patient Re-evaluated prior to induction Oxygen Delivery Method: Circle system utilized Preoxygenation: Pre-oxygenation with 100% oxygen Induction Type: IV induction Ventilation: Mask ventilation without difficulty Laryngoscope Size: McGrath and 4 Grade View: Grade I Tube type: Oral Tube size: 7.5 mm Number of attempts: 1 Airway Equipment and Method: Stylet and Video-laryngoscopy Placement Confirmation: ETT inserted through vocal cords under direct vision, positive ETCO2 and breath sounds checked- equal and bilateral Secured at: 22 cm Tube secured with: Tape Dental Injury: Teeth and Oropharynx as per pre-operative assessment

## 2024-10-06 NOTE — Hospital Course (Addendum)
 Hospital course / significant events:   Jason Mclean. is a 78 y.o. male with medical history significant for hypertension, hyperlipidemia, hypothyroidism, non-insulin -dependent type 2 diabetes and a known chronic subdural hematoma after fall 6 mos ago. (+)confusion, weakness over past week or so. On ASA. He had an outpatient CT scan done by neurosurgery Dr. Clois, showing expansion of his chronic subdural. Sent to ED>   11/14: admitted to hospitalist 11/15: burr hole L frontal. Stepdown unit close monitoring. Pt reports improvement  11/16: remains stable, transfer off SDU, plan repeat CT head tomorrow  11/17: CT head stable, drain removed, per neurosurgery ok to dc on keppra and close outpatient f/u    Consultants:  Neurosurgery   Procedures/Surgeries: 10/06/24 left frontal burr hole for drainage of subdural hematoma w/ Dr Claudene       ASSESSMENT & PLAN:   Acute on chronic subdural hematoma-in the setting of known subdural/hygroma S/p left frontal burr hole for drainage of subdural hematoma w/ Dr Claudene performed 10/06/24  Neurosurgery following outpatient  Hold ASA Keppra 500 mg po q12h x1 week  HTN Goal SBP <160  Follow outpatient   Type 2 diabetes Carb diet resume home glimepiride , Rybelsus   GERD Pantoprazole  40 mg po daily    Hyperlipidemia Follow outpatient   Hypothryoid Synthroid     Overweight/Class 1 obesity based on BMI: Body mass index is 29.24 kg/m.SABRA Significantly low or high BMI is associated with higher medical risk.  Underweight - under 18  overweight - 25 to 29 obese - 30 or more Class 1 obesity: BMI of 30.0 to 34 Class 2 obesity: BMI of 35.0 to 39 Class 3 obesity: BMI of 40.0 to 49 Super Morbid Obesity: BMI 50-59 Super-super Morbid Obesity: BMI 60+ Healthy nutrition and physical activity advised as adjunct to other disease management and risk reduction treatments    DVT prophylaxis: SCD, resume Rx ppx tomorrow  IV fluids: no continuous  IV fluids  Nutrition: carb modified diet Central lines / other devices: drain in L frontal burr hole  Code Status: FULL CODE ACP documentation reviewed:  none on file in [facility]  TOC needs: outpatient PT  Medical barriers to dispo: neuro monitoring following surgery. Expected medical readiness for discharge per neurosurgery

## 2024-10-06 NOTE — H&P (View-Only) (Signed)
 Consulting Department:  ED/ICU  Primary Physician:  Sadie Manna, MD  Chief Complaint: Expanding subdural hematoma  History of Present Illness: 10/06/2024 Jason JAYSON Sherlynn Mickey. is a 78 y.o. male who presents with the chief complaint of expanding subdural hematoma.  He has been followed in neurosurgery clinic at [facility] neurosurgery as [place].  Was recommended to have a repeat head CT due to progressive symptomatology.  Radiology called him into the emergency department given the findings.  Notably he has been having difficulty with significant weakness, headaches, confusion issues.  He has been listing to the right and has been knocking into things and dropping things on the right.  He is also noticed some facial weakness and some intermittent difficulty with his speech.  He has not had any new traumas.   Review of Systems:  A 10 point review of systems is negative, except for the pertinent positives and negatives detailed in the HPI.  Past Medical History: Past Medical History:  Diagnosis Date   Coronary artery disease    s/p three coronary stents   Diabetes mellitus without complication (HCC)    Hypercholesterolemia    Hypertension    Hypothyroidism    Testicular cancer [facility])     Past Surgical History: Past Surgical History:  Procedure Laterality Date   CATARACT EXTRACTION W/PHACO Left 01/27/2022   Procedure: CATARACT EXTRACTION PHACO AND INTRAOCULAR LENS PLACEMENT (IOC) LEFT DIABETIC 6.17 01:17.1;  Surgeon: Mittie Gaskin, MD;  Location: [facility];  Service: Ophthalmology;  Laterality: Left;  Diabetic   CATARACT EXTRACTION W/PHACO Right 02/10/2022   Procedure: CATARACT EXTRACTION PHACO AND INTRAOCULAR LENS PLACEMENT (IOC) RIGHT DIABETIC 10.48 01:19.8;  Surgeon: Mittie Gaskin, MD;  Location: [facility];  Service: Ophthalmology;  Laterality: Right;  Diabetic   CORONARY ANGIOPLASTY WITH STENT PLACEMENT     CORONARY BALLOON ANGIOPLASTY N/A 01/29/2020    Procedure: CORONARY STENT INTERVENTION;  Surgeon: Ammon Blunt, MD;  Location: [facility] INVASIVE CV LAB;  Service: Cardiovascular;  Laterality: N/A;   CORONARY STENT INTERVENTION N/A 06/16/2018   Procedure: CORONARY STENT INTERVENTION;  Surgeon: Ammon Blunt, MD;  Location: [facility] INVASIVE CV LAB;  Service: Cardiovascular;  Laterality: N/A;   LEFT HEART CATH AND CORONARY ANGIOGRAPHY N/A 06/16/2018   Procedure: LEFT HEART CATH AND CORONARY ANGIOGRAPHY;  Surgeon: Ammon Blunt, MD;  Location: [facility] INVASIVE CV LAB;  Service: Cardiovascular;  Laterality: N/A;   LEFT HEART CATH AND CORONARY ANGIOGRAPHY Left 01/29/2020   Procedure: LEFT HEART CATH AND CORONARY ANGIOGRAPHY;  Surgeon: Ammon Blunt, MD;  Location: [facility] INVASIVE CV LAB;  Service: Cardiovascular;  Laterality: Left;   LEFT HEART CATH AND CORONARY ANGIOGRAPHY Left 02/29/2024   Procedure: LEFT HEART CATH AND CORONARY ANGIOGRAPHY;  Surgeon: Ammon Blunt, MD;  Location: [facility] INVASIVE CV LAB;  Service: Cardiovascular;  Laterality: Left;    Allergies: Allergies as of 10/05/2024   (No Known Allergies)    Medications:  Current Facility-Administered Medications:    0.9 %  sodium chloride  infusion, , Intravenous, Continuous, Zella, Mir M, MD, Last Rate: 100 mL/hr at 10/06/24 9365, Infusion Verify at 10/06/24 9365   acetaminophen  (TYLENOL ) tablet 650 mg, 650 mg, Oral, Q6H PRN **OR** acetaminophen  (TYLENOL ) suppository 650 mg, 650 mg, Rectal, Q6H PRN, Zella, Mir M, MD   albuterol  (PROVENTIL ) (2.5 MG/3ML) 0.083% nebulizer solution 2.5 mg, 2.5 mg, Nebulization, Q2H PRN, Zella, Mir M, MD   Chlorhexidine Gluconate Cloth 2 % PADS 6 each, 6 each, Topical, Q0600, Zella Katha HERO, MD, 6 each at 10/05/24 1102  ezetimibe  (ZETIA ) tablet 10 mg, 10 mg, Oral, Daily, Zella, Mir M, MD, 10 mg at 10/05/24 1054   hydrALAZINE  (APRESOLINE ) injection 10 mg, 10 mg, Intravenous, Q6H PRN, Zella, Mir M, MD, 10 mg at  10/06/24 0536   insulin  aspart (novoLOG ) injection 0-15 Units, 0-15 Units, Subcutaneous, TID WC, Zella, Mir M, MD, 3 Units at 10/05/24 1704   insulin  aspart (novoLOG ) injection 0-5 Units, 0-5 Units, Subcutaneous, QHS, Zella, Mir M, MD   labetalol  (NORMODYNE ) injection 20 mg, 20 mg, Intravenous, Q3H PRN, Mansy, Jan A, MD, 20 mg at 10/06/24 9688   metoprolol  tartrate (LOPRESSOR ) tablet 37.5 mg, 37.5 mg, Oral, BID, Zella, Mir M, MD, 37.5 mg at 10/05/24 2104   ondansetron  (ZOFRAN ) tablet 4 mg, 4 mg, Oral, Q6H PRN **OR** ondansetron  (ZOFRAN ) injection 4 mg, 4 mg, Intravenous, Q6H PRN, Zella, Mir M, MD   pantoprazole  (PROTONIX ) EC tablet 40 mg, 40 mg, Oral, Daily, Zella, Mir M, MD, 40 mg at 10/05/24 1054   traZODone (DESYREL) tablet 25 mg, 25 mg, Oral, QHS PRN, Zella, Mir M, MD   Social History: Social History   Tobacco Use   Smoking status: Former    Current packs/day: 0.00    Types: Cigarettes    Quit date: 2014    Years since quitting: 11.8   Smokeless tobacco: Never  Vaping Use   Vaping status: Never Used  Substance Use Topics   Alcohol  use: Not Currently   Drug use: Never    Family Medical History: Family History  Problem Relation Age of Onset   Strabismus Mother    Stroke Mother    CAD Father     Physical Examination: Vitals:   10/06/24 0600 10/06/24 0630  BP: (!) 158/70 (!) 150/66  Pulse: 88 85  Resp: (!) 22 18  Temp:    SpO2: 96% 96%     General: Patient is well developed, well nourished, calm, collected, and in no apparent distress.  NEUROLOGICAL:  General: In no acute distress.   Awake, alert, oriented to person, place, and time.  Pupils equal round and reactive to light.  Right sided facial droop forehead sparing.  Tongue protrusion is midline.  Significant pronator drift noted in the right upper extremity, weakness in the right lower extremity compared to the left  GCS: 14   Bilateral upper and lower extremity sensation is  intact to light touch.  Imaging: CT HEAD WO CONTRAST ( ) Result Date: 10/05/2024 EXAM: CT HEAD WITHOUT CONTRAST 10/05/2024 08:45:36 AM TECHNIQUE: CT of the head was performed without the administration of intravenous contrast. Automated exposure control, iterative reconstruction, and/or weight based adjustment of the mA/kV was utilized to reduce the radiation dose to as low as reasonably achievable. COMPARISON: 08/10/2024 CLINICAL HISTORY: Subdural fluid collection FINDINGS: BRAIN AND VENTRICLES: Redemonstrated large extra-axial collection along the left cerebral convexity, measures up to 2.8 cm in thickness on coronal images series 4 image 28. This collection previously measured approximately 10 mm in thickness. On oblique axial images, the collection measures up to 2.7 cm in thickness, previously measuring approximately 9 mm. There are layering hyperattenuating blood products posteriorly compatible with acute hemorrhage with additional areas of hypo to isoattenuating blood products anteriorly, findings suggestive of acute on chronic subdural hematoma. There is significant interval increase in mass effect on the left cerebral hemisphere with sulcal effacement throughout the left cerebral hemisphere. There is 9 mm rightward midline shift at the level of the lateral ventricles, previously 2 mm. There is increased effacement of the left  lateral ventricle. No hydrocephalus. No acute hemorrhage. No evidence of parenchymal hemorrhage. No evidence of intraventricular hemorrhage. No evidence of acute infarct. Similar appearance of chronic microvascular ischemic changes. ORBITS: No acute abnormality. SINUSES: Mild mucosal thickening in ethmoid air cells. SOFT TISSUES AND SKULL: No acute soft tissue abnormality. No skull fracture. IMPRESSION: 1. Significant interval enlargement of acute on chronic left cerebral convexity subdural hematoma, now up to 2.8 cm. 2. Associated marked interval increase in mass effect with  left hemispheric sulcal effacement and 9 mm rightward midline shift. 3. Chronic microvascular ischemic changes, similar to prior. Electronically signed by: Donnice Mania MD 10/05/2024 09:00 AM EST RP Workstation: [id number]     I have personally reviewed the images and agree with the above interpretation.  Labs:    Latest Ref Rng & Units 10/06/2024    2:46 AM 10/05/2024    9:18 AM 06/14/2024    2:13 AM  CBC  WBC 4.0 - 10.5 K/uL 7.5  5.7  8.9   Hemoglobin 13.0 - 17.0 g/dL 86.5  87.6  88.2   Hematocrit 39.0 - 52.0 % 40.3  37.4  36.3   Platelets 150 - 400 K/uL 189  175  197       Latest Ref Rng & Units 10/06/2024    2:46 AM 10/05/2024    5:28 PM 10/05/2024    9:18 AM  BMP  Glucose 70 - 99 mg/dL 881   877   BUN 8 - 23 mg/dL 13   19   Creatinine 9.38 - 1.24 mg/dL 9.22   9.05   Sodium 864 - 145 mmol/L 139   139   Potassium 3.5 - 5.1 mmol/L 4.0  4.3  4.0   Chloride 98 - 111 mmol/L 104   104   CO2 22 - 32 mmol/L 22   25   Calcium  8.9 - 10.3 mg/dL 8.9   9.2     INR  0.9 (11/14 0918)   Assessment and Plan: Mr. Batson is a pleasant 78 y.o. male with history of a subdural hematoma been followed in clinic.  He said progressive worsening of his symptoms and was sent for a noncontrast head CT.  He has had worsening headaches, confusion, right sided predominant weakness with right sided hemineglect.  He said difficulty with speech production and slurring of his speech.  On physical examination he has a right sided facial droop as well as right-sided hemibody weakness with a positive pronator drift and weakness in his right hip flexor.  Repeat head CT demonstrated a expansile subdural hematoma with significant left-to-right midline shift this measured up to 2.8 cm in thickness with an entire centimeter of left-to-right midline shift and compression of the cortical surfaces.  Given the progression of his neurological deficits, the expansion of the subdural hematoma, worsening headaches we  discussed going forward with a bur hole evacuation of a subdural hematoma with drain placement.  We discussed risk and benefits of surgery, they would like to go forward with the procedure given his worsening deficits.  Will plan to do this in first thing tomorrow morning.  Please make him n.p.o. hold any anticoagulation, obtain coagulation studies, he will be admitted back to the stepdown/ICU unit postoperatively.  Penne MICAEL Sharps, MD/MSCR Dept. of Neurosurgery

## 2024-10-06 NOTE — Plan of Care (Signed)

## 2024-10-06 NOTE — Progress Notes (Signed)
 PROGRESS NOTE    Jason Mclean.   FMW:[id number] DOB: 07/25/1946  DOA: 10/05/2024 Date of Service: 10/06/24 which is hospital day 1  PCP: Sadie Manna, MD    Hospital course / significant events:   Jason Mclean. is a 78 y.o. male with medical history significant for hypertension, hyperlipidemia, hypothyroidism, non-insulin -dependent type 2 diabetes and a known chronic subdural hematoma after fall 6 mos ago. (+)confusion, weakness over past week or so. On ASA. He had an outpatient CT scan done by neurosurgery Dr. Clois, showing expansion of his chronic subdural. Sent to ED>   11/14: admitted to hospitalist 11/15: burr hole L frontal. Stepdown unit close monitoring. Pt reports improvement   CT head today 10/06/24  IMPRESSION: 1. Interval decrease in left cerebral convexity subdural hematoma thickness to 22 mm from 28 mm, with postoperative gas within the subdural space. 2. New small volume hemorrhage in the left sylvian fissure. 3. Improved rightward midline shift measuring 5 mm, previously 9 mm. 4. Left frontal burr hole.   Consultants:  Neurosurgery   Procedures/Surgeries: 10/06/24 left frontal burr hole for drainage of subdural hematoma w/ Dr Claudene       ASSESSMENT & PLAN:   Acute on chronic subdural hematoma-in the setting of known subdural/hygroma S/p left frontal burr hole for drainage of subdural hematoma w/ Dr Claudene performed 10/06/24  Neurosurgery following Hold ASA Goals SBP <160 see below Keppra 500 mg po q12h Serial CT   HTN Goal SBP <160  Home metoprolol  tartrate 37.5 mg bid  IV hydralazine  10 mg q6h prn SBP >160   Type 2 diabetes Carb diet SSI achs  Hold home glimepiride , metformin , Rybelsus   GERD Pantoprazole  40 mg po daily    Hyperlipidemia Ezetimibe  10 mg daily  Hypothryoid Synthroid     Overweight/Class 1 obesity based on BMI: Body mass index is 29.24 kg/m.SABRA Significantly low or high BMI is associated with higher  medical risk.  Underweight - under 18  overweight - 25 to 29 obese - 30 or more Class 1 obesity: BMI of 30.0 to 34 Class 2 obesity: BMI of 35.0 to 39 Class 3 obesity: BMI of 40.0 to 49 Super Morbid Obesity: BMI 50-59 Super-super Morbid Obesity: BMI 60+ Healthy nutrition and physical activity advised as adjunct to other disease management and risk reduction treatments    DVT prophylaxis: SCD IV fluids: no continuous IV fluids  Nutrition: carb modified diet Central lines / other devices: drain in L frontal burr hole  Code Status: FULL CODE ACP documentation reviewed:  none on file in [facility]  TOC needs: TBD expect will need at least HH/DME Medical barriers to dispo: neuro monitoring following surgery. Expected medical readiness for discharge several days.              Subjective / Brief ROS:  Patient reports feeling better today Denies CP/SOB.  Pain controlled.  Denies new weakness.  Tolerating diet.  Reports no concerns w/ urination/defecation.   Family Communication: support person at bedside on rounds     Objective Findings:  Vitals:   10/06/24 1330 10/06/24 1400 10/06/24 1430 10/06/24 1510  BP: 137/72 (!) 125/59 131/70 126/66  Pulse: 83 76 79 80  Resp: (!) 21 18 17  (!) 23  Temp:      TempSrc:      SpO2: 94% 98% 97% 96%  Weight:      Height:        Intake/Output Summary (Last 24 hours) at 10/06/2024 1529 Last data  filed at 10/06/2024 1510 Gross per 24 hour  Intake 1896.88 ml  Output 1980 ml  Net -83.12 ml   Filed Weights   10/05/24 0908 10/05/24 1030  Weight: 89.8 kg 89.8 kg    Examination:  Physical Exam Constitutional:      General: He is not in acute distress. Cardiovascular:     Rate and Rhythm: Normal rate and regular rhythm.  Pulmonary:     Effort: Pulmonary effort is normal.  Neurological:     Mental Status: He is alert and oriented to person, place, and time.  Psychiatric:        Mood and Affect: Mood normal.        Behavior:  Behavior normal.          Scheduled Medications:   Chlorhexidine Gluconate Cloth  6 each Topical Q0600   ezetimibe   10 mg Oral Daily   insulin  aspart  0-15 Units Subcutaneous TID WC   insulin  aspart  0-5 Units Subcutaneous QHS   levETIRAcetam  500 mg Oral BID   [START ON 10/07/2024] levothyroxine   75 mcg Oral QAC breakfast   metoprolol  tartrate  37.5 mg Oral BID   pantoprazole   40 mg Oral Daily    Continuous Infusions:   PRN Medications:  acetaminophen  **OR** acetaminophen , albuterol , hydrALAZINE , labetalol , ondansetron  **OR** ondansetron  (ZOFRAN ) IV, traZODone  Antimicrobials from admission:  Anti-infectives (From admission, onward)    None           Data Reviewed:  I have personally reviewed the following...  CBC: Recent Labs  Lab 10/05/24 0918 10/06/24 0246  WBC 5.7 7.5  NEUTROABS 3.1  --   HGB 12.3* 13.4  HCT 37.4* 40.3  MCV 90.1 88.0  PLT 175 189   Basic Metabolic Panel: Recent Labs  Lab 10/05/24 0918 10/05/24 1728 10/06/24 0246  NA 139  --  139  K 4.0 4.3 4.0  CL 104  --  104  CO2 25  --  22  GLUCOSE 122*  --  118*  BUN 19  --  13  CREATININE 0.94  --  0.77  CALCIUM  9.2  --  8.9  MG  --  1.8  --    GFR: Estimated Creatinine Clearance: 87 mL/min (by C-G formula based on SCr of 0.77 mg/dL). Liver Function Tests: Recent Labs  Lab 10/05/24 0918  AST 24  ALT 12  ALKPHOS 73  BILITOT 0.7  PROT 6.8  ALBUMIN 3.9   No results for input(s): LIPASE, AMYLASE in the last 168 hours. No results for input(s): AMMONIA in the last 168 hours. Coagulation Profile: Recent Labs  Lab 10/05/24 0918  INR 0.9   Cardiac Enzymes: No results for input(s): CKTOTAL, CKMB, CKMBINDEX, TROPONINI in the last 168 hours. BNP (last 3 results) No results for input(s): PROBNP in the last 8760 hours. HbA1C: Recent Labs    10/05/24 0918  HGBA1C 7.1*   CBG: Recent Labs  Lab 10/05/24 1556 10/05/24 2100 10/06/24 0713 10/06/24 0856  10/06/24 1110  GLUCAP 165* 125* 160* 186* 222*   Lipid Profile: No results for input(s): CHOL, HDL, LDLCALC, TRIG, CHOLHDL, LDLDIRECT in the last 72 hours. Thyroid  Function Tests: No results for input(s): TSH, T4TOTAL, FREET4, T3FREE, THYROIDAB in the last 72 hours. Anemia Panel: No results for input(s): VITAMINB12, FOLATE, FERRITIN, TIBC, IRON, RETICCTPCT in the last 72 hours. Most Recent Urinalysis On File:     Component Value Date/Time   COLORURINE YELLOW (A) 10/05/2024 1048   APPEARANCEUR CLEAR (A) 10/05/2024 1048  LABSPEC 1.019 10/05/2024 1048   PHURINE 6.0 10/05/2024 1048   GLUCOSEU NEGATIVE 10/05/2024 1048   HGBUR NEGATIVE 10/05/2024 1048   BILIRUBINUR NEGATIVE 10/05/2024 1048   KETONESUR NEGATIVE 10/05/2024 1048   PROTEINUR NEGATIVE 10/05/2024 1048   NITRITE NEGATIVE 10/05/2024 1048   LEUKOCYTESUR NEGATIVE 10/05/2024 1048   Sepsis Labs: @LABRCNTIP (procalcitonin:4,lacticidven:4) Microbiology: Recent Results (from the past 240 hours)  MRSA Next Gen by PCR, Nasal     Status: None   Collection Time: 10/05/24 10:37 AM   Specimen: Urine, Clean Catch; Nasal Swab  Result Value Ref Range Status   MRSA by PCR Next Gen NOT DETECTED NOT DETECTED Final    Comment: (NOTE) The GeneXpert MRSA Assay (FDA approved for NASAL specimens only), is one component of a comprehensive MRSA colonization surveillance program. It is not intended to diagnose MRSA infection nor to guide or monitor treatment for MRSA infections. Test performance is not FDA approved in patients less than 21 years old. Performed at [facility], [address] [place], [place] [address]       Radiology Studies last 3 days: CT HEAD WO CONTRAST ( ) Result Date: 10/06/2024 EXAM: CT HEAD WITHOUT CONTRAST 10/06/2024 01:15:17 PM TECHNIQUE: CT of the head was performed without the administration of intravenous contrast. Automated exposure control, iterative  reconstruction, and/or weight based adjustment of the mA/kV was utilized to reduce the radiation dose to as low as reasonably achievable. COMPARISON: 10/05/2024 CLINICAL HISTORY: Status post subdural hematoma evacuation FINDINGS: BRAIN AND VENTRICLES: Small volume hemorrhage in the left sylvian fissure is new. Decreased left cerebral convexity subdural hematoma measuring 22 mm in thickness, previously 28 mm. Gas within the subdural space. 5 mm rightward midline shift, previously 9 mm. Chronic white matter microvascular ischemic changes. No evidence of acute infarct. No hydrocephalus. ORBITS: No acute abnormality. SINUSES: No acute abnormality. SOFT TISSUES AND SKULL: Interval left frontal burr hole drainage of subdural hematoma. Left frontal burr hole. No skull fracture. IMPRESSION: 1. Interval decrease in left cerebral convexity subdural hematoma thickness to 22 mm from 28 mm, with postoperative gas within the subdural space. 2. New small volume hemorrhage in the left sylvian fissure. 3. Improved rightward midline shift measuring 5 mm, previously 9 mm. 4. Left frontal burr hole. Electronically signed by: Lonni Necessary MD 10/06/2024 02:29 PM EST RP Workstation: [id number]   CT HEAD WO CONTRAST ( ) Result Date: 10/05/2024 EXAM: CT HEAD WITHOUT CONTRAST 10/05/2024 08:45:36 AM TECHNIQUE: CT of the head was performed without the administration of intravenous contrast. Automated exposure control, iterative reconstruction, and/or weight based adjustment of the mA/kV was utilized to reduce the radiation dose to as low as reasonably achievable. COMPARISON: 08/10/2024 CLINICAL HISTORY: Subdural fluid collection FINDINGS: BRAIN AND VENTRICLES: Redemonstrated large extra-axial collection along the left cerebral convexity, measures up to 2.8 cm in thickness on coronal images series 4 image 28. This collection previously measured approximately 10 mm in thickness. On oblique axial images, the collection measures up to  2.7 cm in thickness, previously measuring approximately 9 mm. There are layering hyperattenuating blood products posteriorly compatible with acute hemorrhage with additional areas of hypo to isoattenuating blood products anteriorly, findings suggestive of acute on chronic subdural hematoma. There is significant interval increase in mass effect on the left cerebral hemisphere with sulcal effacement throughout the left cerebral hemisphere. There is 9 mm rightward midline shift at the level of the lateral ventricles, previously 2 mm. There is increased effacement of the left lateral ventricle. No hydrocephalus. No acute hemorrhage. No evidence of  parenchymal hemorrhage. No evidence of intraventricular hemorrhage. No evidence of acute infarct. Similar appearance of chronic microvascular ischemic changes. ORBITS: No acute abnormality. SINUSES: Mild mucosal thickening in ethmoid air cells. SOFT TISSUES AND SKULL: No acute soft tissue abnormality. No skull fracture. IMPRESSION: 1. Significant interval enlargement of acute on chronic left cerebral convexity subdural hematoma, now up to 2.8 cm. 2. Associated marked interval increase in mass effect with left hemispheric sulcal effacement and 9 mm rightward midline shift. 3. Chronic microvascular ischemic changes, similar to prior. Electronically signed by: Donnice Mania MD 10/05/2024 09:00 AM EST RP Workstation: [id number]          Laneta Blunt, DO [facility] 10/06/2024, 3:29 PM    Dictation software may have been used to generate the above note. Typos may occur and escape review in typed/dictated notes. Please contact Dr Blunt directly for clarity if needed.  Staff may message me via secure chat in Epic  but this may not receive an immediate response,  please page me for urgent matters!  If 7PM-7AM, please contact night coverage [email or web address]

## 2024-10-06 NOTE — Transfer of Care (Signed)
 Immediate Anesthesia Transfer of Care Note  Patient: Jason Mclean.  Procedure(s) Performed: Left Side [employer] Evacuation of SDH (Left: Scalp)  Patient Location: PACU  Anesthesia Type:General  Level of Consciousness: awake  Airway & Oxygen Therapy: Patient Spontanous Breathing and Patient connected to face mask oxygen  Post-op Assessment: Report given to RN and Post -op Vital signs reviewed and stable  Post vital signs: Reviewed and stable  Last Vitals:  Vitals Value Taken Time  BP 146/70 10/06/24 08:54  Temp    Pulse 77 10/06/24 08:59  Resp 16 10/06/24 08:59  SpO2 100 % 10/06/24 08:59  Vitals shown include unfiled device data.  Last Pain:  Vitals:   10/06/24 0400  TempSrc: Axillary  PainSc: 0-No pain         Complications: No notable events documented.

## 2024-10-06 NOTE — Op Note (Signed)
 Indications: Jason Mclean Jason Mclean. is suffering from a chronic subdural hematoma causing significant brain compression and symptoms, prompting surgical intervention.   Findings: subdural hematoma  Preoperative Diagnosis: Subdural hematoma Postoperative Diagnosis: same   EBL: Minimal OR blood loss, high-volume subdural hematoma evacuated IVF: See anesthesia report Drains: subdural drain placed Disposition: Extubated and Stable to PACU Complications: none  A foley catheter was placed.   Preoperative Note:   Risks of surgery discussed include: infection, bleeding, stroke, coma, death, paralysis, CSF leak, weakness, need for further surgery, persistent symptoms, and the risks of anesthesia. The patient understood these risks and agreed to proceed.  Operative Note:   Procedure:  1) left frontal burr hole for drainage of subdural hematoma   Procedure: After obtaining informed consent, the patient taken to the operating room, placed in supine position, general anesthesia induced.  His head was placed on a horseshoe.  A linear incision was marked 5 cm lateral to the midline on the left on the coronal suture.  The hair was clipped.  The operative site was prepped and draped.  A timeout was performed.    The incision was injected with local.  A linear incision was made and carried to the skull.  The pericranium was reflected laterally and a small self-retaining retractor placed.  The drill was used to make a burr hole.  The dura was coagulated, then divided with a #15 blade.  A membrane was encountered and opened.    Dark brown subdural fluid was encountered, initially under very high pressure.  The subdural space was continuously irrigated until pink-tinged fluid was returning rather than higher saturation blood..  Small round JP drain was placed into the subdural space, then tunneled medially and posteriorly approximately 5 cm from the incision.  The connector was placed and secured.  Brisk  drainage was noted.  The primary incision was irrigated, then closed with vicryl and staples.  The drain was secured to the skin, then a nylon retention stitch and a 3-0 monocryl was placed for future closure of the drainage hole, and tacked to the drain with a steristrip.  Good drainage was noted to the JP drain  Sterile dressings were placed.  Sponge and pattie counts were correct at the end of the procedure.   Penne MICAEL Sharps, MD  I performed the procedure without an assistant surgeon

## 2024-10-06 NOTE — Anesthesia Postprocedure Evaluation (Signed)
 Anesthesia Post Note  Patient: [name].  Procedure(s) Performed: Left Side [employer] Evacuation of SDH (Left: Scalp)  Patient location during evaluation: PACU Anesthesia Type: General Level of consciousness: awake and alert Pain management: pain level controlled Vital Signs Assessment: post-procedure vital signs reviewed and stable Respiratory status: spontaneous breathing, nonlabored ventilation, respiratory function stable and patient connected to nasal cannula oxygen Cardiovascular status: blood pressure returned to baseline and stable Postop Assessment: no apparent nausea or vomiting Anesthetic complications: no   No notable events documented.   Last Vitals:  Vitals:   10/06/24 1600 10/06/24 1630  BP: (!) 143/71 126/72  Pulse: 81 80  Resp: 15 (!) 24  Temp: 36.8 C   SpO2: 96% 94%    Last Pain:  Vitals:   10/06/24 1600  TempSrc: Oral  PainSc: 0-No pain                 Debby Mines

## 2024-10-06 NOTE — Brief Op Note (Signed)
 10/06/2024  8:45 AM  PATIENT:  Jason Mclean Jason Mclean  78 y.o. male  PRE-OPERATIVE DIAGNOSIS:  Left Side Chronic Subdural Hematoma  POST-OPERATIVE DIAGNOSIS:  Left Side Chronic Subdural Hematoma  PROCEDURE:  Procedure(s): Left Side [facility] Evacuation of SDH (Left)  SURGEON:  Surgeons and Role:    Jason Claudene Penne LELON, MD - Primary  PHYSICIAN ASSISTANT: None  ASSISTANTS: none   ANESTHESIA:   general  EBL: High-volume of subdural hematoma evacuated, OR blood loss otherwise minimal  BLOOD ADMINISTERED:none  DRAINS: (1 left-sided) Jackson-Pratt drain(s) with closed bulb suction in the epidural subdural space   LOCAL MEDICATIONS USED:  LIDOCAINE    SPECIMEN:  No Specimen  DISPOSITION OF SPECIMEN:  N/A  COUNTS:  YES  TOURNIQUET:  * No tourniquets in log *  DICTATION: .Note written in EPIC  PLAN OF CARE: Continue in the ICU/stepdown unit.  Nonurgent repeat head CT.  Patient to lay flat at most times encouraged turns every 2-4 hours during waking hours okay to sit up for meals and restroom privileges.  Every 2 hours neuroexams until 5 PM then okay for every 4 hours, monitor drain output and record in chart.  Keppra 500 twice daily for 1 week.  Okay for DVT prophylaxis starting tomorrow morning  PATIENT DISPOSITION:  PACU - hemodynamically stable.   Delay start of Pharmacological VTE agent (>24hrs) due to surgical blood loss or risk of bleeding: no

## 2024-10-07 DIAGNOSIS — I6203 Nontraumatic chronic subdural hemorrhage: Secondary | ICD-10-CM | POA: Diagnosis not present

## 2024-10-07 DIAGNOSIS — I6201 Nontraumatic acute subdural hemorrhage: Secondary | ICD-10-CM | POA: Diagnosis not present

## 2024-10-07 LAB — GLUCOSE, CAPILLARY
Glucose-Capillary: 129 mg/dL — ABNORMAL HIGH (ref 70–99)
Glucose-Capillary: 173 mg/dL — ABNORMAL HIGH (ref 70–99)
Glucose-Capillary: 156 mg/dL — ABNORMAL HIGH (ref 70–99)
Glucose-Capillary: 266 mg/dL — ABNORMAL HIGH (ref 70–99)

## 2024-10-07 MED ORDER — BISACODYL 5 MG PO TBEC
10.0000 mg | DELAYED_RELEASE_TABLET | Freq: Every day | ORAL | Status: DC | PRN
Start: 2024-10-07 — End: 2024-10-08
  Administered 2024-10-07: 10 mg via ORAL
  Filled 2024-10-07: qty 2

## 2024-10-07 NOTE — Progress Notes (Signed)
 Per Dr. Claudene okay for pt to ambulate with supervision.

## 2024-10-07 NOTE — Plan of Care (Signed)

## 2024-10-07 NOTE — Progress Notes (Signed)
 Pt transfer to room 127.

## 2024-10-07 NOTE — Progress Notes (Signed)
 Attending Progress Note  History: Jason Barnette. is here for No surgery found, they are currently 1 Day Post-Op.   POD1: Patient doing well.  He is awake and alert.  States that his headache is better he feels like he is stronger and that his speech is more clear.  He has having intermittent left-sided jaw pain/ear pain.  Physical Exam: Vitals:   10/07/24 0700 10/07/24 0800  BP: (!) 147/70 (!) 148/78  Pulse: 77 87  Resp: (!) 22 (!) 24  Temp:  98.3 F (36.8 C)  SpO2: 95% 96%    AA Ox3 CNI  Strength: Improved pronator drift testing.  Still has slight pronation noted but no significant loss of elevation.  Able to move bilateral lower extremities to antigravity.  Data:  Recent Labs  Lab 10/05/24 0918 10/05/24 1728 10/06/24 0246  NA 139  --  139  K 4.0   < > 4.0  CL 104  --  104  CO2 25  --  22  BUN 19  --  13  CREATININE 0.94  --  0.77  GLUCOSE 122*  --  118*  CALCIUM  9.2  --  8.9   < > = values in this interval not displayed.   Recent Labs  Lab 10/05/24 0918  AST 24  ALT 12  ALKPHOS 73     Recent Labs  Lab 10/05/24 0918 10/06/24 0246  WBC 5.7 7.5  HGB 12.3* 13.4  HCT 37.4* 40.3  PLT 175 189   Recent Labs  Lab 10/05/24 0918  APTT 29  INR 0.9        CT HEAD WO CONTRAST ( ) Result Date: 10/06/2024 EXAM: CT HEAD WITHOUT CONTRAST 10/06/2024 01:15:17 PM TECHNIQUE: CT of the head was performed without the administration of intravenous contrast. Automated exposure control, iterative reconstruction, and/or weight based adjustment of the mA/kV was utilized to reduce the radiation dose to as low as reasonably achievable. COMPARISON: 10/05/2024 CLINICAL HISTORY: Status post subdural hematoma evacuation FINDINGS: BRAIN AND VENTRICLES: Small volume hemorrhage in the left sylvian fissure is new. Decreased left cerebral convexity subdural hematoma measuring 22 mm in thickness, previously 28 mm. Gas within the subdural space. 5 mm rightward midline shift, previously 9  mm. Chronic white matter microvascular ischemic changes. No evidence of acute infarct. No hydrocephalus. ORBITS: No acute abnormality. SINUSES: No acute abnormality. SOFT TISSUES AND SKULL: Interval left frontal burr hole drainage of subdural hematoma. Left frontal burr hole. No skull fracture. IMPRESSION: 1. Interval decrease in left cerebral convexity subdural hematoma thickness to 22 mm from 28 mm, with postoperative gas within the subdural space. 2. New small volume hemorrhage in the left sylvian fissure. 3. Improved rightward midline shift measuring 5 mm, previously 9 mm. 4. Left frontal burr hole. Electronically signed by: Lonni Necessary MD 10/06/2024 02:29 PM EST RP Workstation: [id number]    Other tests/results: None  Assessment/Plan:  Jason Mclean. here for a left-sided bur hole evacuation of subdural hematoma with drain placement.  He was admitted to the ICU postoperatively.  He is currently 1 Day Post-Op.   - Drains: We will continue his drain, continue to monitor its output as he starts to mobilize. -Keppra x 1 week - mobilize - pain control - DVT prophylaxis okay - PTOT - Repeat head CT 11/17 AM, order placed  Jason MICAEL Sharps, MD/MSCR Department of Neurosurgery

## 2024-10-07 NOTE — Evaluation (Signed)
 Physical Therapy Evaluation Patient Details Name: Jason Mclean. MRN: [id number] DOB: 08-04-1946 Today's Date: 10/07/2024  History of Present Illness  Pt is a 78 y/o M admitted on 10/05/24. Pt with chronic SDH & found to have expansion. Pt underwent L side burr hole evacuation of SDH on 10/05/24. PMH: CAD, DM, hypercholesterolemia, HTN, hypothyroidism, testicular CA  Clinical Impression  Pt seen for PT evaluation with pt agreeable, family present. Prior to admission pt was living alone but pt will d/c to significant other's home. Pt reports intact RLE sensation & proprioception. Pt ambulates without AD with min assist & gait pattern as noted below. Pt & family feel pt is doing much better post op. Will continue to follow pt acutely to progress mobility & balance as able.        If plan is discharge home, recommend the following: A little help with walking and/or transfers;A little help with bathing/dressing/bathroom;Assistance with cooking/housework;Assist for transportation;Help with stairs or ramp for entrance   Can travel by private vehicle        Equipment Recommendations None recommended by PT  Recommendations for Other Services       Functional Status Assessment Patient has had a recent decline in their functional status and demonstrates the ability to make significant improvements in function in a reasonable and predictable amount of time.     Precautions / Restrictions Precautions Precautions: Fall Precaution/Restrictions Comments: burr hole JP drain Restrictions Weight Bearing Restrictions Per Provider Order: No      Mobility  Bed Mobility Overal bed mobility: Modified Independent             General bed mobility comments: supine<>sit, exit L side of bed    Transfers Overall transfer level: Needs assistance Equipment used: None Transfers: Sit to/from Stand Sit to Stand: Contact guard assist                Ambulation/Gait Ambulation/Gait  assistance: Min assist Gait Distance (Feet): 200 Feet Assistive device: None Gait Pattern/deviations: Decreased dorsiflexion - right Gait velocity: slightly decreased     General Gait Details: slightly decreased heel strike RLE with cuing to correct & fair return demo, no overt LOB  Stairs            Wheelchair Mobility     Tilt Bed    Modified Rankin (Stroke Patients Only)       Balance Overall balance assessment: Needs assistance Sitting-balance support: Feet supported Sitting balance-Leahy Scale: Good     Standing balance support: During functional activity, No upper extremity supported Standing balance-Leahy Scale: Fair                               Pertinent Vitals/Pain Pain Assessment Pain Assessment: No/denies pain    Home Living Family/patient expects to be discharged to:: Private residence Living Arrangements: Spouse/significant other Available Help at Discharge: Family;Available 24 hours/day Type of Home: House Home Access: Stairs to enter   [employer] of Steps: 1   Home Layout: Able to live on main level with bedroom/bathroom;Two level Home Equipment: Shower seat - built [employer] (2 wheels) Additional Comments: Pt plans to d/c to significant other's house, information above for her home.    Prior Function               Mobility Comments: Independent without AD, driving, 1 fall in the past year.       Extremity/Trunk Assessment   Upper  Extremity Assessment Upper Extremity Assessment: Overall WFL for tasks assessed    Lower Extremity Assessment Lower Extremity Assessment: Overall WFL for tasks assessed (sensation (to light touch) & proprioception intact RLE)       Communication   Communication Communication: Impaired Factors Affecting Communication: Hearing impaired    Cognition Arousal: Alert Behavior During Therapy: WFL for tasks assessed/performed   PT - Cognitive impairments: Difficult  to assess Difficult to assess due to: Hard of hearing/deaf                     PT - Cognition Comments: pt introduces himself again mid session, does not recall introducing himself at beginning of session but family reports cognition is baselien Following commands: Impaired Following commands impaired: Follows multi-step commands with increased time     Cueing Cueing Techniques: Verbal cues     General Comments General comments (skin integrity, edema, etc.): VSS throughout session    Exercises     Assessment/Plan    PT Assessment Patient needs continued PT services  PT Problem List Decreased strength;Decreased activity tolerance;Decreased balance;Decreased mobility;Decreased safety awareness;Decreased knowledge of use of DME       PT Treatment Interventions DME instruction;Balance training;Gait training;Neuromuscular re-education;Stair training;Functional mobility training;Patient/family education;Therapeutic activities;Therapeutic exercise;Manual techniques    PT Goals (Current goals can be found in the Care Plan section)  Acute Rehab PT Goals Patient Stated Goal: get better PT Goal Formulation: With patient Time For Goal Achievement: 10/21/24 Potential to Achieve Goals: Good    Frequency Min 2X/week     Co-evaluation               AM-PAC PT 6 Clicks Mobility  Outcome Measure Help needed turning from your back to your side while in a flat bed without using bedrails?: None Help needed moving from lying on your back to sitting on the side of a flat bed without using bedrails?: None Help needed moving to and from a bed to a chair (including a wheelchair)?: A Little Help needed standing up from a chair using your arms (e.g., wheelchair or bedside chair)?: A Little Help needed to walk in hospital room?: A Little Help needed climbing 3-5 steps with a railing? : A Little 6 Click Score: 20    End of Session   Activity Tolerance: Patient tolerated treatment  well Patient left: in bed;with call bell/phone within reach;with family/visitor present Nurse Communication: Mobility status PT Visit Diagnosis: Other abnormalities of gait and mobility (R26.89);Difficulty in walking, not elsewhere classified (R26.2)    Time: 8657-8640 PT Time Calculation (min) (ACUTE ONLY): 17 min   Charges:   PT Evaluation $PT Eval Moderate Complexity: 1 Mod   PT General Charges $$ ACUTE PT VISIT: 1 Visit         Richerd Pinal, PT, DPT 10/07/24, 2:30 PM   Richerd CHRISTELLA Pinal 10/07/2024, 2:29 PM

## 2024-10-07 NOTE — Progress Notes (Signed)
 PROGRESS NOTE    Jason Mclean.   FMW:[id number] DOB: 04/24/1946  DOA: 10/05/2024 Date of Service: 10/07/24 which is hospital day 2  PCP: Sadie Manna, MD    Hospital course / significant events:   Jason Mclean. is a 78 y.o. male with medical history significant for hypertension, hyperlipidemia, hypothyroidism, non-insulin -dependent type 2 diabetes and a known chronic subdural hematoma after fall 6 mos ago. (+)confusion, weakness over past week or so. On ASA. He had an outpatient CT scan done by neurosurgery Dr. Clois, showing expansion of his chronic subdural. Sent to ED>   11/14: admitted to hospitalist 11/15: burr hole L frontal. Stepdown unit close monitoring. Pt reports improvement  11/16: remains stable, transfer off SDU, plan repeat CT head tomorrow    Consultants:  Neurosurgery   Procedures/Surgeries: 10/06/24 left frontal burr hole for drainage of subdural hematoma w/ Dr Claudene       ASSESSMENT & PLAN:   Acute on chronic subdural hematoma-in the setting of known subdural/hygroma S/p left frontal burr hole for drainage of subdural hematoma w/ Dr Claudene performed 10/06/24  Neurosurgery following Hold ASA Goals SBP <160 see below Keppra 500 mg po q12h x1 week Serial CT next due 11/17 morning Continue drain   HTN Goal SBP <160  Home metoprolol  tartrate 37.5 mg bid  IV hydralazine  10 mg q6h prn SBP >160   Type 2 diabetes Carb diet SSI achs  Hold home glimepiride , metformin , Rybelsus   GERD Pantoprazole  40 mg po daily    Hyperlipidemia Ezetimibe  10 mg daily  Hypothryoid Synthroid     Overweight/Class 1 obesity based on BMI: Body mass index is 29.24 kg/m.SABRA Significantly low or high BMI is associated with higher medical risk.  Underweight - under 18  overweight - 25 to 29 obese - 30 or more Class 1 obesity: BMI of 30.0 to 34 Class 2 obesity: BMI of 35.0 to 39 Class 3 obesity: BMI of 40.0 to 49 Super Morbid Obesity: BMI  50-59 Super-super Morbid Obesity: BMI 60+ Healthy nutrition and physical activity advised as adjunct to other disease management and risk reduction treatments    DVT prophylaxis: SCD, resume Rx ppx tomorrow  IV fluids: no continuous IV fluids  Nutrition: carb modified diet Central lines / other devices: drain in L frontal burr hole  Code Status: FULL CODE ACP documentation reviewed:  none on file in [facility]  TOC needs: outpatient PT  Medical barriers to dispo: neuro monitoring following surgery. Expected medical readiness for discharge per neurosurgery             Subjective / Brief ROS:  Patient reports feeling better today Denies CP/SOB.  Pain controlled.  Denies new weakness.  Tolerating diet.    Family Communication: support person at bedside on rounds     Objective Findings:  Vitals:   10/07/24 1100 10/07/24 1200 10/07/24 1300 10/07/24 1422  BP: (!) 113/58 137/63 138/74 (!) 126/58  Pulse: 66 72 66 74  Resp: 13 14 13 18   Temp:  98.6 F (37 C)  (!) 97.4 F (36.3 C)  TempSrc:  Oral  Oral  SpO2: 93% 97% 96% 97%  Weight:      Height:        Intake/Output Summary (Last 24 hours) at 10/07/2024 1649 Last data filed at 10/07/2024 1127 Gross per 24 hour  Intake 480 ml  Output 706 ml  Net -226 ml   Filed Weights   10/05/24 0908 10/05/24 1030  Weight: 89.8 kg 89.8  kg    Examination:  Physical Exam Constitutional:      General: He is not in acute distress. Cardiovascular:     Rate and Rhythm: Normal rate and regular rhythm.  Pulmonary:     Effort: Pulmonary effort is normal.  Neurological:     Mental Status: He is alert and oriented to person, place, and time.  Psychiatric:        Mood and Affect: Mood normal.        Behavior: Behavior normal.          Scheduled Medications:   Chlorhexidine Gluconate Cloth  6 each Topical Q0600   ezetimibe   10 mg Oral Daily   insulin  aspart  0-15 Units Subcutaneous TID WC   insulin  aspart  0-5 Units  Subcutaneous QHS   levETIRAcetam  500 mg Oral BID   levothyroxine   75 mcg Oral QAC breakfast   metoprolol  tartrate  37.5 mg Oral BID   pantoprazole   40 mg Oral Daily    Continuous Infusions:   PRN Medications:  acetaminophen  **OR** acetaminophen , albuterol , bisacodyl, hydrALAZINE , labetalol , ondansetron  **OR** ondansetron  (ZOFRAN ) IV, traZODone  Antimicrobials from admission:  Anti-infectives (From admission, onward)    None           Data Reviewed:  I have personally reviewed the following...  CBC: Recent Labs  Lab 10/05/24 0918 10/06/24 0246  WBC 5.7 7.5  NEUTROABS 3.1  --   HGB 12.3* 13.4  HCT 37.4* 40.3  MCV 90.1 88.0  PLT 175 189   Basic Metabolic Panel: Recent Labs  Lab 10/05/24 0918 10/05/24 1728 10/06/24 0246  NA 139  --  139  K 4.0 4.3 4.0  CL 104  --  104  CO2 25  --  22  GLUCOSE 122*  --  118*  BUN 19  --  13  CREATININE 0.94  --  0.77  CALCIUM  9.2  --  8.9  MG  --  1.8  --    GFR: Estimated Creatinine Clearance: 87 mL/min (by C-G formula based on SCr of 0.77 mg/dL). Liver Function Tests: Recent Labs  Lab 10/05/24 0918  AST 24  ALT 12  ALKPHOS 73  BILITOT 0.7  PROT 6.8  ALBUMIN 3.9   No results for input(s): LIPASE, AMYLASE in the last 168 hours. No results for input(s): AMMONIA in the last 168 hours. Coagulation Profile: Recent Labs  Lab 10/05/24 0918  INR 0.9   Cardiac Enzymes: No results for input(s): CKTOTAL, CKMB, CKMBINDEX, TROPONINI in the last 168 hours. BNP (last 3 results) No results for input(s): PROBNP in the last 8760 hours. HbA1C: Recent Labs    10/05/24 0918  HGBA1C 7.1*   CBG: Recent Labs  Lab 10/06/24 1110 10/06/24 1651 10/06/24 2154 10/07/24 0744 10/07/24 1112  GLUCAP 222* 283* 220* 129* 173*   Lipid Profile: No results for input(s): CHOL, HDL, LDLCALC, TRIG, CHOLHDL, LDLDIRECT in the last 72 hours. Thyroid  Function Tests: No results for input(s): TSH,  T4TOTAL, FREET4, T3FREE, THYROIDAB in the last 72 hours. Anemia Panel: No results for input(s): VITAMINB12, FOLATE, FERRITIN, TIBC, IRON, RETICCTPCT in the last 72 hours. Most Recent Urinalysis On File:     Component Value Date/Time   COLORURINE YELLOW (A) 10/05/2024 1048   APPEARANCEUR CLEAR (A) 10/05/2024 1048   LABSPEC 1.019 10/05/2024 1048   PHURINE 6.0 10/05/2024 1048   GLUCOSEU NEGATIVE 10/05/2024 1048   HGBUR NEGATIVE 10/05/2024 1048   BILIRUBINUR NEGATIVE 10/05/2024 1048   KETONESUR NEGATIVE 10/05/2024 1048  PROTEINUR NEGATIVE 10/05/2024 1048   NITRITE NEGATIVE 10/05/2024 1048   LEUKOCYTESUR NEGATIVE 10/05/2024 1048   Sepsis Labs: @LABRCNTIP (procalcitonin:4,lacticidven:4) Microbiology: Recent Results (from the past 240 hours)  MRSA Next Gen by PCR, Nasal     Status: None   Collection Time: 10/05/24 10:37 AM   Specimen: Urine, Clean Catch; Nasal Swab  Result Value Ref Range Status   MRSA by PCR Next Gen NOT DETECTED NOT DETECTED Final    Comment: (NOTE) The GeneXpert MRSA Assay (FDA approved for NASAL specimens only), is one component of a comprehensive MRSA colonization surveillance program. It is not intended to diagnose MRSA infection nor to guide or monitor treatment for MRSA infections. Test performance is not FDA approved in patients less than 59 years old. Performed at [facility], [address], [place], [place] [address]       Radiology Studies last 3 days: CT HEAD WO CONTRAST ( ) Result Date: 10/06/2024 EXAM: CT HEAD WITHOUT CONTRAST 10/06/2024 01:15:17 PM TECHNIQUE: CT of the head was performed without the administration of intravenous contrast. Automated exposure control, iterative reconstruction, and/or weight based adjustment of the mA/kV was utilized to reduce the radiation dose to as low as reasonably achievable. COMPARISON: 10/05/2024 CLINICAL HISTORY: Status post subdural hematoma evacuation FINDINGS: BRAIN AND  VENTRICLES: Small volume hemorrhage in the left sylvian fissure is new. Decreased left cerebral convexity subdural hematoma measuring 22 mm in thickness, previously 28 mm. Gas within the subdural space. 5 mm rightward midline shift, previously 9 mm. Chronic white matter microvascular ischemic changes. No evidence of acute infarct. No hydrocephalus. ORBITS: No acute abnormality. SINUSES: No acute abnormality. SOFT TISSUES AND SKULL: Interval left frontal burr hole drainage of subdural hematoma. Left frontal burr hole. No skull fracture. IMPRESSION: 1. Interval decrease in left cerebral convexity subdural hematoma thickness to 22 mm from 28 mm, with postoperative gas within the subdural space. 2. New small volume hemorrhage in the left sylvian fissure. 3. Improved rightward midline shift measuring 5 mm, previously 9 mm. 4. Left frontal burr hole. Electronically signed by: Lonni Necessary MD 10/06/2024 02:29 PM EST RP Workstation: [id number]   CT HEAD WO CONTRAST ( ) Result Date: 10/05/2024 EXAM: CT HEAD WITHOUT CONTRAST 10/05/2024 08:45:36 AM TECHNIQUE: CT of the head was performed without the administration of intravenous contrast. Automated exposure control, iterative reconstruction, and/or weight based adjustment of the mA/kV was utilized to reduce the radiation dose to as low as reasonably achievable. COMPARISON: 08/10/2024 CLINICAL HISTORY: Subdural fluid collection FINDINGS: BRAIN AND VENTRICLES: Redemonstrated large extra-axial collection along the left cerebral convexity, measures up to 2.8 cm in thickness on coronal images series 4 image 28. This collection previously measured approximately 10 mm in thickness. On oblique axial images, the collection measures up to 2.7 cm in thickness, previously measuring approximately 9 mm. There are layering hyperattenuating blood products posteriorly compatible with acute hemorrhage with additional areas of hypo to isoattenuating blood products anteriorly,  findings suggestive of acute on chronic subdural hematoma. There is significant interval increase in mass effect on the left cerebral hemisphere with sulcal effacement throughout the left cerebral hemisphere. There is 9 mm rightward midline shift at the level of the lateral ventricles, previously 2 mm. There is increased effacement of the left lateral ventricle. No hydrocephalus. No acute hemorrhage. No evidence of parenchymal hemorrhage. No evidence of intraventricular hemorrhage. No evidence of acute infarct. Similar appearance of chronic microvascular ischemic changes. ORBITS: No acute abnormality. SINUSES: Mild mucosal thickening in ethmoid air cells. SOFT TISSUES AND SKULL: No  acute soft tissue abnormality. No skull fracture. IMPRESSION: 1. Significant interval enlargement of acute on chronic left cerebral convexity subdural hematoma, now up to 2.8 cm. 2. Associated marked interval increase in mass effect with left hemispheric sulcal effacement and 9 mm rightward midline shift. 3. Chronic microvascular ischemic changes, similar to prior. Electronically signed by: Donnice Mania MD 10/05/2024 09:00 AM EST RP Workstation: [id number]          Laneta Blunt, DO [facility] 10/07/2024, 4:49 PM    Dictation software may have been used to generate the above note. Typos may occur and escape review in typed/dictated notes. Please contact Dr Blunt directly for clarity if needed.  Staff may message me via secure chat in Epic  but this may not receive an immediate response,  please page me for urgent matters!  If 7PM-7AM, please contact night coverage [email or web address]

## 2024-10-08 ENCOUNTER — Inpatient Hospital Stay

## 2024-10-08 ENCOUNTER — Encounter: Payer: Self-pay | Admitting: Neurosurgery

## 2024-10-08 DIAGNOSIS — I6203 Nontraumatic chronic subdural hemorrhage: Secondary | ICD-10-CM | POA: Diagnosis not present

## 2024-10-08 DIAGNOSIS — I6201 Nontraumatic acute subdural hemorrhage: Secondary | ICD-10-CM | POA: Diagnosis not present

## 2024-10-08 DIAGNOSIS — I6200 Nontraumatic subdural hemorrhage, unspecified: Secondary | ICD-10-CM | POA: Diagnosis not present

## 2024-10-08 DIAGNOSIS — G9389 Other specified disorders of brain: Secondary | ICD-10-CM | POA: Diagnosis not present

## 2024-10-08 LAB — GLUCOSE, CAPILLARY
Glucose-Capillary: 154 mg/dL — ABNORMAL HIGH (ref 70–99)
Glucose-Capillary: 164 mg/dL — ABNORMAL HIGH (ref 70–99)

## 2024-10-08 MED ORDER — LEVETIRACETAM ER 500 MG PO TB24: 500.0000 mg | ORAL_TABLET | Freq: Two times a day (BID) | ORAL | 0 refills | Status: DC

## 2024-10-08 NOTE — Evaluation (Signed)
 Occupational Therapy Evaluation Patient Details Name: Jason Mclean. MRN: [id number] DOB: Dec 01, 1945 Today's Date: 10/08/2024   History of Present Illness   Pt is a 78 y/o M admitted on 10/05/24. Pt with chronic SDH & found to have expansion. Pt underwent L side burr hole evacuation of SDH on 10/05/24. PMH: CAD, DM, hypercholesterolemia, HTN, hypothyroidism, testicular CA     Clinical Impressions Pt was seen for OT evaluation this date. PTA, he lived in a 2 level home with his son and reports being IND at baseline without use of AD. He plans to move in with his girlfriend upon DC to her 2 level home with level entry and ability to live on ground level. Pt presents with deficits in balance and activity tolerance, affecting safe and optimal ADL completion. Pt currently requires MOD I for bed mobility, CGA for STS and Min/CGA for ~20 ft ambulation within room and simulated toilet transfer. Anticipate Min/CGA for LB ADL management.  Pt would benefit from skilled OT services to address noted impairments and functional limitations to maximize safety and independence while minimizing future risk of falls, injury, and readmission. Do not anticipate the need for follow up OT services upon acute hospital DC.      If plan is discharge home, recommend the following:   A little help with walking and/or transfers;Assistance with cooking/housework;Help with stairs or ramp for entrance;Assist for transportation     Functional Status Assessment   Patient has had a recent decline in their functional status and demonstrates the ability to make significant improvements in function in a reasonable and predictable amount of time.     Equipment Recommendations   None recommended by OT     Recommendations for Other Services         Precautions/Restrictions   Precautions Precautions: Fall Precaution/Restrictions Comments: burr hole JP drain Restrictions Weight Bearing Restrictions Per  Provider Order: No     Mobility Bed Mobility Overal bed mobility: Modified Independent                  Transfers Overall transfer level: Needs assistance Equipment used: None Transfers: Sit to/from Stand Sit to Stand: Contact guard assist           General transfer comment: stood from EOB without assist CGA/SBA, ambulated with Min/CGA d/t x1 LOB through narrow walkway ~20 ft      Balance Overall balance assessment: Needs assistance Sitting-balance support: Feet supported Sitting balance-Leahy Scale: Good     Standing balance support: During functional activity, No upper extremity supported Standing balance-Leahy Scale: Fair                             ADL either performed or assessed with clinical judgement   ADL Overall ADL's : Needs assistance/impaired Eating/Feeding: Set up;Sitting Eating/Feeding Details (indicate cue type and reason): able to set up his entire breakfsat tray and feed self once seated in recliner                 Lower Body Dressing: Minimal assistance;Contact guard assist;Sitting/lateral leans;Sit to/from stand Lower Body Dressing Details (indicate cue type and reason): anticipate Toilet Transfer: Contact guard assist;Stand-pivot Toilet Transfer Details (indicate cue type and reason): simulated to recliner         Functional mobility during ADLs: Minimal assistance;Contact guard assist       Vision         Perception  Praxis         Pertinent Vitals/Pain Pain Assessment Pain Assessment: 0-10 Pain Score: 2  Pain Location: headache Pain Descriptors / Indicators: Aching, Headache Pain Intervention(s): Monitored during session, Repositioned, Limited activity within patient's tolerance     Extremity/Trunk Assessment Upper Extremity Assessment Upper Extremity Assessment: Overall WFL for tasks assessed   Lower Extremity Assessment Lower Extremity Assessment: Overall WFL for tasks assessed        Communication Communication Communication: Impaired Factors Affecting Communication: Hearing impaired   Cognition Arousal: Alert Behavior During Therapy: WFL for tasks assessed/performed                                 Following commands: Impaired Following commands impaired: Follows multi-step commands with increased time     Cueing  General Comments   Cueing Techniques: Verbal cues      Exercises Other Exercises Other Exercises: Edu on role of OT in acute setting.   Shoulder Instructions      Home Living Family/patient expects to be discharged to:: Private residence Living Arrangements: Spouse/significant other Available Help at Discharge: Family;Available 24 hours/day Type of Home: House Home Access: Stairs to enter [employer] of Steps: 1   Home Layout: Able to live on main level with bedroom/bathroom;Two level     Bathroom Shower/Tub: Runner, Broadcasting/film/video: Shower seat - built [employer] (2 wheels)   Additional Comments: Pt plans to d/c to significant other's house, information above for her home.      Prior Functioning/Environment               Mobility Comments: Independent without AD, driving, 1 fall in the past year. ADLs Comments: Indep ADL/IADL    OT Problem List: Decreased strength;Decreased activity tolerance;Impaired balance (sitting and/or standing)   OT Treatment/Interventions: Self-care/ADL training;Therapeutic exercise;Therapeutic activities;Patient/family education;Energy conservation;Balance training      OT Goals(Current goals can be found in the care plan section)   Acute Rehab OT Goals Patient Stated Goal: go home OT Goal Formulation: With patient Time For Goal Achievement: 10/22/24 Potential to Achieve Goals: Good ADL Goals Pt Will Perform Lower Body Dressing: with modified independence;sit to/from stand;sitting/lateral leans Pt Will Transfer to Toilet: with modified  independence;Independently;ambulating Additional ADL Goal #1: Pt will demo implementation of 1 learned ECS during ADL performance to maximize safety/IND on return home.   OT Frequency:  Min 2X/week    Co-evaluation              AM-PAC OT 6 Clicks Daily Activity     Outcome Measure Help from another person eating meals?: None Help from another person taking care of personal grooming?: None Help from another person toileting, which includes using toliet, bedpan, or urinal?: A Little Help from another person bathing (including washing, rinsing, drying)?: A Little Help from another person to put on and taking off regular upper body clothing?: None Help from another person to put on and taking off regular lower body clothing?: A Little 6 Click Score: 21   End of Session    Activity Tolerance: Patient tolerated treatment well Patient left: in chair;with call bell/phone within reach;with chair alarm set  OT Visit Diagnosis: Other abnormalities of gait and mobility (R26.89);Unsteadiness on feet (R26.81)                Time: 9092-9077 OT Time Calculation (min): 15 min Charges:  OT  General Charges $OT Visit: 1 Visit OT Evaluation $OT Eval Low Complexity: 1 Low Payson Crumby, OTR/L 10/08/24, 9:36 AM  Duwaine FORBES Saupe 10/08/2024, 9:34 AM

## 2024-10-08 NOTE — Progress Notes (Signed)
 Physical Therapy Treatment Patient Details Name: Jason Mclean. MRN: [id number] DOB: [birth year] Today's Date: 10/08/2024   History of Present Illness Pt is a 78 y/o M admitted on 10/05/24. Pt with chronic SDH & found to have expansion. Pt underwent L side burr hole evacuation of SDH on 10/05/24. PMH: CAD, DM, hypercholesterolemia, HTN, hypothyroidism, testicular CA    PT Comments  Pt seen prior to lunch, spouse at bedside. Pt endorses improved symptoms, currently w/o HA or difficulty with LE motor control. VSS throughout session, JP drain intact. Pt completed gait training in hall with CGA for safety. No LOB with changes in direction or head turns, appears to be improving. Recommended using SPC at d/c for safety on uneven surfaces. Will continue to progress acutely. Out-pt PT remains appropriate.    If plan is discharge home, recommend the following: A little help with walking and/or transfers;A little help with bathing/dressing/bathroom;Assistance with cooking/housework;Assist for transportation;Help with stairs or ramp for entrance   Can travel by private vehicle        Equipment Recommendations  None recommended by PT    Recommendations for Other Services       Precautions / Restrictions Precautions Precautions: Fall Recall of Precautions/Restrictions: Intact Precaution/Restrictions Comments: burr hole JP drain Restrictions Weight Bearing Restrictions Per Provider Order: No     Mobility  Bed Mobility Overal bed mobility: Modified Independent             General bed mobility comments: supine<>sit, exit L side of bed    Transfers Overall transfer level: Needs assistance Equipment used: None Transfers: Sit to/from Stand Sit to Stand: Contact guard assist           General transfer comment:  (Steady with wide BOS and CGA)    Ambulation/Gait Ambulation/Gait assistance: Contact guard assist Gait Distance (Feet): 150 Feet Assistive device: None Gait  Pattern/deviations: Step-through pattern, Decreased stride length, Drifts right/left, Wide base of support Gait velocity: slightly decreased     General Gait Details:  (Equal heel strike today, no LOB with multiple changes in directions and head turns)   Stairs             Wheelchair Mobility     Tilt Bed    Modified Rankin (Stroke Patients Only)       Balance Overall balance assessment: Needs assistance Sitting-balance support: Feet supported Sitting balance-Leahy Scale: Good     Standing balance support: During functional activity, No upper extremity supported Standing balance-Leahy Scale: Fair Standing balance comment:  (Pt encouraged to use a cane on outdoor surfaces initially)                            Communication Communication Communication: Impaired Factors Affecting Communication: Hearing impaired  Cognition Arousal: Alert Behavior During Therapy: WFL for tasks assessed/performed                           PT - Cognition Comments:  (Clearing cognitively) Following commands: Impaired Following commands impaired: Follows multi-step commands with increased time    Cueing Cueing Techniques: Verbal cues  Exercises Other Exercises Other Exercises: Edu on role of PT in acute setting.    General Comments General comments (skin integrity, edema, etc.): [facility] with JP drain in place and secured      Pertinent Vitals/Pain Pain Assessment Pain Assessment: No/denies pain    Home Living  Prior Function            PT Goals (current goals can now be found in the care plan section) Acute Rehab PT Goals Patient Stated Goal: get better Progress towards PT goals: Progressing toward goals    Frequency    Min 2X/week      PT Plan      Co-evaluation              AM-PAC PT 6 Clicks Mobility   Outcome Measure  Help needed turning from your back to your side while in a flat bed  without using bedrails?: None Help needed moving from lying on your back to sitting on the side of a flat bed without using bedrails?: None Help needed moving to and from a bed to a chair (including a wheelchair)?: A Little Help needed standing up from a chair using your arms (e.g., wheelchair or bedside chair)?: A Little Help needed to walk in hospital room?: A Little Help needed climbing 3-5 steps with a railing? : A Little 6 Click Score: 20    End of Session Equipment Utilized During Treatment: Gait belt Activity Tolerance: Patient tolerated treatment well Patient left: in chair;with call bell/phone within reach;with chair alarm set;with family/visitor present Nurse Communication: Mobility status PT Visit Diagnosis: Other abnormalities of gait and mobility (R26.89);Difficulty in walking, not elsewhere classified (R26.2)     Time: 1202-1222 PT Time Calculation (min) (ACUTE ONLY): 20 min  Charges:    $Therapeutic Activity: 8-22 mins PT General Charges $$ ACUTE PT VISIT: 1 Visit                    Darice Bohr, PTA  Darice JAYSON Bohr 10/08/2024, 1:44 PM

## 2024-10-08 NOTE — TOC Initial Note (Signed)
 Transition of Care (TOC) - Initial/Assessment Note    Patient Details  Name: Jason Mclean. MRN: [id number] Date of Birth: 02/16/1946  Transition of Care [facility]) CM/SW Contact:    Victory Jackquline RAMAN, RN Phone Number: 10/08/2024, 6:01 PM  Clinical Narrative:    RNCM met with patient at bedside. RNCM introduced role and explained that discharge planning would be discussed. PT is recommending HH/PT. Patient declined HH/PT states he doesn't feel like he needs it. He lives alone in a single family home with a child psychotherapist on the first floor and one on the second floor. He has a girl friend that has a house with the same set up and he is planning to go stay with her when he's discharged for a few weeks and she will help him. He has a walker at home. His girlfriend will be picking him up at the time of discharge. Outpatient PT list left with the patient in case he changes his mind.  RNCM will continue to follow for discharge planning needs.            Expected Discharge Plan: Home/Self Care Barriers to Discharge: Continued Medical Work up   Patient Goals and CMS Choice            Expected Discharge Plan and Services       Living arrangements for the past 2 months: Single Family Home Expected Discharge Date: 10/08/24                                    Prior Living Arrangements/Services Living arrangements for the past 2 months: Single Family Home Lives with:: Self Patient language and need for interpreter reviewed:: Yes Do you feel safe going back to the place where you live?: Yes      Need for Family Participation in Patient Care: Yes (Comment) Care giver support system in place?: Yes (comment) Current home services: DME Criminal Activity/Legal Involvement Pertinent to Current Situation/Hospitalization: No - Comment as needed  Activities of Daily Living   ADL Screening (condition at time of admission) Independently performs ADLs?: Yes (appropriate for developmental age) Is the  patient deaf or have difficulty hearing?: Yes Does the patient have difficulty seeing, even when wearing glasses/contacts?: Yes Does the patient have difficulty concentrating, remembering, or making decisions?: Yes  Permission Sought/Granted                  Emotional Assessment Appearance:: Appears stated age, Well-Groomed Attitude/Demeanor/Rapport: Self-Confident, Engaged, Gracious Affect (typically observed): Accepting, Calm, Happy, Pleasant, Quiet Orientation: : Oriented to Self, Oriented to Place, Oriented to  Time, Oriented to Situation Alcohol  / Substance Use: Not Applicable Psych Involvement: No (comment)  Admission diagnosis:  Subdural hematoma (HCC) [S06.5XAA] Weakness [R53.1] Acute expansion of chronic intracranial subdural hematoma (HCC) [I62.01, I62.03] Patient Active Problem List   Diagnosis Date Noted   Weakness 10/06/2024   Subdural hematoma (HCC) 10/06/2024   Acute expansion of chronic intracranial subdural hematoma (HCC) 10/05/2024   Syncope 06/14/2024   Subdural fluid collection 06/14/2024   Hypertension 03/03/2024   Hyperlipidemia 03/03/2024   Hypothyroidism 03/03/2024   Coronary artery disease 03/01/2024   CAD (coronary artery disease) 02/29/2024   Uncontrolled type 2 diabetes mellitus with hyperglycemia, without long-term current use of insulin  (HCC) 01/22/2024   CAD S/P percutaneous coronary angioplasty 01/22/2024   Chronic obstructive pulmonary disease (COPD) (HCC) 01/22/2024   Hypertensive urgency 01/22/2024   Unstable angina (  HCC) 01/29/2020   NSTEMI (non-ST elevated myocardial infarction) (HCC) 06/15/2018   PCP:  Sadie Manna, MD Pharmacy:   [facility] [phone] [address], [place] - [address] [place] [address] Phone: [phone] Fax: [phone]     Social Drivers of Health (SDOH) Social History: SDOH Screenings   Food Insecurity: No Food Insecurity (10/05/2024)  Housing: Low Risk  (10/05/2024)   Transportation Needs: No Transportation Needs (10/05/2024)  Utilities: Not At Risk (10/05/2024)  Financial Resource Strain: Low Risk  (06/25/2024)   Received from [facility]  Social Connections: Moderately Isolated (10/05/2024)  Tobacco Use: Medium Risk (10/05/2024)   SDOH Interventions: Food Insecurity Interventions: Intervention Not Indicated Housing Interventions: Intervention Not Indicated Transportation Interventions: Intervention Not Indicated Utilities Interventions: Intervention Not Indicated Social Connections Interventions: Intervention Not Indicated   Readmission Risk Interventions     No data to display

## 2024-10-08 NOTE — Progress Notes (Signed)
 Attending Progress Note  History: Jason Mclean. is here for No surgery found, they are currently 2 Days Post-Op.   POD2: Pt reporting generalized fatigue this morning but denies any significant headache POD1: Patient doing well.  He is awake and alert.  States that his headache is better he feels like he is stronger and that his speech is more clear.  He has having intermittent left-sided jaw pain/ear pain.  Physical Exam: Vitals:   10/07/24 2217 10/08/24 0523  BP: 137/65 (!) 140/72  Pulse: 78 72  Resp: 18 18  Temp: 97.6 F (36.4 C) 98.6 F (37 C)  SpO2: 99% 97%    AA Ox3 CNI  Strength:   Incision: with dressing stapled in place   JP drain: 5 yesterday. Nothing recorded overnight. RN at bedside stating drainage was scant over night shift.   Data:  Recent Labs  Lab 10/05/24 0918 10/05/24 1728 10/06/24 0246  NA 139  --  139  K 4.0   < > 4.0  CL 104  --  104  CO2 25  --  22  BUN 19  --  13  CREATININE 0.94  --  0.77  GLUCOSE 122*  --  118*  CALCIUM  9.2  --  8.9   < > = values in this interval not displayed.   Recent Labs  Lab 10/05/24 0918  AST 24  ALT 12  ALKPHOS 73     Recent Labs  Lab 10/05/24 0918 10/06/24 0246  WBC 5.7 7.5  HGB 12.3* 13.4  HCT 37.4* 40.3  PLT 175 189   Recent Labs  Lab 10/05/24 0918  APTT 29  INR 0.9        CT HEAD WO CONTRAST ( ) Result Date: 10/06/2024 EXAM: CT HEAD WITHOUT CONTRAST 10/06/2024 01:15:17 PM TECHNIQUE: CT of the head was performed without the administration of intravenous contrast. Automated exposure control, iterative reconstruction, and/or weight based adjustment of the mA/kV was utilized to reduce the radiation dose to as low as reasonably achievable. COMPARISON: 10/05/2024 CLINICAL HISTORY: Status post subdural hematoma evacuation FINDINGS: BRAIN AND VENTRICLES: Small volume hemorrhage in the left sylvian fissure is new. Decreased left cerebral convexity subdural hematoma measuring 22 mm in thickness,  previously 28 mm. Gas within the subdural space. 5 mm rightward midline shift, previously 9 mm. Chronic white matter microvascular ischemic changes. No evidence of acute infarct. No hydrocephalus. ORBITS: No acute abnormality. SINUSES: No acute abnormality. SOFT TISSUES AND SKULL: Interval left frontal burr hole drainage of subdural hematoma. Left frontal burr hole. No skull fracture. IMPRESSION: 1. Interval decrease in left cerebral convexity subdural hematoma thickness to 22 mm from 28 mm, with postoperative gas within the subdural space. 2. New small volume hemorrhage in the left sylvian fissure. 3. Improved rightward midline shift measuring 5 mm, previously 9 mm. 4. Left frontal burr hole. Electronically signed by: Lonni Necessary MD 10/06/2024 02:29 PM EST RP Workstation: [id number]    Other tests/results: None  Assessment/Plan:  Jason Mclean. here for a left-sided bur hole evacuation of subdural hematoma with drain placement.  He was admitted to the ICU postoperatively.  He is currently 2 Days Post-Op.   - Drains: We will continue his drain and re-evaluate removal after CT scan. -Keppra x 1 week (until 11/23) - mobilize - pain control - DVT prophylaxis okay - PTOT - Repeat head CT 11/17 AM, pending   Edsel Goods PA-C Department of Neurosurgery

## 2024-10-08 NOTE — Discharge Summary (Addendum)
 Physician Discharge Summary   Patient: Jason Mclean. MRN: [id number]  DOB: September 24, 1946   Admit:     Date of Admission: 10/05/2024 Admitted from: home   Discharge: Date of discharge: 10/08/24 Disposition: Home Condition at discharge: good  CODE STATUS: FULL CODE     Discharge Physician: Laneta Blunt, DO [facility]     PCP: Sadie Manna, MD  Recommendations for Outpatient Follow-up:  Follow up with PCP Sadie Manna, MD in 1-2 weeks FOllow up as directed w/ neurosurgery   Discharge Instructions     Diet Carb Modified   Complete by: As directed    Discharge wound care:   Complete by: As directed    See instructions per neurosurgeon   Increase activity slowly   Complete by: As directed          Discharge Diagnoses: Principal Problem:   Acute expansion of chronic intracranial subdural hematoma (HCC) Active Problems:   Weakness   Subdural hematoma [facility])       Hospital Course: Hospital course / significant events:   Osiel Din Mclean. is a 78 y.o. male with medical history significant for hypertension, hyperlipidemia, hypothyroidism, non-insulin -dependent type 2 diabetes and a known chronic subdural hematoma after fall 6 mos ago. (+)confusion, weakness over past week or so. On ASA. He had an outpatient CT scan done by neurosurgery Dr. Clois, showing expansion of his chronic subdural. Sent to ED>   11/14: admitted to hospitalist 11/15: burr hole L frontal. Stepdown unit close monitoring. Pt reports improvement  11/16: remains stable, transfer off SDU, plan repeat CT head tomorrow  11/17: CT head stable, drain removed, per neurosurgery ok to dc on keppra and close outpatient f/u    Consultants:  Neurosurgery   Procedures/Surgeries: 10/06/24 left frontal burr hole for drainage of subdural hematoma w/ Dr Claudene       ASSESSMENT & PLAN:   Acute on chronic subdural hematoma-in the setting of known subdural/hygroma S/p left  frontal burr hole for drainage of subdural hematoma w/ Dr Claudene performed 10/06/24  Neurosurgery following outpatient  Hold ASA Keppra 500 mg po q12h x1 week  HTN Goal SBP <160  Follow outpatient   Type 2 diabetes Carb diet resume home glimepiride , Rybelsus   GERD Pantoprazole  40 mg po daily    Hyperlipidemia Follow outpatient   Hypothryoid Synthroid     Overweight/Class 1 obesity based on BMI: Body mass index is 29.24 kg/m.SABRA Significantly low or high BMI is associated with higher medical risk.  Underweight - under 18  overweight - 25 to 29 obese - 30 or more Class 1 obesity: BMI of 30.0 to 34 Class 2 obesity: BMI of 35.0 to 39 Class 3 obesity: BMI of 40.0 to 49 Super Morbid Obesity: BMI 50-59 Super-super Morbid Obesity: BMI 60+ Healthy nutrition and physical activity advised as adjunct to other disease management and risk reduction treatments              Discharge Instructions  Allergies as of 10/08/2024   No Known Allergies      Medication List     STOP taking these medications    aspirin  EC 81 MG tablet   metFORMIN  1000 MG tablet Commonly known as: GLUCOPHAGE        TAKE these medications    Coenzyme Q10 100 MG Tabs Take 100 mg by mouth every other day.   glimepiride  4 MG tablet Commonly known as: AMARYL  Take 4 mg by mouth daily with breakfast.  levETIRAcetam 500 MG 24 hr tablet Commonly known as: KEPPRA XR Take 1 tablet (500 mg total) by mouth 2 (two) times daily for 13 doses. Take first dose evening 10/08/24 then continue twice daily 10/09/24   levothyroxine  75 MCG tablet Commonly known as: SYNTHROID  Take 75 mcg by mouth daily before breakfast.   Metoprolol  Tartrate 37.5 MG Tabs Take 37.5 mg by mouth every 12 (twelve) hours.   multivitamin with minerals Tabs tablet Take 1 tablet by mouth daily. Centrum   pantoprazole  40 MG tablet Commonly known as: PROTONIX  Take 40 mg by mouth daily.   Rybelsus 14 MG Tabs Generic  drug: Semaglutide Take 1 tablet by mouth daily.               Discharge Care Instructions  (From admission, onward)           Start     Ordered   10/08/24 0000  Discharge wound care:       Comments: See instructions per neurosurgeon   10/08/24 1629             Follow-up Information     Sadie Manna, MD. Go on 10/15/2024.   Specialty: Internal Medicine Why: @ 12pm  hospital follow up Contact information: [address] [place] [address] [phone]         Claudene Penne ORN, MD. Go to.   Specialty: Neurosurgery Why: Office appt area closed for today, they have patients phone # and they will call patient to set up appt  [facility] follow up Contact information: [address] [place] [address] [phone]                 No Known Allergies   Subjective: pt feeling well this morning, no headache, feeling stronger today, no focal weakness, requesting for DC home asap!    Discharge Exam: BP 128/75 (BP Location: Left Arm)   Pulse 78   Temp 97.6 F (36.4 C) (Oral)   Resp 20   Ht 5' 9 (1.753 m)   Wt 89.8 kg   SpO2 96%   BMI 29.24 kg/m  General: Pt is alert, awake, not in acute distress Cardiovascular: RRR, S1/S2 +, no rubs, no gallops Respiratory: CTA bilaterally, no wheezing, no rhonchi Abdominal: Soft, NT, ND, bowel sounds + Extremities: no edema, no cyanosis     The results of significant diagnostics from this hospitalization (including imaging, microbiology, ancillary and laboratory) are listed below for reference.     Microbiology: Recent Results (from the past 240 hours)  MRSA Next Gen by PCR, Nasal     Status: None   Collection Time: 10/05/24 10:37 AM   Specimen: Urine, Clean Catch; Nasal Swab  Result Value Ref Range Status   MRSA by PCR Next Gen NOT DETECTED NOT DETECTED Final    Comment: (NOTE) The GeneXpert MRSA Assay (FDA approved for NASAL  specimens only), is one component of a comprehensive MRSA colonization surveillance program. It is not intended to diagnose MRSA infection nor to guide or monitor treatment for MRSA infections. Test performance is not FDA approved in patients less than 67 years old. Performed at [facility], [address] [place], [place] [address]      Labs: BNP (last 3 results) Recent Labs    03/01/24 0132  BNP 103.8*   Basic Metabolic Panel: Recent Labs  Lab 10/05/24 0918 10/05/24 1728 10/06/24 0246  NA 139  --  139  K 4.0  4.3 4.0  CL 104  --  104  CO2 25  --  22  GLUCOSE 122*  --  118*  BUN 19  --  13  CREATININE 0.94  --  0.77  CALCIUM  9.2  --  8.9  MG  --  1.8  --    Liver Function Tests: Recent Labs  Lab 10/05/24 0918  AST 24  ALT 12  ALKPHOS 73  BILITOT 0.7  PROT 6.8  ALBUMIN 3.9   No results for input(s): LIPASE, AMYLASE in the last 168 hours. No results for input(s): AMMONIA in the last 168 hours. CBC: Recent Labs  Lab 10/05/24 0918 10/06/24 0246  WBC 5.7 7.5  NEUTROABS 3.1  --   HGB 12.3* 13.4  HCT 37.4* 40.3  MCV 90.1 88.0  PLT 175 189   Cardiac Enzymes: No results for input(s): CKTOTAL, CKMB, CKMBINDEX, TROPONINI in the last 168 hours. BNP: Invalid input(s): POCBNP CBG: Recent Labs  Lab 10/07/24 1112 10/07/24 1722 10/07/24 2214 10/08/24 0716 10/08/24 1327  GLUCAP 173* 156* 266* 154* 164*   D-Dimer No results for input(s): DDIMER in the last 72 hours. Hgb A1c No results for input(s): HGBA1C in the last 72 hours. Lipid Profile No results for input(s): CHOL, HDL, LDLCALC, TRIG, CHOLHDL, LDLDIRECT in the last 72 hours. Thyroid  function studies No results for input(s): TSH, T4TOTAL, T3FREE, THYROIDAB in the last 72 hours.  Invalid input(s): FREET3 Anemia work up No results for input(s): VITAMINB12, FOLATE, FERRITIN, TIBC, IRON, RETICCTPCT in the last 72 hours. Urinalysis     Component Value Date/Time   COLORURINE YELLOW (A) 10/05/2024 1048   APPEARANCEUR CLEAR (A) 10/05/2024 1048   LABSPEC 1.019 10/05/2024 1048   PHURINE 6.0 10/05/2024 1048   GLUCOSEU NEGATIVE 10/05/2024 1048   HGBUR NEGATIVE 10/05/2024 1048   BILIRUBINUR NEGATIVE 10/05/2024 1048   KETONESUR NEGATIVE 10/05/2024 1048   PROTEINUR NEGATIVE 10/05/2024 1048   NITRITE NEGATIVE 10/05/2024 1048   LEUKOCYTESUR NEGATIVE 10/05/2024 1048   Sepsis Labs Recent Labs  Lab 10/05/24 0918 10/06/24 0246  WBC 5.7 7.5   Microbiology Recent Results (from the past 240 hours)  MRSA Next Gen by PCR, Nasal     Status: None   Collection Time: 10/05/24 10:37 AM   Specimen: Urine, Clean Catch; Nasal Swab  Result Value Ref Range Status   MRSA by PCR Next Gen NOT DETECTED NOT DETECTED Final    Comment: (NOTE) The GeneXpert MRSA Assay (FDA approved for NASAL specimens only), is one component of a comprehensive MRSA colonization surveillance program. It is not intended to diagnose MRSA infection nor to guide or monitor treatment for MRSA infections. Test performance is not FDA approved in patients less than 97 years old. Performed at [facility], [address] [place], [place] [address]    Imaging CT HEAD WO CONTRAST ( ) Result Date: 10/06/2024 EXAM: CT HEAD WITHOUT CONTRAST 10/06/2024 01:15:17 PM TECHNIQUE: CT of the head was performed without the administration of intravenous contrast. Automated exposure control, iterative reconstruction, and/or weight based adjustment of the mA/kV was utilized to reduce the radiation dose to as low as reasonably achievable. COMPARISON: 10/05/2024 CLINICAL HISTORY: Status post subdural hematoma evacuation FINDINGS: BRAIN AND VENTRICLES: Small volume hemorrhage in the left sylvian fissure is new. Decreased left cerebral convexity subdural hematoma measuring 22 mm in thickness, previously 28 mm. Gas within the subdural space. 5 mm rightward midline shift,  previously 9 mm. Chronic white matter microvascular ischemic changes. No evidence of acute infarct. No hydrocephalus.  ORBITS: No acute abnormality. SINUSES: No acute abnormality. SOFT TISSUES AND SKULL: Interval left frontal burr hole drainage of subdural hematoma. Left frontal burr hole. No skull fracture. IMPRESSION: 1. Interval decrease in left cerebral convexity subdural hematoma thickness to 22 mm from 28 mm, with postoperative gas within the subdural space. 2. New small volume hemorrhage in the left sylvian fissure. 3. Improved rightward midline shift measuring 5 mm, previously 9 mm. 4. Left frontal burr hole. Electronically signed by: Lonni Necessary MD 10/06/2024 02:29 PM EST RP Workstation: [id number]   CT HEAD WO CONTRAST ( ) Result Date: 10/05/2024 EXAM: CT HEAD WITHOUT CONTRAST 10/05/2024 08:45:36 AM TECHNIQUE: CT of the head was performed without the administration of intravenous contrast. Automated exposure control, iterative reconstruction, and/or weight based adjustment of the mA/kV was utilized to reduce the radiation dose to as low as reasonably achievable. COMPARISON: 08/10/2024 CLINICAL HISTORY: Subdural fluid collection FINDINGS: BRAIN AND VENTRICLES: Redemonstrated large extra-axial collection along the left cerebral convexity, measures up to 2.8 cm in thickness on coronal images series 4 image 28. This collection previously measured approximately 10 mm in thickness. On oblique axial images, the collection measures up to 2.7 cm in thickness, previously measuring approximately 9 mm. There are layering hyperattenuating blood products posteriorly compatible with acute hemorrhage with additional areas of hypo to isoattenuating blood products anteriorly, findings suggestive of acute on chronic subdural hematoma. There is significant interval increase in mass effect on the left cerebral hemisphere with sulcal effacement throughout the left cerebral hemisphere. There is 9 mm rightward  midline shift at the level of the lateral ventricles, previously 2 mm. There is increased effacement of the left lateral ventricle. No hydrocephalus. No acute hemorrhage. No evidence of parenchymal hemorrhage. No evidence of intraventricular hemorrhage. No evidence of acute infarct. Similar appearance of chronic microvascular ischemic changes. ORBITS: No acute abnormality. SINUSES: Mild mucosal thickening in ethmoid air cells. SOFT TISSUES AND SKULL: No acute soft tissue abnormality. No skull fracture. IMPRESSION: 1. Significant interval enlargement of acute on chronic left cerebral convexity subdural hematoma, now up to 2.8 cm. 2. Associated marked interval increase in mass effect with left hemispheric sulcal effacement and 9 mm rightward midline shift. 3. Chronic microvascular ischemic changes, similar to prior. Electronically signed by: Donnice Mania MD 10/05/2024 09:00 AM EST RP Workstation: [id number]      Time coordinating discharge: over 30 minutes  SIGNED:  Vonda Harth DO [facility]

## 2024-10-08 NOTE — Plan of Care (Signed)
   Problem: Education: Goal: Ability to describe self-care measures that may prevent or decrease complications (Diabetes Survival Skills Education) will improve Outcome: Progressing Goal: Individualized Educational Video(s) Outcome: Progressing

## 2024-10-08 NOTE — Discharge Instructions (Signed)
 NEUROSURGERY DISCHARGE INSTRUCTIONS  Admission diagnosis: Subdural hematoma (HCC) [S06.5XAA] Weakness [R53.1] Acute expansion of chronic intracranial subdural hematoma (HCC) [I62.01, I62.03]  Operative procedure: burrhole for subdural hematoma washout   What to do after you leave the hospital:  Recommended diet: regular diet. Increase protein intake to promote wound healing.  Recommended activity: no lifting, driving, or strenuous exercise for 4 weeks. You should walk multiple times per day  Special Instructions  No straining, no heavy lifting > 10lbs x 4 weeks.  Keep incision area clean and dry. May shower in 2 days. No baths or pools for 6 weeks.  Please remove dressing tomorrow, no need to apply a bandage afterwards  You have sutures or staples that will be removed in clinic.   Please take pain medications as directed. Take a stool softener if on pain medications  *Regarding compression stockings-  Please wear day and night until you are walking a couple hundred feet three times a day.   Please Report any of the following: Nausea or Vomiting, Temperature is greater than 101.11F (38.1C) degrees, Dizziness, Abdominal Pain, Difficulty Breathing or Shortness of Breath, Inability to Eat, drink Fluids, or Take medications, Bleeding, swelling, or drainage from surgical incision sites, New numbness or weakness, and Bowel or bladder dysfunction to the neurosurgeon on call. How to contact us :  If you have any questions/concerns before or after surgery, you can reach us  at [phone], or you can send a mychart message. We can be reached by phone or mychart 8am-4pm, Monday-Friday.  *Please note: Calls after 4pm are forwarded to a third party answering service. Mychart messages are not routinely monitored during evenings, weekends, and holidays. Please call our office to contact the answering service for urgent concerns during non-business hours.   Additional Follow up appointments Please follow  up with Lyle Decamp PA-C as scheduled in 2-3 weeks   Please see below for scheduled appointments:  Future Appointments  Date Time Provider Department Center  10/15/2024  1:00 PM Decamp Lyle, PA-C [facility]  11/09/2024 10:45 AM Claudene Penne ORN, MD [facility]  12/24/2024  2:00 PM Decamp Lyle, PA-C [facility]

## 2024-10-09 NOTE — Telephone Encounter (Signed)
Patient's wife notified and voiced understanding.

## 2024-10-15 ENCOUNTER — Encounter: Payer: Self-pay | Admitting: Physician Assistant

## 2024-10-15 ENCOUNTER — Ambulatory Visit (INDEPENDENT_AMBULATORY_CARE_PROVIDER_SITE_OTHER): Admitting: Physician Assistant

## 2024-10-15 VITALS — BP 132/84 | Temp 98.0°F | Wt 197.0 lb

## 2024-10-15 DIAGNOSIS — S065XAD Traumatic subdural hemorrhage with loss of consciousness status unknown, subsequent encounter: Secondary | ICD-10-CM

## 2024-10-15 DIAGNOSIS — S065XAA Traumatic subdural hemorrhage with loss of consciousness status unknown, initial encounter: Secondary | ICD-10-CM

## 2024-10-15 DIAGNOSIS — Z09 Encounter for follow-up examination after completed treatment for conditions other than malignant neoplasm: Secondary | ICD-10-CM

## 2024-10-15 DIAGNOSIS — I10 Essential (primary) hypertension: Secondary | ICD-10-CM | POA: Diagnosis not present

## 2024-10-15 DIAGNOSIS — S065X0A Traumatic subdural hemorrhage without loss of consciousness, initial encounter: Secondary | ICD-10-CM | POA: Diagnosis not present

## 2024-10-15 DIAGNOSIS — E119 Type 2 diabetes mellitus without complications: Secondary | ICD-10-CM | POA: Diagnosis not present

## 2024-10-15 NOTE — Progress Notes (Signed)
   REFERRING PHYSICIAN:  Sadie Manna, Md [address] [place],  [place] [address]  DOS: 10/06/24 Left side burr hole evacuation of SDH  HISTORY OF PRESENT ILLNESS: Jason JAYSON Sherlynn Mickey. is 9 days status post left-sided bur hole evacuation for subdural hematoma. Overall, he is doing very well.  He feels as though he has made a tremendous turnaround.  His word finding and speech have improved.  His strength has also improved and he is very pleased.     PHYSICAL EXAMINATION:  NEUROLOGICAL:  General: In no acute distress.   Awake, alert, oriented to person, place, and time.  Pupils equal round and reactive to light.  Facial tone is symmetric.    Strength:  At least 4/5 strength to bilateral upper and lower extremities.  No pronator drift.  Cranial nerves grossly intact. Incision c/d/I.  Staples removed today while in clinic.  Imaging:  EXAM: CT HEAD WITHOUT CONTRAST 10/08/2024 08:59:46 AM   TECHNIQUE: CT of the head was performed without the administration of intravenous contrast. Automated exposure control, iterative reconstruction, and/or weight based adjustment of the mA/kV was utilized to reduce the radiation dose to as low as reasonably achievable.   COMPARISON: CT of the head dated 10/06/2024.   CLINICAL HISTORY: evaluate for any changes in subdural hematoma   FINDINGS:   BRAIN AND VENTRICLES: Left holo-hemispheric subdural hematoma. There is no definite interval change. The subdural fluid and gas collection again measures up to 2.1 cm in transverse diameter on the coronal reformations. There is effacement of the left frontal sulci. There is persistent shift of the midline structures to the right by approximately 5 mm. There continues to be moderate pneumocephalus. No acute hemorrhage. No evidence of acute infarct. No hydrocephalus.   ORBITS: No acute abnormality.   SINUSES: No acute abnormality.   SOFT TISSUES AND SKULL: Status  post left parietal craniotomy. No acute soft tissue abnormality.   IMPRESSION: 1. Status post left parietal craniotomy for evaluation of a left holo-hemispheric subdural hematoma. 2. No definite interval change in the subdural fluid and gas collection, measuring up to 2.1 cm in transverse diameter on the coronal reformations. 3. Effacement of the left frontal sulci and persistent shift of the midline structures to the right by approximately 5 mm.    Assessment / Plan: Jason JAYSON Sherlynn Mickey. is doing well after left sided bur hole evacuation for subdural hemorrhage on 10/06/2024.  He is doing extremely well.  He feels as though he is made a lot of progress since before surgery.  He is strength, speech, and word finding have all improved.  Staples were removed today and he tolerated well.  Plan to see back in approximately 1 month.  Red flag symptoms were reviewed with the patient and his wife.  If he were to have any changes in neurologic symptoms I have asked that they notify us  immediately and had to nearest ED.    Advised to contact the office if any questions or concerns arise.   Lyle Decamp PA-C Dept of Neurosurgery

## 2024-10-22 DIAGNOSIS — Z23 Encounter for immunization: Secondary | ICD-10-CM | POA: Diagnosis not present

## 2024-10-22 DIAGNOSIS — I9789 Other postprocedural complications and disorders of the circulatory system, not elsewhere classified: Secondary | ICD-10-CM | POA: Diagnosis not present

## 2024-10-22 DIAGNOSIS — I2510 Atherosclerotic heart disease of native coronary artery without angina pectoris: Secondary | ICD-10-CM | POA: Diagnosis not present

## 2024-10-22 DIAGNOSIS — R079 Chest pain, unspecified: Secondary | ICD-10-CM | POA: Diagnosis not present

## 2024-10-22 DIAGNOSIS — Z951 Presence of aortocoronary bypass graft: Secondary | ICD-10-CM | POA: Diagnosis not present

## 2024-10-22 DIAGNOSIS — I493 Ventricular premature depolarization: Secondary | ICD-10-CM | POA: Diagnosis not present

## 2024-10-22 DIAGNOSIS — I10 Essential (primary) hypertension: Secondary | ICD-10-CM | POA: Diagnosis not present

## 2024-10-22 DIAGNOSIS — Z9889 Other specified postprocedural states: Secondary | ICD-10-CM | POA: Diagnosis not present

## 2024-10-22 DIAGNOSIS — Z955 Presence of coronary angioplasty implant and graft: Secondary | ICD-10-CM | POA: Diagnosis not present

## 2024-11-02 ENCOUNTER — Ambulatory Visit
Admission: RE | Admit: 2024-11-02 | Discharge: 2024-11-02 | Disposition: A | Source: Ambulatory Visit | Attending: Neurosurgery | Admitting: Neurosurgery

## 2024-11-02 DIAGNOSIS — S065XAA Traumatic subdural hemorrhage with loss of consciousness status unknown, initial encounter: Secondary | ICD-10-CM

## 2024-11-09 ENCOUNTER — Ambulatory Visit: Admitting: Neurosurgery

## 2024-11-09 VITALS — BP 190/92 | Temp 97.8°F | Ht 69.0 in | Wt 197.0 lb

## 2024-11-09 DIAGNOSIS — S065XAD Traumatic subdural hemorrhage with loss of consciousness status unknown, subsequent encounter: Secondary | ICD-10-CM

## 2024-11-09 DIAGNOSIS — S065XAA Traumatic subdural hemorrhage with loss of consciousness status unknown, initial encounter: Secondary | ICD-10-CM

## 2024-11-09 NOTE — Progress Notes (Unsigned)
   REFERRING PHYSICIAN:  Sadie Manna, Md [address] [place],  [place] [address]  DOS: 10/06/24 Left side burr hole evacuation of SDH  HISTORY OF PRESENT ILLNESS: Jason JAYSON Sherlynn Mickey. is 9 days status post left-sided bur hole evacuation for subdural hematoma. Overall, he is doing very well.  He feels as though he has made a tremendous turnaround.  His word finding and speech have improved.  His strength has also improved and he is very pleased.     PHYSICAL EXAMINATION:  NEUROLOGICAL:  General: In no acute distress.   Awake, alert, oriented to person, place, and time.  Pupils equal round and reactive to light.  Facial tone is symmetric.    Strength:  At least 4/5 strength to bilateral upper and lower extremities.  No pronator drift.  Cranial nerves grossly intact. Incision c/d/I.  Staples removed today while in clinic.  Imaging:  EXAM: CT HEAD WITHOUT CONTRAST 10/08/2024 08:59:46 AM   TECHNIQUE: CT of the head was performed without the administration of intravenous contrast. Automated exposure control, iterative reconstruction, and/or weight based adjustment of the mA/kV was utilized to reduce the radiation dose to as low as reasonably achievable.   COMPARISON: CT of the head dated 10/06/2024.   CLINICAL HISTORY: evaluate for any changes in subdural hematoma   FINDINGS:   BRAIN AND VENTRICLES: Left holo-hemispheric subdural hematoma. There is no definite interval change. The subdural fluid and gas collection again measures up to 2.1 cm in transverse diameter on the coronal reformations. There is effacement of the left frontal sulci. There is persistent shift of the midline structures to the right by approximately 5 mm. There continues to be moderate pneumocephalus. No acute hemorrhage. No evidence of acute infarct. No hydrocephalus.   ORBITS: No acute abnormality.   SINUSES: No acute abnormality.   SOFT TISSUES AND SKULL: Status  post left parietal craniotomy. No acute soft tissue abnormality.   IMPRESSION: 1. Status post left parietal craniotomy for evaluation of a left holo-hemispheric subdural hematoma. 2. No definite interval change in the subdural fluid and gas collection, measuring up to 2.1 cm in transverse diameter on the coronal reformations. 3. Effacement of the left frontal sulci and persistent shift of the midline structures to the right by approximately 5 mm.    Assessment / Plan: Jason JAYSON Sherlynn Mickey. is doing well after left sided bur hole evacuation for subdural hemorrhage on 10/06/2024.  He is doing extremely well.  He feels as though he is made a lot of progress since before surgery.  He is strength, speech, and word finding have all improved.  Staples were removed today and he tolerated well.  Plan to see back in approximately 1 month.  Red flag symptoms were reviewed with the patient and his wife.  If he were to have any changes in neurologic symptoms I have asked that they notify us  immediately and had to nearest ED.    Advised to contact the office if any questions or concerns arise.   Lyle Decamp PA-C Dept of Neurosurgery

## 2024-12-24 ENCOUNTER — Encounter: Admitting: Physician Assistant

## 2024-12-25 ENCOUNTER — Telehealth: Payer: Self-pay | Admitting: Physician Assistant

## 2024-12-25 ENCOUNTER — Ambulatory Visit

## 2024-12-25 ENCOUNTER — Encounter: Payer: Self-pay | Admitting: Physician Assistant

## 2024-12-25 ENCOUNTER — Ambulatory Visit: Admitting: Physician Assistant

## 2024-12-25 VITALS — BP 142/80 | Temp 98.1°F | Wt 202.4 lb

## 2024-12-25 DIAGNOSIS — M25562 Pain in left knee: Secondary | ICD-10-CM

## 2024-12-25 DIAGNOSIS — S065XAD Traumatic subdural hemorrhage with loss of consciousness status unknown, subsequent encounter: Secondary | ICD-10-CM

## 2024-12-25 DIAGNOSIS — W19XXXA Unspecified fall, initial encounter: Secondary | ICD-10-CM

## 2024-12-25 DIAGNOSIS — W19XXXD Unspecified fall, subsequent encounter: Secondary | ICD-10-CM

## 2024-12-25 DIAGNOSIS — S065XAA Traumatic subdural hemorrhage with loss of consciousness status unknown, initial encounter: Secondary | ICD-10-CM

## 2024-12-26 ENCOUNTER — Ambulatory Visit: Payer: Self-pay | Admitting: Physician Assistant

## 2024-12-27 ENCOUNTER — Ambulatory Visit

## 2024-12-27 ENCOUNTER — Other Ambulatory Visit: Payer: Self-pay | Admitting: Physician Assistant

## 2024-12-27 VITALS — BP 170/78 | HR 63 | Ht 69.0 in | Wt 197.0 lb

## 2024-12-27 DIAGNOSIS — M25562 Pain in left knee: Secondary | ICD-10-CM

## 2024-12-27 DIAGNOSIS — S83411A Sprain of medial collateral ligament of right knee, initial encounter: Secondary | ICD-10-CM

## 2024-12-27 NOTE — Progress Notes (Signed)
 "  Office Visit Note   Patient: Jason Mclean.           Date of Birth: October 24, 1946           MRN: [id number] Visit Date: 12/27/2024              Requested by: Ulis Bottcher, PA-C [address] [place],  [place] [address] PCP: Sadie Manna, MD   Assessment & Plan: Visit Diagnoses:  1. Sprain of medial collateral ligament of right knee, initial encounter   2. Acute pain of left knee     Plan: Natural history and expected course discussed. Questions answered. Recommend rest and ice. PT referral for knee strengthening and ROM. Recommend acetaminophen  as needed for pain. Hinged knee brace. Follow up in 6 weeks.  Orders:  No orders of the defined types were placed in this encounter.    Subjective: Chief Complaint: Left knee pain  HPI Patient is a 79 y.o. year old male who presents with a knee injury involving the  left knee. Onset of the symptoms was 1 week ago. Inciting event: injured during a fall on the ice. Patient slipped and fell and knee bent behind him . Current symptoms include giving out and pain located on the inside of the knee. Pain is aggravated by any weight bearing.   Treatment to date: none.  Objective: Vital Signs: BP (!) 170/78   Pulse 63   Ht 5' 9 (1.753 m)   Wt 89.4 kg   BMI 29.09 kg/m   Physical Exam Gen: Alert, No Acute Distress left knee: Skin intact, no erythema or induration noted. MCL  tenderness to palpation. Range of motion 0 to 100. No instability with varus or valgus stress.  Imaging: Radiographs personally reviewed by me; reveal no acute abnormalities of the left knee   PMFS History: Patient Active Problem List   Diagnosis Date Noted   Weakness 10/06/2024   Subdural hematoma (HCC) 10/06/2024   Acute expansion of chronic intracranial subdural hematoma (HCC) 10/05/2024   Syncope 06/14/2024   Subdural fluid collection 06/14/2024   Hypertension 03/03/2024   Hyperlipidemia 03/03/2024   Hypothyroidism 03/03/2024    Coronary artery disease 03/01/2024   CAD (coronary artery disease) 02/29/2024   Uncontrolled type 2 diabetes mellitus with hyperglycemia, without long-term current use of insulin  (HCC) 01/22/2024   CAD S/P percutaneous coronary angioplasty 01/22/2024   Chronic obstructive pulmonary disease (COPD) (HCC) 01/22/2024   Hypertensive urgency 01/22/2024   Unstable angina (HCC) 01/29/2020   NSTEMI (non-ST elevated myocardial infarction) (HCC) 06/15/2018   Past Medical History:  Diagnosis Date   Coronary artery disease    s/p three coronary stents   Diabetes mellitus without complication (HCC)    Hypercholesterolemia    Hypertension    Hypothyroidism    Testicular cancer (HCC)     Family History  Problem Relation Age of Onset   Strabismus Mother    Stroke Mother    CAD Father     Past Surgical History:  Procedure Laterality Date   BURR HOLE Left 10/06/2024   Procedure: Left Side [facility] Evacuation of SDH;  Surgeon: Claudene Penne ORN, MD;  Location: [facility];  Service: Neurosurgery;  Laterality: Left;   CATARACT EXTRACTION W/PHACO Left 01/27/2022   Procedure: CATARACT EXTRACTION PHACO AND INTRAOCULAR LENS PLACEMENT (IOC) LEFT DIABETIC 6.17 01:17.1;  Surgeon: Mittie Gaskin, MD;  Location: [facility];  Service: Ophthalmology;  Laterality: Left;  Diabetic   CATARACT EXTRACTION W/PHACO Right 02/10/2022  Procedure: CATARACT EXTRACTION PHACO AND INTRAOCULAR LENS PLACEMENT (IOC) RIGHT DIABETIC 10.48 01:19.8;  Surgeon: Mittie Gaskin, MD;  Location: [facility];  Service: Ophthalmology;  Laterality: Right;  Diabetic   CORONARY ANGIOPLASTY WITH STENT PLACEMENT     CORONARY BALLOON ANGIOPLASTY N/A 01/29/2020   Procedure: CORONARY STENT INTERVENTION;  Surgeon: Ammon Blunt, MD;  Location: [facility];  Service: Cardiovascular;  Laterality: N/A;   CORONARY STENT INTERVENTION N/A 06/16/2018   Procedure: CORONARY STENT INTERVENTION;  Surgeon: Ammon Blunt, MD;  Location: [facility];  Service: Cardiovascular;  Laterality: N/A;   LEFT HEART CATH AND CORONARY ANGIOGRAPHY N/A 06/16/2018   Procedure: LEFT HEART CATH AND CORONARY ANGIOGRAPHY;  Surgeon: Ammon Blunt, MD;  Location: [facility];  Service: Cardiovascular;  Laterality: N/A;   LEFT HEART CATH AND CORONARY ANGIOGRAPHY Left 01/29/2020   Procedure: LEFT HEART CATH AND CORONARY ANGIOGRAPHY;  Surgeon: Ammon Blunt, MD;  Location: [facility];  Service: Cardiovascular;  Laterality: Left;   LEFT HEART CATH AND CORONARY ANGIOGRAPHY Left 02/29/2024   Procedure: LEFT HEART CATH AND CORONARY ANGIOGRAPHY;  Surgeon: Ammon Blunt, MD;  Location: [facility];  Service: Cardiovascular;  Laterality: Left;   Social History   Occupational History   Not on file  Tobacco Use   Smoking status: Former    Current packs/day: 0.00    Types: Cigarettes    Quit date: 2014    Years since quitting: 12.1   Smokeless tobacco: Never  Vaping Use   Vaping status: Never Used  Substance and Sexual Activity   Alcohol  use: Not Currently   Drug use: Never   Sexual activity: Not on file   Current Outpatient Medications  Medication Instructions   Coenzyme Q10 100 mg, Every other day   ezetimibe  (ZETIA ) 10 mg, Daily   glimepiride  (AMARYL ) 4 mg, Daily with breakfast   levothyroxine  (SYNTHROID ) 75 mcg, Daily before breakfast   Metoprolol  Tartrate 37.5 mg, Every 12 hours   Multiple Vitamin (MULTIVITAMIN WITH MINERALS) TABS tablet 1 tablet, Daily   pantoprazole  (PROTONIX ) 40 mg, Daily   RYBELSUS 14 MG TABS 1 tablet, Daily   Allergies as of 12/27/2024   (No Known Allergies)   "

## 2025-02-07 ENCOUNTER — Ambulatory Visit
